# Patient Record
Sex: Male | Born: 1950 | Race: White | Hispanic: No | Marital: Married | Smoking: Never smoker
Health system: Southern US, Community
[De-identification: ages and names within clinical notes are randomized; demographics above are authoritative.]

## PROBLEM LIST (undated history)

## (undated) DIAGNOSIS — M549 Dorsalgia, unspecified: Secondary | ICD-10-CM

## (undated) DIAGNOSIS — E119 Type 2 diabetes mellitus without complications: Secondary | ICD-10-CM

## (undated) DIAGNOSIS — N289 Disorder of kidney and ureter, unspecified: Secondary | ICD-10-CM

## (undated) DIAGNOSIS — C801 Malignant (primary) neoplasm, unspecified: Secondary | ICD-10-CM

## (undated) DIAGNOSIS — T751XXA Unspecified effects of drowning and nonfatal submersion, initial encounter: Secondary | ICD-10-CM

## (undated) DIAGNOSIS — Z9889 Other specified postprocedural states: Secondary | ICD-10-CM

## (undated) DIAGNOSIS — R112 Nausea with vomiting, unspecified: Secondary | ICD-10-CM

## (undated) DIAGNOSIS — N179 Acute kidney failure, unspecified: Secondary | ICD-10-CM

## (undated) DIAGNOSIS — E785 Hyperlipidemia, unspecified: Secondary | ICD-10-CM

## (undated) DIAGNOSIS — I10 Essential (primary) hypertension: Secondary | ICD-10-CM

## (undated) DIAGNOSIS — R351 Nocturia: Secondary | ICD-10-CM

## (undated) DIAGNOSIS — M2550 Pain in unspecified joint: Secondary | ICD-10-CM

## (undated) DIAGNOSIS — Z77090 Contact with and (suspected) exposure to asbestos: Secondary | ICD-10-CM

## (undated) DIAGNOSIS — I499 Cardiac arrhythmia, unspecified: Secondary | ICD-10-CM

## (undated) DIAGNOSIS — I4891 Unspecified atrial fibrillation: Secondary | ICD-10-CM

## (undated) DIAGNOSIS — M1990 Unspecified osteoarthritis, unspecified site: Secondary | ICD-10-CM

## (undated) HISTORY — PX: SALIVARY GLAND SURGERY: SHX768

## (undated) HISTORY — PX: OTHER SURGICAL HISTORY: SHX169

## (undated) HISTORY — PX: KNEE ARTHROSCOPY: SUR90

## (undated) HISTORY — PX: JOINT REPLACEMENT: SHX530

## (undated) HISTORY — DX: Hyperlipidemia, unspecified: E78.5

## (undated) HISTORY — DX: Disorder of kidney and ureter, unspecified: N28.9

## (undated) HISTORY — DX: Unspecified atrial fibrillation: I48.91

## (undated) HISTORY — DX: Cardiac arrhythmia, unspecified: I49.9

## (undated) HISTORY — PX: ELBOW SURGERY: SHX618

## (undated) HISTORY — DX: Type 2 diabetes mellitus without complications: E11.9

## (undated) HISTORY — PX: KNEE SURGERY: SHX244

## (undated) HISTORY — PX: HERNIA REPAIR: SHX51

## (undated) HISTORY — PX: COLONOSCOPY: SHX174

## (undated) HISTORY — PX: HAND SURGERY: SHX662

## (undated) HISTORY — PX: CARPAL TUNNEL RELEASE: SHX101

## (undated) HISTORY — DX: Essential (primary) hypertension: I10

---
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
## 1973-02-03 DIAGNOSIS — N179 Acute kidney failure, unspecified: Secondary | ICD-10-CM

## 1973-02-03 DIAGNOSIS — T751XXA Unspecified effects of drowning and nonfatal submersion, initial encounter: Secondary | ICD-10-CM

## 1973-02-03 HISTORY — DX: Acute kidney failure, unspecified: N17.9

## 1973-02-03 HISTORY — DX: Unspecified effects of drowning and nonfatal submersion, initial encounter: T75.1XXA

## 1973-02-03 HISTORY — PX: OTHER SURGICAL HISTORY: SHX169

## 1979-02-04 HISTORY — PX: VASECTOMY: SHX75

## 1997-05-16 ENCOUNTER — Ambulatory Visit (HOSPITAL_COMMUNITY): Admission: RE | Admit: 1997-05-16 | Discharge: 1997-05-16 | Payer: Self-pay | Admitting: Orthopaedic Surgery

## 1997-05-24 ENCOUNTER — Inpatient Hospital Stay (HOSPITAL_COMMUNITY): Admission: RE | Admit: 1997-05-24 | Discharge: 1997-05-25 | Payer: Self-pay | Admitting: Orthopaedic Surgery

## 1999-02-28 ENCOUNTER — Inpatient Hospital Stay (HOSPITAL_COMMUNITY): Admission: RE | Admit: 1999-02-28 | Discharge: 1999-03-04 | Payer: Self-pay | Admitting: Orthopaedic Surgery

## 1999-03-20 ENCOUNTER — Encounter: Admission: RE | Admit: 1999-03-20 | Discharge: 1999-04-24 | Payer: Self-pay | Admitting: Orthopaedic Surgery

## 1999-05-08 ENCOUNTER — Encounter: Admission: RE | Admit: 1999-05-08 | Discharge: 1999-08-06 | Payer: Self-pay | Admitting: *Deleted

## 2000-07-31 ENCOUNTER — Encounter: Payer: Self-pay | Admitting: Orthopaedic Surgery

## 2000-07-31 ENCOUNTER — Encounter: Admission: RE | Admit: 2000-07-31 | Discharge: 2000-07-31 | Payer: Self-pay | Admitting: Orthopaedic Surgery

## 2000-09-02 ENCOUNTER — Ambulatory Visit (HOSPITAL_COMMUNITY): Admission: RE | Admit: 2000-09-02 | Discharge: 2000-09-02 | Payer: Self-pay | Admitting: Orthopaedic Surgery

## 2000-09-02 ENCOUNTER — Encounter: Payer: Self-pay | Admitting: Orthopaedic Surgery

## 2000-09-03 ENCOUNTER — Encounter (INDEPENDENT_AMBULATORY_CARE_PROVIDER_SITE_OTHER): Payer: Self-pay | Admitting: *Deleted

## 2000-09-03 ENCOUNTER — Inpatient Hospital Stay (HOSPITAL_COMMUNITY): Admission: RE | Admit: 2000-09-03 | Discharge: 2000-09-06 | Payer: Self-pay | Admitting: Orthopaedic Surgery

## 2000-09-17 ENCOUNTER — Encounter: Admission: RE | Admit: 2000-09-17 | Discharge: 2000-12-16 | Payer: Self-pay | Admitting: Orthopaedic Surgery

## 2002-06-03 ENCOUNTER — Encounter: Admission: RE | Admit: 2002-06-03 | Discharge: 2002-06-03 | Payer: Self-pay | Admitting: Orthopaedic Surgery

## 2004-11-15 ENCOUNTER — Encounter: Admission: RE | Admit: 2004-11-15 | Discharge: 2005-02-13 | Payer: Self-pay | Admitting: Orthopaedic Surgery

## 2006-03-10 ENCOUNTER — Encounter: Payer: Self-pay | Admitting: Pulmonary Disease

## 2006-10-01 ENCOUNTER — Emergency Department (HOSPITAL_COMMUNITY): Admission: EM | Admit: 2006-10-01 | Discharge: 2006-10-01 | Payer: Self-pay | Admitting: Emergency Medicine

## 2006-10-16 ENCOUNTER — Emergency Department (HOSPITAL_COMMUNITY): Admission: EM | Admit: 2006-10-16 | Discharge: 2006-10-16 | Payer: Self-pay | Admitting: Emergency Medicine

## 2006-10-19 ENCOUNTER — Encounter: Admission: RE | Admit: 2006-10-19 | Discharge: 2006-11-02 | Payer: Self-pay | Admitting: Orthopaedic Surgery

## 2007-03-02 HISTORY — PX: COLONOSCOPY: SHX174

## 2008-02-04 DIAGNOSIS — C801 Malignant (primary) neoplasm, unspecified: Secondary | ICD-10-CM

## 2008-02-04 HISTORY — DX: Malignant (primary) neoplasm, unspecified: C80.1

## 2008-06-23 ENCOUNTER — Ambulatory Visit: Payer: Self-pay | Admitting: Pulmonary Disease

## 2008-06-23 DIAGNOSIS — E1169 Type 2 diabetes mellitus with other specified complication: Secondary | ICD-10-CM | POA: Insufficient documentation

## 2008-06-23 DIAGNOSIS — E785 Hyperlipidemia, unspecified: Secondary | ICD-10-CM | POA: Insufficient documentation

## 2008-06-23 DIAGNOSIS — N289 Disorder of kidney and ureter, unspecified: Secondary | ICD-10-CM | POA: Insufficient documentation

## 2008-06-23 DIAGNOSIS — I10 Essential (primary) hypertension: Secondary | ICD-10-CM | POA: Insufficient documentation

## 2008-06-23 DIAGNOSIS — I480 Paroxysmal atrial fibrillation: Secondary | ICD-10-CM | POA: Insufficient documentation

## 2008-06-23 DIAGNOSIS — Z77090 Contact with and (suspected) exposure to asbestos: Secondary | ICD-10-CM | POA: Insufficient documentation

## 2008-10-18 ENCOUNTER — Telehealth: Payer: Self-pay | Admitting: Pulmonary Disease

## 2008-10-30 ENCOUNTER — Ambulatory Visit: Payer: Self-pay | Admitting: Pulmonary Disease

## 2009-01-16 ENCOUNTER — Telehealth (INDEPENDENT_AMBULATORY_CARE_PROVIDER_SITE_OTHER): Payer: Self-pay | Admitting: *Deleted

## 2009-06-22 ENCOUNTER — Ambulatory Visit: Payer: Self-pay | Admitting: Pulmonary Disease

## 2009-06-25 ENCOUNTER — Telehealth: Payer: Self-pay | Admitting: Pulmonary Disease

## 2010-03-05 NOTE — Progress Notes (Signed)
Summary: results  Phone Note Call from Patient Call back at [phone]   Caller: Patient Call For: Lady Wisham Summary of Call: calling for xray results pls call after 9:00 Initial call taken by: Rickard Patience,  Jun 25, 2009 8:10 AM  Follow-up for Phone Call        pt advised. Carron Curie CMA  Jun 25, 2009 9:27 AM

## 2010-03-05 NOTE — Assessment & Plan Note (Signed)
Summary: rov for pulmonary asbestosis   Copy to:  Rudi Heap Primary Provider/Referring Provider:  Robley Fries  CC:  Yearly followup.  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  He is doing well from a breathing standpoint, and denies any change in his exercise tolerance.  He has minimal cough, no mucus, and no congestion.  He is eating well, and has not had any unexplained weight loss.  Current Medications (verified): 1)  Crestor 5 Mg Tabs (Rosuvastatin Calcium) .... Take 1/2 Tab By Mouth Once Daily 2)  Warfarin Sodium 2.5 Mg Tabs (Warfarin Sodium) .... Take 1 Tablet By Mouth Once A Day 3)  Sotalol Hcl 80 Mg Tabs (Sotalol Hcl) .... Take 1/2 Tab By Mouth Two Times A Day 4)  Diltiazem Hcl Cr 180 Mg Xr24h-Cap (Diltiazem Hcl) .[name].. 1 Once Daily 5)  Co Q-10 150 Mg Caps (Coenzyme Q10) .... Take 1 Tablet By Mouth Once A Day 6)  Cinnamon 500 Mg Caps (Cinnamon) .... Take 1 Tablet By Mouth Once A Day 7)  Multivitamins  Tabs (Multiple Vitamin) .... Take 1 Tablet By Mouth Once A Day 8)  Viagra 100 Mg Tabs (Sildenafil Citrate) .... As Needed  Allergies (verified): No Known Drug Allergies  Review of Systems       The patient complains of non-productive cough.  The patient denies shortness of breath with activity, shortness of breath at rest, productive cough, coughing up blood, chest pain, irregular heartbeats, acid heartburn, indigestion, loss of appetite, weight change, abdominal pain, difficulty swallowing, sore throat, tooth/dental problems, headaches, nasal congestion/difficulty breathing through nose, sneezing, itching, ear ache, anxiety, depression, hand/feet swelling, joint stiffness or pain, rash, change in color of mucus, and fever.    Vital Signs:  Patient profile:   60 year old male Weight:      195 pounds BMI:     26.54 O2 Sat:      97 % on Room air Temp:     98.1 degrees F  oral Pulse rate:   68 / minute BP sitting:   112 / 70  (left arm)  Vitals Entered By: Vernie Murders (Jun 22, 2009 3:07 PM)  O2 Flow:  Room air  Physical Exam  General:  wd male in nad Lungs:  totally clear to auscultation Heart:  rrr, no mrg Extremities:  no edema noted, no cyanosis Neurologic:  alert and oriented, moves all 4.   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  Will check cxr today, and let him know the results.  He will f/u with me in one year, or sooner for worsening symptoms.  Medications Added to Medication List This Visit: 1)  Diltiazem Hcl Cr 180 Mg Xr24h-cap (Diltiazem hcl) .[name].. 1 once daily  Other Orders: Est. Patient Level II (91478) T-2 View CXR (71020TC)  Patient Instructions: 1)  will check cxr today and let you know the results 2)  followup with me in one year, but call if something changes.

## 2010-06-21 ENCOUNTER — Ambulatory Visit: Payer: Self-pay | Admitting: Pulmonary Disease

## 2010-06-21 NOTE — Op Note (Signed)
[place]. [facility]  Patient:    Christopher Avery                       MRN: [id number] Proc. Date: 02/28/99 Adm. Date:  [id number] Attending:  Randolm Idol                           Operative Report  PREOPERATIVE DIAGNOSIS:  Osteoarthritis left knee, status post high tibial osteotomy.  POSTOPERATIVE DIAGNOSIS:  Osteoarthritis left knee, status post high tibial osteotomy.  PROCEDURE: 1. Removal of internal fixation plate. 2. Total knee replacement.  SURGEON:  Claude Manges. Cleophas Dunker, M.D.  ASSISTANT:  Arnoldo Morale, P.A.-C.  ANESTHESIA:  General orotracheal.  COMPLICATIONS:  None.  COMPONENTS:  Standard plus [place] femoral component, large rotating tibial platform and 10 mm polyethylene bridging bearing - cemented and cemented rotating Cruciform backed patella.  PROCEDURE:  The patient comfortable on the operating table and under general orotracheal anesthesia, the left lower extremity was placed in a thigh tourniquet. The leg was then prepped with a Duraprep from the thigh tourniquet to the ankle. Sterile draping was performed.  The previous midline longitudinal incision was utilized and extended proximally  over the patella and by sharp dissection carried down to subcutaneous tissue. he first layer of capsule was incised in the midline.  A medial parapatellar incision was then made with the Bovie.   There was probably 10 cc of clear yellow joint effusion.  The patellar tendon was somewhat scarred to the previous osteotomy site and it as minimally elevated so that I could evert the patella 180 degrees.  The knee was  then flexed 90 degrees.  There were large osteophytes off the medial and lateral femoral condyle and to some extent off the patella.  There was almost complete absence of articular cartilage with exposed subchondral bone along the medial compartment.  Both on the tibia, as well as, the femur.  Preoperatively, I had  templated a standard plus or a large femoral component. he standard plus component was confirmed intraoperatively.  The tibia measured either a standard plus or a large and a large was confirmed intraoperatively.  The appropriate jigs were then applied to the tibia to remove probably 4-5 mm of bone with a posterior inclination of about 5 degrees.  I did not encounter the internal fixation plate at that point.  The femoral jigs were then applied using 10 mm flexion/extension gaps, which were symmetrical.  Both ACL and PCL were sacrificed and a 4 degree distal femoral valgus cut was also utilized.  The finishing jig was applied to obtain the appropriate cuts to finish to apply the femoral component.  Retractors then placed behind the tibia.  There were osteophytes along the medial tibial plateau, which were removed with a rongeur and we had templated a large tibial tray.  As we were making the center cut, we encountered the screws from he internal fixation plate status post high tibial osteotomy and accordingly removed the plate and four screws through a lateral fascial incision.  This is performed without difficulty.  Further cuts were then made for the tibia.  The trial components were then inserted, including the large rotating tibial platform with a 10 mm bridging bearing and the standard plus femoral component. We had full extension, slight hyperextension, we could flex probably 125 degrees. The tibial component rotated in the midline without any malrotation.  There was no  opening with a varus or valgus stress.  The patella was then prepared by removing 10 mm of bone, leaving approximately 14 mm of patella thickness.  The cruciate backed jig was applied and the cruciate bone trough made to accept the patella.  We applied the patella, it fit flushly and then it was reduced.  The knee was placed through a full range of motion, there was no lateral subluxation.  All  the trial components were removed, the joint was then copiously irrigated with jet saline lavage and antibiotic solution.  Retractors were then placed about the tibia, the tibia was secured with poly methyl methacrylate, followed by the 10 m bridging bearing.  We press-fit the femur, placed the knee in full extension. Extraneous methacrylate was removed.  The patella was applied with cement and clamped until the cement had hardened.  Extraneous methacrylate was removed about the patella.  The patella clamp was removed after maturation of the methacrylate.  The knee was inspected without evidence of loose material.  It was again lavage with jet saline and antibiotic solution.  The tourniquet was deflated.  Gross bleeders were Bovie coagulated.  I felt we had a nice dry field at the end of the procedure and therefore, a Hemovac was not inserted.  The fasciotomy site laterally was closed with a running 0 Ethibond.   The medial capsule incision was closed with interrupted #1 Ethibond.  The first layer of capsule was closed with a running 0 Vicryl and the subcu closed with a running 2-0 Vicryl.  The skin was closed with skin clips.  Sterile bulky dressing was applied followed by an Ace bandage and  knee immobilizer.  There was excellent capillary refill to the toes.  Dr. Katrinka Blazing was to insert an epidural catheter for postoperative pain control. DD:  02/28/99 TD:  02/28/99 Job: [id number]

## 2010-06-21 NOTE — Op Note (Signed)
[place]. [facility]  Patient:    BERTHEL, BAGNALL                     MRN: [id number] Proc. Date: 09/03/00 Attending:  Claude Manges. Cleophas Dunker, M.D.                           Operative Report  PREOPERATIVE DIAGNOSIS:  Painful left total knee replacement.  POSTOPERATIVE DIAGNOSIS:  Painful left total knee replacement with probable painful femoral component.  OPERATION PERFORMED:  Revision of femoral component of left total knee replacement and synovectomy.  SURGEON:  Claude Manges. Cleophas Dunker, M.D.  ASSISTANT: 1. Georgena Spurling, M.D. 2. Jamelle Rushing, P.A.  ANESTHESIA:  General orotracheal.  COMPLICATIONS:  None.  DESCRIPTION OF PROCEDURE:  With the patient comfortable on the operating table and under general orotracheal anesthesia, the left lower extremity was placed in a thigh tourniquet.  The leg was then prepped with Betadine scrub and then DuraPrep from the tourniquet to the midfoot.  Sterile draping was performed. With the extremity still elevated, it was Esmarch exsanguinated with the proximal tourniquet 350 mmHg.  The previous incision was utilized by sharp dissection and carried down to subcutaneous tissues.  The first layer of capsule was incised in the midline. A medial parapatellar incision was then performed through the old incision and the previously inserted Tycron sutures were removed.  There was probably 15 to 20 cc of clear joint effusion.  There was quite a bit of scar tissue in the superior pouch.  This was released so that I could evert the patella 90 degrees.  The knee was then flexed 90 degrees.  There was really minimal synovitis showing no evidence of any obvious loosening or infection.  Samples of synovium were sent to pathology without evidence of acute inflammation with less than 5 white blood cells per high powered field.  I carefully removed more of the synovial tissue although it was not discolored, wasnt beefy red or gray  and there was just minimal inflammatory tissue.  We carefully probed the tibial component.  It was perfectly intact without evidence of any loosening.  There was no pannus formation or evidence of erosion of the bone.  We could place the [place] beneath the femoral component in several locations. The patient was painful along the medial femoral condyle preoperatively.  We felt that perhaps there was a fair amount of fibrous ingrowth causing the pain, so we elected to remove the femoral component. We undermined all of the edges with a thin osteotome and then removed the component without much difficulty.  There was minimal if any bone loss.  We decided to apply a new standard plus femoral component which is the size of the previously inserted component with methacrylate.  We used the trial and applied it.  There was very minimal toggling.  Using a 10 mm bridging bearing, we had good flexion and extension gaps that were symmetrical.  The joint was then copiously irrigated with jet saline lavage and antibiotic solution.  We then applied the new femoral component with polymethyl methacrylate after switching the polyethylene components using a new component on the tibia.  The extraneous methacrylate was removed with the knee placed in extension.  We did not feel we had lost any varus or valgus.  After complete maturation of methacrylate, a few areas of extraneous methacrylate were removed with an osteotome.  The knee was then placed  through a full range of motion. The patella tracked in the midline.  There was no opening to varus or valgus stress.  The tourniquet was deflated.  Bleeders were Bovie coagulated.  The Hemovac was inserted.  The deep capsule was closed with #1 Tycron. Superficial capsule closed with a running 0 Vicryl, the subcutaneous with 2-0 Vicryl and skin closed with skin clips.  The Hemovac was charged.  A sterile bulky dressing was applied, followed by an Ace bandage and a  knee immobilizer.   The patient was to receive a femoral nerve block postoperatively. DD:  09/03/00 TD:  09/03/00 Job: [id number]

## 2010-06-21 NOTE — Discharge Summary (Signed)
[place]. [facility]  Patient:    Christopher Avery                       MRN: [id number] Adm. Date:  [id number] Disc. Date: [id number] Attending:  Randolm Idol                           Discharge Summary  FINAL DIAGNOSES:  1. Osteoarthritis, left knee.  2. History of osteoarthritis of right knee, status post successful high     tibial osteotomy.  3. Gastroesophageal reflux disease.  4. 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  5. History of renal failure secondary to Streptococcal infection in 1975.  OPERATION/PROCEDURE:  On February 28, 1999 the patient underwent removal of internal fixation plate, left tibia status post high tibial osteotomy and total knee replacement.  COMPLICATIONS:  None.  CONSULTATIONS:  None.  HISTORY OF PRESENT ILLNESS:  This is a 60 year old white male who underwent high tibial osteotomy to his right knee in 1999 for medial compartment arthrosis.  He did well for several months but for the past nine months has had progressive pain to the knee to the point of compromise.  He has had a second opinion confirming the need for total knee replacement and thus is now admitted for that particular procedure.  HOSPITAL COURSE: The day of admission Mr. Luecke was taken to the operating room and underwent successful and uncomplicated left total knee replacement. The plate and screws in the previous osteotomy were removed without difficulty.  Postoperatively Mr. Trimmer did quite well.  An epidural catheter was inserted and he was able to gain considerable motion with the CPM machine such that he was able to flex his knee approximately 70 degrees within two days.  The epidural catheter was removed on postoperative day #2.  He continued to do well and progressed to the point that he was independent with his walker at the time of discharge on March 04, 1999.  He was afebrile throughout his hospital stay and vital  signs remained stable.  He denied shortness of breath, chest pain, or calf pain.  His left knee incision was clean and dry without evidence of infection.  DISCHARGE MEDICATIONS:  1. Percocet for pain.  2. Coumadin 5 mg.  DISPOSITION: The INR was to be determined weekly through the pharmacy department.  He was to receive home health physical therapy with a home health nurse and return to see me in the office in approximately two weeks. Prescriptions were also given for a three-in-one commode seat as well as the home health physical therapy.  He did receive a rehabilitation consultation but he had progressed so quickly that inpatient rehabilitation was not necessary.  LABORATORY DATA:  He had a noted abnormal EKG with left axis deviation, otherwise within normal limits.  Hemoglobin on February 26, 1999 was 14.4 and at the time of discharge was 11.3 with hematocrit of 32.2.  INR was 2.1.  CMET was within normal limits.  Urine was clear. DD:  04/28/99 TD:  04/29/99 Job: [id number]
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
## 2010-06-21 NOTE — Discharge Summary (Signed)
[place]. [facility]  Patient:    FUE, CERVENKA                      MRN: [id number] Adm. Date:  [id number] Disc. Date: [id number] Attending:  Randolm Idol Dictator:   Jamelle Rushing, P.A. CC:         Ernestina Penna, M.D., [place], [place]   Discharge Summary  ADMISSION DIAGNOSES: 1. 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 2. History of acute renal failure.  DISCHARGE DIAGNOSIS:  Revision of left knee femoral component of total knee arthroplasty.  HISTORY OF PRESENT ILLNESS:  The patient is a 60 year old, white male with a history of left total knee arthroplasty in January of 2001.  The patient has been having constant pain and swelling in his knee for about the last year. It has been worse over the last six to eight months.  The pain is located over the medial joint line in the femoral condyle.  It is a sharp shooting pain and radiates up the leg up into the upper body region.  The patient denies any specific injuries to the knee.  ALLERGIES:  No known drug allergies.  MEDICATIONS:  Vicodin p.r.n.  SURGICAL PROCEDURES:  On September 03, 2000, the patient was taken to the OR by Claude Manges. Cleophas Dunker, M.D., assisted by Georgena Spurling, M.D., and Jamelle Rushing, P.A.  Under general anesthesia, the patient underwent a revision of the femoral component of his left total knee arthroplasty and a synovectomy.  The patient tolerated this procedure well.  One Hemovac drain was left in place. The patient did receive a postoperative femoral nerve block for pain management.  CONSULTS:  On September 03, 2000, the following routine consults were requested: Physical therapy, occupational therapy, care management, and pharmacy for routine dosing of Coumadin for DVT prophylaxis.  HOSPITAL COURSE:  On September 03, 2000, the patient was admitted to [facility]. [facility] under the care of Claude Manges. Cleophas Dunker, M.D.  He was taken to the OR where a left  total knee arthroplasty, revision of the femoral component, and synovectomy were performed without any complications.  The patient then incurred a three-day postoperative course in which he progressed very nicely with physical therapy.  His pain was initially controlled very nicely with a PCA and he was transferred over to p.o. medications on postoperative day #2 and this managed his pain very well.  The patient had progressed very nicely with physical therapy and his wound remained benign. His vital signs were stable.  He was discharged on postoperative day #3 in excellent condition.  LABORATORY DATA:  Synovium sent from left knee intraoperatively pathology shows synovium of left knee biopsy reactive changes and less than 5 polymorphonuclear cells per high-power field.  The EKG on admission was normal sinus rhythm with sinus arrhythmia at 68 beats per minute.  The CBC on September 06, 2000, showed WBC 6.5, hemoglobin 12.3, hematocrit 35.4, and platelets 202.  Coagulation studies on September 06, 2000, showed PT 20.4 with a 2.1 INR.  Routine chemistries on September 06, 2000, where all within normal limits.  The urinalysis on admission was normal.  Wound cultures for aerobic and anaerobic showed no growth.  MEDICATIONS ON ORTHOPEDICS FLOOR: 1. Colace 100 mg p.o. b.i.d. 2. Oxycodone 10 mg p.o. q.12h. 3. Laxative or enema of choice p.r.n. 4. Reglan 10 mg p.o. q.8h. p.r.n. 5. Tylox 5 mg one or two tablets every four to  six hours p.r.n.    breakthrough pain. 6. Tylenol 650 mg p.o. q.4h. p.r.n. 7. Robaxin 500 mg p.o. q.6-8h. p.r.n. 8. Restoril 30 mg p.o. q.h.s. p.r.n. 9. Coumadin 2.5 mg p.o. q.d.  DISCHARGE INSTRUCTIONS: 1. Medications:  Percocet one or two tablets every four to six hours for pain    if needed, OxyContin CR 10 mg one tablet every 12 hours, Coumadin 2.5 mg a    day unless told differently by [employer]. 2. Activity:  Partial weightbearing 50% with the use of crutches. 3.  Diet:  No restrictions. 4. Wound care:  Change dressing daily or as needed.  Check for infection. 5. Special instructions:  [facility] for home health pro time and Coumadin    adjustments.  First pro time on Tuesday, September 08, 2000.  Follow up with    Claude Manges. Cleophas Dunker, M.D., in one week from discharge.  CONDITION ON DISCHARGE:  Excellent. DD:  09/13/00 TD:  09/13/00 Job: [id number]

## 2010-06-25 ENCOUNTER — Encounter: Payer: Self-pay | Admitting: Pulmonary Disease

## 2010-07-03 ENCOUNTER — Ambulatory Visit (INDEPENDENT_AMBULATORY_CARE_PROVIDER_SITE_OTHER): Payer: Worker's Compensation | Admitting: Pulmonary Disease

## 2010-07-03 ENCOUNTER — Ambulatory Visit (INDEPENDENT_AMBULATORY_CARE_PROVIDER_SITE_OTHER)
Admission: RE | Admit: 2010-07-03 | Discharge: 2010-07-03 | Disposition: A | Discharge status: Home / Self Care | Payer: Worker's Compensation | Source: Ambulatory Visit | Attending: Pulmonary Disease | Admitting: Pulmonary Disease

## 2010-07-03 ENCOUNTER — Encounter: Payer: Self-pay | Admitting: Pulmonary Disease

## 2010-07-03 VITALS — BP 120/80 | HR 56 | Temp 97.8°F | Ht 72.0 in | Wt 177.2 lb

## 2010-07-03 DIAGNOSIS — J61 Pneumoconiosis due to asbestos and other mineral fibers: Secondary | ICD-10-CM

## 2010-07-03 NOTE — Patient Instructions (Signed)
Will check cxr today, and will call you with results. followup with me in one year.

## 2010-07-03 NOTE — Progress Notes (Signed)
  Subjective:    [name]: Christopher Avery, male    DOB: 1950-07-19, 60 y.o.   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  He has no pulmonary symptoms currently, denying cough/congestion/mucus/dyspnea.  He feels his exertional tolerance is excellent.  He denies pleuritic cp, and has had no weight loss or anorexia.    Review of Systems  Constitutional: Negative for fever and unexpected weight change.  HENT: Negative for ear pain, nosebleeds, congestion, sore throat, rhinorrhea, sneezing, trouble swallowing, dental problem, postnasal drip and sinus pressure.   Eyes: Negative for redness and itching.  Respiratory: Negative for cough, chest tightness, shortness of breath and wheezing.   Cardiovascular: Negative for palpitations and leg swelling.  Gastrointestinal: Negative for nausea and vomiting.  Genitourinary: Negative for dysuria.  Musculoskeletal: Negative for joint swelling.  Skin: Negative for rash.  Neurological: Negative for headaches.  Hematological: Bruises/bleeds easily.  Psychiatric/Behavioral: Negative for dysphoric mood. The patient is not nervous/anxious.        Objective:   Physical Exam Wd male in nad Chest clear to auscultation, no wheezing Cor with rrr LE without edema, no cyanosis  Alert, moves all 4        Assessment & Plan:

## 2010-07-06 NOTE — Assessment & Plan Note (Signed)
The pt remains asymptomatic from a pulmonary standpoint.  He is due for his yearly cxr, and sometime in the future will recheck pfts for completeness.  He is to call if he develops any constitutional symptoms or pleuritic cp.  F/u will be in one year.

## 2011-02-10 DIAGNOSIS — I4891 Unspecified atrial fibrillation: Secondary | ICD-10-CM | POA: Diagnosis not present

## 2011-02-10 DIAGNOSIS — Z7901 Long term (current) use of anticoagulants: Secondary | ICD-10-CM | POA: Diagnosis not present

## 2011-02-11 DIAGNOSIS — R7309 Other abnormal glucose: Secondary | ICD-10-CM | POA: Diagnosis not present

## 2011-03-10 DIAGNOSIS — Z7901 Long term (current) use of anticoagulants: Secondary | ICD-10-CM | POA: Diagnosis not present

## 2011-03-10 DIAGNOSIS — I4891 Unspecified atrial fibrillation: Secondary | ICD-10-CM | POA: Diagnosis not present

## 2011-03-13 DIAGNOSIS — J019 Acute sinusitis, unspecified: Secondary | ICD-10-CM | POA: Diagnosis not present

## 2011-03-19 DIAGNOSIS — Z7901 Long term (current) use of anticoagulants: Secondary | ICD-10-CM | POA: Diagnosis not present

## 2011-03-19 DIAGNOSIS — I4891 Unspecified atrial fibrillation: Secondary | ICD-10-CM | POA: Diagnosis not present

## 2011-04-10 DIAGNOSIS — J019 Acute sinusitis, unspecified: Secondary | ICD-10-CM | POA: Diagnosis not present

## 2011-04-11 DIAGNOSIS — I4891 Unspecified atrial fibrillation: Secondary | ICD-10-CM | POA: Diagnosis not present

## 2011-04-11 DIAGNOSIS — Z7901 Long term (current) use of anticoagulants: Secondary | ICD-10-CM | POA: Diagnosis not present

## 2011-04-14 DIAGNOSIS — D235 Other benign neoplasm of skin of trunk: Secondary | ICD-10-CM | POA: Diagnosis not present

## 2011-04-14 DIAGNOSIS — D485 Neoplasm of uncertain behavior of skin: Secondary | ICD-10-CM | POA: Diagnosis not present

## 2011-04-14 DIAGNOSIS — C4359 Malignant melanoma of other part of trunk: Secondary | ICD-10-CM | POA: Diagnosis not present

## 2011-04-24 DIAGNOSIS — C4359 Malignant melanoma of other part of trunk: Secondary | ICD-10-CM | POA: Diagnosis not present

## 2011-04-24 DIAGNOSIS — D485 Neoplasm of uncertain behavior of skin: Secondary | ICD-10-CM | POA: Diagnosis not present

## 2011-05-16 DIAGNOSIS — Z7901 Long term (current) use of anticoagulants: Secondary | ICD-10-CM | POA: Diagnosis not present

## 2011-05-16 DIAGNOSIS — I4891 Unspecified atrial fibrillation: Secondary | ICD-10-CM | POA: Diagnosis not present

## 2011-05-28 DIAGNOSIS — M1710 Unilateral primary osteoarthritis, unspecified knee: Secondary | ICD-10-CM | POA: Diagnosis not present

## 2011-05-28 DIAGNOSIS — Z79899 Other long term (current) drug therapy: Secondary | ICD-10-CM | POA: Diagnosis not present

## 2011-05-28 DIAGNOSIS — N529 Male erectile dysfunction, unspecified: Secondary | ICD-10-CM | POA: Diagnosis not present

## 2011-05-28 DIAGNOSIS — I10 Essential (primary) hypertension: Secondary | ICD-10-CM | POA: Diagnosis not present

## 2011-05-28 DIAGNOSIS — J309 Allergic rhinitis, unspecified: Secondary | ICD-10-CM | POA: Diagnosis not present

## 2011-05-28 DIAGNOSIS — E7800 Pure hypercholesterolemia, unspecified: Secondary | ICD-10-CM | POA: Diagnosis not present

## 2011-05-28 DIAGNOSIS — R7309 Other abnormal glucose: Secondary | ICD-10-CM | POA: Diagnosis not present

## 2011-05-28 DIAGNOSIS — B009 Herpesviral infection, unspecified: Secondary | ICD-10-CM | POA: Diagnosis not present

## 2011-05-28 DIAGNOSIS — IMO0002 Reserved for concepts with insufficient information to code with codable children: Secondary | ICD-10-CM | POA: Diagnosis not present

## 2011-05-28 DIAGNOSIS — I4891 Unspecified atrial fibrillation: Secondary | ICD-10-CM | POA: Diagnosis not present

## 2011-06-16 DIAGNOSIS — I4891 Unspecified atrial fibrillation: Secondary | ICD-10-CM | POA: Diagnosis not present

## 2011-06-16 DIAGNOSIS — Z7901 Long term (current) use of anticoagulants: Secondary | ICD-10-CM | POA: Diagnosis not present

## 2011-07-03 ENCOUNTER — Ambulatory Visit (INDEPENDENT_AMBULATORY_CARE_PROVIDER_SITE_OTHER)
Admission: RE | Admit: 2011-07-03 | Discharge: 2011-07-03 | Disposition: A | Discharge status: Home / Self Care | Payer: Worker's Compensation | Source: Ambulatory Visit | Attending: Pulmonary Disease | Admitting: Pulmonary Disease

## 2011-07-03 ENCOUNTER — Ambulatory Visit (INDEPENDENT_AMBULATORY_CARE_PROVIDER_SITE_OTHER): Payer: Worker's Compensation | Admitting: Pulmonary Disease

## 2011-07-03 ENCOUNTER — Encounter: Payer: Self-pay | Admitting: Pulmonary Disease

## 2011-07-03 VITALS — BP 114/78 | HR 68 | Temp 97.7°F | Ht 73.0 in | Wt 169.6 lb

## 2011-07-03 DIAGNOSIS — J61 Pneumoconiosis due to asbestos and other mineral fibers: Secondary | ICD-10-CM

## 2011-07-03 NOTE — Progress Notes (Signed)
  Subjective:    [name]: Christopher Avery, male    DOB: 06-01-1950, 61 y.o.   MRN: [id number]  HPI The patient comes in today for followup of his pulmonary asbestosis.  He has been doing well from a pulmonary standpoint, and denies any shortness of breath, cough, or mucus production.  He is staying quite active, and has not had any exertional tolerance issues.  He denies any chest pain, and has been eating well with no unintended weight loss.   Review of Systems  Constitutional: Negative.  Negative for fever and unexpected weight change.  HENT: Positive for congestion, postnasal drip and sinus pressure. Negative for ear pain, nosebleeds, sore throat, rhinorrhea, sneezing, trouble swallowing and dental problem.   Eyes: Negative.  Negative for redness and itching.  Respiratory: Negative.  Negative for cough, chest tightness, shortness of breath and wheezing.   Cardiovascular: Negative.  Negative for palpitations and leg swelling.  Gastrointestinal: Negative.  Negative for nausea and vomiting.  Genitourinary: Negative.  Negative for dysuria.  Musculoskeletal: Negative.  Negative for joint swelling.  Skin: Negative.  Negative for rash.  Neurological: Negative.  Negative for headaches.  Hematological: Negative.  Does not bruise/bleed easily.  Psychiatric/Behavioral: Negative.  Negative for dysphoric mood. The patient is not nervous/anxious.        Objective:   Physical Exam Well-developed male in no acute distress Nose without purulence or discharge noted Chest totally clear to auscultation, no wheezing Cardiac exam with regular rate and rhythm Lower extremities with no edema, no cyanosis Alert and oriented, moves all 4 extremities.       Assessment & Plan:

## 2011-07-03 NOTE — Patient Instructions (Signed)
Will check cxr today, and call you with results. Will schedule for breathing studies at your convenience in the next month or so.  Please let us know what works best for your schedule. If doing well, followup with me in one year.

## 2011-07-03 NOTE — Assessment & Plan Note (Signed)
The patient has been doing well from a pulmonary standpoint, and at least subjectively does not have any pulmonary issues.  He does need to have a followup chest x-ray, and also needs to have pulmonary function studies since it has been about 3 years since this has been looked at.  The patient will followup with me in one year if everything looks good, but will call if he has any new symptoms.

## 2011-07-09 DIAGNOSIS — E119 Type 2 diabetes mellitus without complications: Secondary | ICD-10-CM | POA: Diagnosis not present

## 2011-07-09 DIAGNOSIS — N529 Male erectile dysfunction, unspecified: Secondary | ICD-10-CM | POA: Diagnosis not present

## 2011-07-09 DIAGNOSIS — H04129 Dry eye syndrome of unspecified lacrimal gland: Secondary | ICD-10-CM | POA: Diagnosis not present

## 2011-07-09 DIAGNOSIS — H1045 Other chronic allergic conjunctivitis: Secondary | ICD-10-CM | POA: Diagnosis not present

## 2011-07-21 ENCOUNTER — Ambulatory Visit (INDEPENDENT_AMBULATORY_CARE_PROVIDER_SITE_OTHER): Payer: Worker's Compensation | Admitting: Pulmonary Disease

## 2011-07-21 DIAGNOSIS — J61 Pneumoconiosis due to asbestos and other mineral fibers: Secondary | ICD-10-CM

## 2011-07-21 LAB — PULMONARY FUNCTION TEST

## 2011-07-21 NOTE — Progress Notes (Signed)
PFT done today. 

## 2011-07-22 DIAGNOSIS — I4891 Unspecified atrial fibrillation: Secondary | ICD-10-CM | POA: Diagnosis not present

## 2011-07-22 DIAGNOSIS — Z7901 Long term (current) use of anticoagulants: Secondary | ICD-10-CM | POA: Diagnosis not present

## 2011-07-24 ENCOUNTER — Telehealth: Payer: Self-pay | Admitting: Pulmonary Disease

## 2011-07-24 NOTE — Telephone Encounter (Signed)
Christopher Avery, please let pt know that his pfts are normal, and actually a little better from the last testing.  Thanks.

## 2011-07-25 NOTE — Telephone Encounter (Signed)
  Pt aware PFT is normal per [facility] and verbalized understanding.

## 2011-08-05 ENCOUNTER — Encounter: Payer: Self-pay | Admitting: Pulmonary Disease

## 2011-08-11 DIAGNOSIS — D235 Other benign neoplasm of skin of trunk: Secondary | ICD-10-CM | POA: Diagnosis not present

## 2011-08-11 DIAGNOSIS — Z85820 Personal history of malignant melanoma of skin: Secondary | ICD-10-CM | POA: Diagnosis not present

## 2011-08-19 DIAGNOSIS — Z7901 Long term (current) use of anticoagulants: Secondary | ICD-10-CM | POA: Diagnosis not present

## 2011-08-19 DIAGNOSIS — I4891 Unspecified atrial fibrillation: Secondary | ICD-10-CM | POA: Diagnosis not present

## 2011-09-17 DIAGNOSIS — Z7901 Long term (current) use of anticoagulants: Secondary | ICD-10-CM | POA: Diagnosis not present

## 2011-09-17 DIAGNOSIS — I4891 Unspecified atrial fibrillation: Secondary | ICD-10-CM | POA: Diagnosis not present

## 2011-10-20 DIAGNOSIS — I4891 Unspecified atrial fibrillation: Secondary | ICD-10-CM | POA: Diagnosis not present

## 2011-10-20 DIAGNOSIS — E782 Mixed hyperlipidemia: Secondary | ICD-10-CM | POA: Diagnosis not present

## 2011-10-20 DIAGNOSIS — I10 Essential (primary) hypertension: Secondary | ICD-10-CM | POA: Diagnosis not present

## 2011-10-28 DIAGNOSIS — M1610 Unilateral primary osteoarthritis, unspecified hip: Secondary | ICD-10-CM | POA: Diagnosis not present

## 2011-11-03 DIAGNOSIS — M169 Osteoarthritis of hip, unspecified: Secondary | ICD-10-CM | POA: Diagnosis not present

## 2011-11-03 DIAGNOSIS — M25559 Pain in unspecified hip: Secondary | ICD-10-CM | POA: Diagnosis not present

## 2011-11-07 DIAGNOSIS — E119 Type 2 diabetes mellitus without complications: Secondary | ICD-10-CM | POA: Diagnosis not present

## 2011-11-19 DIAGNOSIS — Z7901 Long term (current) use of anticoagulants: Secondary | ICD-10-CM | POA: Diagnosis not present

## 2011-11-19 DIAGNOSIS — I4891 Unspecified atrial fibrillation: Secondary | ICD-10-CM | POA: Diagnosis not present

## 2011-11-27 DIAGNOSIS — M169 Osteoarthritis of hip, unspecified: Secondary | ICD-10-CM | POA: Diagnosis not present

## 2011-12-15 DIAGNOSIS — Z23 Encounter for immunization: Secondary | ICD-10-CM | POA: Diagnosis not present

## 2011-12-15 DIAGNOSIS — I4891 Unspecified atrial fibrillation: Secondary | ICD-10-CM | POA: Diagnosis not present

## 2011-12-15 DIAGNOSIS — Z125 Encounter for screening for malignant neoplasm of prostate: Secondary | ICD-10-CM | POA: Diagnosis not present

## 2011-12-15 DIAGNOSIS — Z0000 Encounter for general adult medical examination without abnormal findings: Secondary | ICD-10-CM | POA: Diagnosis not present

## 2011-12-15 DIAGNOSIS — Z79899 Other long term (current) drug therapy: Secondary | ICD-10-CM | POA: Diagnosis not present

## 2011-12-15 DIAGNOSIS — E119 Type 2 diabetes mellitus without complications: Secondary | ICD-10-CM | POA: Diagnosis not present

## 2011-12-15 DIAGNOSIS — E7800 Pure hypercholesterolemia, unspecified: Secondary | ICD-10-CM | POA: Diagnosis not present

## 2011-12-15 DIAGNOSIS — I10 Essential (primary) hypertension: Secondary | ICD-10-CM | POA: Diagnosis not present

## 2011-12-15 DIAGNOSIS — Z1331 Encounter for screening for depression: Secondary | ICD-10-CM | POA: Diagnosis not present

## 2011-12-16 DIAGNOSIS — Z7901 Long term (current) use of anticoagulants: Secondary | ICD-10-CM | POA: Diagnosis not present

## 2011-12-16 DIAGNOSIS — I4891 Unspecified atrial fibrillation: Secondary | ICD-10-CM | POA: Diagnosis not present

## 2012-01-22 DIAGNOSIS — I4891 Unspecified atrial fibrillation: Secondary | ICD-10-CM | POA: Diagnosis not present

## 2012-01-22 DIAGNOSIS — Z7901 Long term (current) use of anticoagulants: Secondary | ICD-10-CM | POA: Diagnosis not present

## 2012-03-02 DIAGNOSIS — I4891 Unspecified atrial fibrillation: Secondary | ICD-10-CM | POA: Diagnosis not present

## 2012-03-02 DIAGNOSIS — Z7901 Long term (current) use of anticoagulants: Secondary | ICD-10-CM | POA: Diagnosis not present

## 2012-03-12 DIAGNOSIS — M25539 Pain in unspecified wrist: Secondary | ICD-10-CM | POA: Diagnosis not present

## 2012-03-12 DIAGNOSIS — G5600 Carpal tunnel syndrome, unspecified upper limb: Secondary | ICD-10-CM | POA: Diagnosis not present

## 2012-03-23 DIAGNOSIS — R209 Unspecified disturbances of skin sensation: Secondary | ICD-10-CM | POA: Diagnosis not present

## 2012-03-23 DIAGNOSIS — M79609 Pain in unspecified limb: Secondary | ICD-10-CM | POA: Diagnosis not present

## 2012-03-23 DIAGNOSIS — M25549 Pain in joints of unspecified hand: Secondary | ICD-10-CM | POA: Diagnosis not present

## 2012-03-30 DIAGNOSIS — Z7901 Long term (current) use of anticoagulants: Secondary | ICD-10-CM | POA: Diagnosis not present

## 2012-03-30 DIAGNOSIS — I4891 Unspecified atrial fibrillation: Secondary | ICD-10-CM | POA: Diagnosis not present

## 2012-04-02 DIAGNOSIS — M25549 Pain in joints of unspecified hand: Secondary | ICD-10-CM | POA: Diagnosis not present

## 2012-04-02 DIAGNOSIS — R209 Unspecified disturbances of skin sensation: Secondary | ICD-10-CM | POA: Diagnosis not present

## 2012-04-02 DIAGNOSIS — M79609 Pain in unspecified limb: Secondary | ICD-10-CM | POA: Diagnosis not present

## 2012-04-15 DIAGNOSIS — G5600 Carpal tunnel syndrome, unspecified upper limb: Secondary | ICD-10-CM | POA: Diagnosis not present

## 2012-04-22 ENCOUNTER — Ambulatory Visit: Payer: Self-pay | Admitting: Cardiovascular Disease

## 2012-04-22 DIAGNOSIS — Z7901 Long term (current) use of anticoagulants: Secondary | ICD-10-CM | POA: Insufficient documentation

## 2012-04-22 DIAGNOSIS — I4891 Unspecified atrial fibrillation: Secondary | ICD-10-CM

## 2012-04-30 DIAGNOSIS — I4891 Unspecified atrial fibrillation: Secondary | ICD-10-CM | POA: Diagnosis not present

## 2012-04-30 DIAGNOSIS — Z7901 Long term (current) use of anticoagulants: Secondary | ICD-10-CM | POA: Diagnosis not present

## 2012-05-14 DIAGNOSIS — Z7901 Long term (current) use of anticoagulants: Secondary | ICD-10-CM | POA: Diagnosis not present

## 2012-05-14 DIAGNOSIS — I4891 Unspecified atrial fibrillation: Secondary | ICD-10-CM | POA: Diagnosis not present

## 2012-06-10 DIAGNOSIS — I4891 Unspecified atrial fibrillation: Secondary | ICD-10-CM | POA: Diagnosis not present

## 2012-06-10 DIAGNOSIS — Z7901 Long term (current) use of anticoagulants: Secondary | ICD-10-CM | POA: Diagnosis not present

## 2012-06-14 DIAGNOSIS — E119 Type 2 diabetes mellitus without complications: Secondary | ICD-10-CM | POA: Diagnosis not present

## 2012-06-14 DIAGNOSIS — M1710 Unilateral primary osteoarthritis, unspecified knee: Secondary | ICD-10-CM | POA: Diagnosis not present

## 2012-06-14 DIAGNOSIS — IMO0002 Reserved for concepts with insufficient information to code with codable children: Secondary | ICD-10-CM | POA: Diagnosis not present

## 2012-06-14 DIAGNOSIS — B009 Herpesviral infection, unspecified: Secondary | ICD-10-CM | POA: Diagnosis not present

## 2012-06-14 DIAGNOSIS — J309 Allergic rhinitis, unspecified: Secondary | ICD-10-CM | POA: Diagnosis not present

## 2012-06-14 DIAGNOSIS — E7800 Pure hypercholesterolemia, unspecified: Secondary | ICD-10-CM | POA: Diagnosis not present

## 2012-06-14 DIAGNOSIS — I10 Essential (primary) hypertension: Secondary | ICD-10-CM | POA: Diagnosis not present

## 2012-06-14 DIAGNOSIS — N529 Male erectile dysfunction, unspecified: Secondary | ICD-10-CM | POA: Diagnosis not present

## 2012-06-14 DIAGNOSIS — I4891 Unspecified atrial fibrillation: Secondary | ICD-10-CM | POA: Diagnosis not present

## 2012-07-02 ENCOUNTER — Encounter: Payer: Self-pay | Admitting: Pulmonary Disease

## 2012-07-02 ENCOUNTER — Ambulatory Visit (INDEPENDENT_AMBULATORY_CARE_PROVIDER_SITE_OTHER)
Admission: RE | Admit: 2012-07-02 | Discharge: 2012-07-02 | Disposition: A | Discharge status: Home / Self Care | Payer: Worker's Compensation | Source: Ambulatory Visit | Attending: Pulmonary Disease | Admitting: Pulmonary Disease

## 2012-07-02 ENCOUNTER — Ambulatory Visit (INDEPENDENT_AMBULATORY_CARE_PROVIDER_SITE_OTHER): Payer: Worker's Compensation | Admitting: Pulmonary Disease

## 2012-07-02 VITALS — BP 118/72 | HR 68 | Temp 98.7°F | Ht 72.0 in | Wt 162.2 lb

## 2012-07-02 DIAGNOSIS — J61 Pneumoconiosis due to asbestos and other mineral fibers: Secondary | ICD-10-CM

## 2012-07-02 NOTE — Progress Notes (Signed)
  Subjective:    [name]: Christopher Avery, male    DOB: 1950/05/31, 62 y.o.   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  He has had no breathing issues since last visit, no cough or congestion.  He has had no chest pain, eating well, no weight loss.   He feels well.    Review of Systems  Constitutional: Negative for fever and unexpected weight change.  HENT: Positive for sinus pressure. Negative for ear pain, nosebleeds, congestion, sore throat, rhinorrhea, sneezing, trouble swallowing, dental problem and postnasal drip.   Eyes: Negative for redness and itching.  Respiratory: Negative for cough, chest tightness, shortness of breath and wheezing.   Cardiovascular: Negative for palpitations and leg swelling.  Gastrointestinal: Negative for nausea and vomiting.  Genitourinary: Negative for dysuria.  Musculoskeletal: Negative for joint swelling.  Skin: Negative for rash.  Neurological: Positive for headaches.  Hematological: Bruises/bleeds easily.  Psychiatric/Behavioral: Negative for dysphoric mood. The patient is not nervous/anxious.        Objective:   Physical Exam Thin male in nad Nose without purulence or discharge noted. Neck without LN or tmg Chest totally clear, no crackles or wheezing Cor with rrr LE without edema, no cyanosis Alert and oriented, moves all 4.        Assessment & Plan:

## 2012-07-02 NOTE — Addendum Note (Signed)
Addended by: Ozella Almond R on: 07/02/2012 09:30 AM   Modules accepted: Orders

## 2012-07-02 NOTE — Patient Instructions (Addendum)
Will check cxr today, and call you with results. Will see you back in one year if doing well, but call if having symptoms.

## 2012-07-02 NOTE — Assessment & Plan Note (Signed)
The pt is doing well from a pulmonary standpoint, with no pulmonary issues or constitutional symptoms.  Will check cxr today to f/u, and will need pfts next year or year after.

## 2012-07-08 ENCOUNTER — Ambulatory Visit (INDEPENDENT_AMBULATORY_CARE_PROVIDER_SITE_OTHER): Payer: Medicare Other | Admitting: Pharmacist Clinician (PhC)/ Clinical Pharmacy Specialist

## 2012-07-08 VITALS — BP 118/70 | HR 68

## 2012-07-08 DIAGNOSIS — I4891 Unspecified atrial fibrillation: Secondary | ICD-10-CM | POA: Diagnosis not present

## 2012-07-08 DIAGNOSIS — Z7901 Long term (current) use of anticoagulants: Secondary | ICD-10-CM | POA: Diagnosis not present

## 2012-07-08 LAB — POCT INR: INR: 1.9

## 2012-07-21 DIAGNOSIS — L570 Actinic keratosis: Secondary | ICD-10-CM | POA: Diagnosis not present

## 2012-07-21 DIAGNOSIS — B079 Viral wart, unspecified: Secondary | ICD-10-CM | POA: Diagnosis not present

## 2012-07-21 DIAGNOSIS — D485 Neoplasm of uncertain behavior of skin: Secondary | ICD-10-CM | POA: Diagnosis not present

## 2012-07-21 DIAGNOSIS — Z85820 Personal history of malignant melanoma of skin: Secondary | ICD-10-CM | POA: Diagnosis not present

## 2012-07-29 ENCOUNTER — Ambulatory Visit (INDEPENDENT_AMBULATORY_CARE_PROVIDER_SITE_OTHER): Payer: Medicare Other | Admitting: Pharmacist

## 2012-07-29 DIAGNOSIS — Z85820 Personal history of malignant melanoma of skin: Secondary | ICD-10-CM | POA: Diagnosis not present

## 2012-07-29 DIAGNOSIS — E119 Type 2 diabetes mellitus without complications: Secondary | ICD-10-CM | POA: Insufficient documentation

## 2012-07-29 DIAGNOSIS — D485 Neoplasm of uncertain behavior of skin: Secondary | ICD-10-CM | POA: Diagnosis not present

## 2012-07-29 NOTE — Progress Notes (Signed)
HPI: Patient with type 2 DM that was diagnosed approximately 18 months ago.  His PCP is Dr. Johnella Moloney with [facility] but he has been to our office before and had heard from a friend that we could provide diabetes education.  Current diabetes regimen is tradgenta 5mg  1 tablet daily and metformin 500mg  1 tablet each morning and 2 tablets each evening.   He also takes atovastatin 10mg  1/2 tablet daily, sotalol 80mg  1/2 bid and Coenzyme Q10  100mg  daily, warfarin 2.5mg  as directed, diltiazem 180mg  1 tablet daily.  Patient does have a past history of hospitalization due to acute renal failure and required dialysis.   Per patient his last A1c was 6.9% and Scr has been normal (he thinks 0.7 was last Scr).  Past Medical History  Diagnosis Date  . Unspecified disorder of kidney and ureter   . Other and unspecified hyperlipidemia   . Unspecified essential hypertension   . Atrial fibrillation    Patient brings in HBG reading that range from 95 to 160.  Average HBG is 123.  All HBG readings are fasting in am.  Assessment: Controlled Type 2 diabetes  Plan: 1.  I did educate patient regarding diabetes pathyphysiology and health related goal for diabetic - LDL <100, Tg <150, BP <140/80 and normal microalbumin.   2. We discusses BG goals - fasting 80-130 and post prandial less than 180.  I encouraged Mr. Chisolm to check BG daily but to vary the time of day he checks so we can determine his post prandial and late in day BG control.  He will bring those reading back in 2-3 weeks.   3.  Reviewed patient's diabetes regimen.  I explained how his metformin and Tradgenta work in control his BG and what side effects for monitor for.  Specifically I discusses preventing dehydration and stressed regular checks of Scr with his history of acute renal failure.   4.  Encourage patient to call office anytime he has questions for me.   Time spent with patient = 15 minutes.   Henrene Pastor, PharmD,  CPP

## 2012-08-04 ENCOUNTER — Ambulatory Visit (INDEPENDENT_AMBULATORY_CARE_PROVIDER_SITE_OTHER): Payer: Medicare Other | Admitting: Pharmacist Clinician (PhC)/ Clinical Pharmacy Specialist

## 2012-08-04 VITALS — BP 112/72 | HR 60

## 2012-08-04 DIAGNOSIS — I4891 Unspecified atrial fibrillation: Secondary | ICD-10-CM

## 2012-08-04 DIAGNOSIS — Z7901 Long term (current) use of anticoagulants: Secondary | ICD-10-CM | POA: Diagnosis not present

## 2012-08-04 LAB — POCT INR: INR: 3.3

## 2012-08-25 ENCOUNTER — Ambulatory Visit (INDEPENDENT_AMBULATORY_CARE_PROVIDER_SITE_OTHER): Payer: Medicare Other | Admitting: Pharmacist Clinician (PhC)/ Clinical Pharmacy Specialist

## 2012-08-25 VITALS — BP 112/70 | HR 72

## 2012-08-25 DIAGNOSIS — I4891 Unspecified atrial fibrillation: Secondary | ICD-10-CM | POA: Diagnosis not present

## 2012-08-25 DIAGNOSIS — Z7901 Long term (current) use of anticoagulants: Secondary | ICD-10-CM

## 2012-09-13 ENCOUNTER — Ambulatory Visit (INDEPENDENT_AMBULATORY_CARE_PROVIDER_SITE_OTHER): Payer: Medicare Other | Admitting: Family Medicine

## 2012-09-13 ENCOUNTER — Encounter: Payer: Self-pay | Admitting: Family Medicine

## 2012-09-13 VITALS — BP 115/74 | HR 68 | Temp 97.0°F | Ht 73.0 in | Wt 160.2 lb

## 2012-09-13 DIAGNOSIS — T1590XA Foreign body on external eye, part unspecified, unspecified eye, initial encounter: Secondary | ICD-10-CM

## 2012-09-13 DIAGNOSIS — T1592XA Foreign body on external eye, part unspecified, left eye, initial encounter: Secondary | ICD-10-CM | POA: Insufficient documentation

## 2012-09-13 DIAGNOSIS — S0502XA Injury of conjunctiva and corneal abrasion without foreign body, left eye, initial encounter: Secondary | ICD-10-CM

## 2012-09-13 DIAGNOSIS — S058X9A Other injuries of unspecified eye and orbit, initial encounter: Secondary | ICD-10-CM

## 2012-09-13 DIAGNOSIS — S0500XA Injury of conjunctiva and corneal abrasion without foreign body, unspecified eye, initial encounter: Secondary | ICD-10-CM | POA: Insufficient documentation

## 2012-09-13 MED ORDER — TOBRAMYCIN 0.3 % OP SOLN: 1.0000 [drp] | OPHTHALMIC | Status: DC

## 2012-09-13 MED ORDER — HYDROCODONE-ACETAMINOPHEN 5-300 MG PO TABS: 1.0000 | ORAL_TABLET | Freq: Four times a day (QID) | ORAL | Status: DC | PRN

## 2012-09-13 NOTE — Progress Notes (Signed)
[name]: Christopher Avery, male   DOB: 1950/03/16, 62 y.o.   MRN: [id number] SUBJECTIVE: CC: Chief Complaint  Patient presents with  . Eye Injury    outside on saturday fetl like something n left eye    HPI:  Doing yard work yesterday and dust blew into the left eye. Irritated left eye in the lower outer quadrant. Vision no change.   Past Medical History  Diagnosis Date  . Unspecified disorder of kidney and ureter   . Other and unspecified hyperlipidemia   . Unspecified essential hypertension   . Atrial fibrillation    No past surgical history on file. History   Social History  . Marital Status: Married    Spouse Name: [name]    Number of Children: 2  . Years of Education: [name]   Occupational History  . Retired    Social History Main Topics  . Smoking status: Never Smoker   . Smokeless tobacco: Not on file  . Alcohol Use: Not on file  . Drug Use: Not on file  . Sexually Active: Not on file   Other Topics Concern  . Not on file   Social History Narrative  . No narrative on file   Family History  Problem Relation Age of Onset  . Emphysema Father   . Heart disease Father   . Colon cancer Paternal Grandfather    Current Outpatient Prescriptions on File Prior to Visit  Medication Sig Dispense Refill  . atorvastatin (LIPITOR) 10 MG tablet Take 5 mg by mouth daily.        [name] co-enzyme Q-10 30 MG capsule Take 100 mg by mouth daily.       [name] diltiazem (CARDIZEM CD) 180 MG 24 hr capsule Take 180 mg by mouth daily.        . metFORMIN (GLUCOPHAGE) 500 MG tablet Take 500 mg by mouth. 1 am  2 pm      . Multiple Vitamin (MULTIVITAMIN) capsule Take 1 capsule by mouth daily.        . sildenafil (VIAGRA) 100 MG tablet Take 100 mg by mouth daily as needed.        . warfarin (COUMADIN) 2.5 MG tablet Take 2.5 mg by mouth daily.        [name] linagliptin (TRADJENTA) 5 MG TABS tablet Take 5 mg by mouth daily.       No current facility-administered medications on file prior to visit.    No Known Allergies Immunization History  Administered Date(s) Administered  . Influenza Split 12/05/2010, 12/03/2011   Prior to Admission medications   Medication Sig Start Date End Date Taking? Authorizing Provider  atorvastatin (LIPITOR) 10 MG tablet Take 5 mg by mouth daily.     Yes Historical Provider, MD  co-enzyme Q-10 30 MG capsule Take 100 mg by mouth daily.    Yes Historical Provider, MD  diltiazem (CARDIZEM CD) 180 MG 24 hr capsule Take 180 mg by mouth daily.     Yes Historical Provider, MD  metFORMIN (GLUCOPHAGE) 500 MG tablet Take 500 mg by mouth. 1 am  2 pm 06/15/11  Yes Historical Provider, MD  Multiple Vitamin (MULTIVITAMIN) capsule Take 1 capsule by mouth daily.     Yes Historical Provider, MD  sildenafil (VIAGRA) 100 MG tablet Take 100 mg by mouth daily as needed.     Yes Historical Provider, MD  sotalol (BETAPACE) 80 MG tablet Take 80 mg by mouth daily.  07/06/12  Yes Historical Provider, MD  warfarin (  COUMADIN) 2.5 MG tablet Take 2.5 mg by mouth daily.     Yes Historical Provider, MD  linagliptin (TRADJENTA) 5 MG TABS tablet Take 5 mg by mouth daily.    Historical Provider, MD     ROS: As above in the HPI. All other systems are stable or negative.  OBJECTIVE: APPEARANCE:  Patient in no acute distress.The patient appeared well nourished and normally developed. Acyanotic. Waist: VITAL SIGNS:BP 115/74  Pulse 68  Temp(Src) 97 F (36.1 C) (Oral)  Ht 6\' 1"  (1.854 m)  Wt 160 lb 3.2 oz (72.666 kg)  BMI 21.14 kg/m2   SKIN: warm and  Dry without overt rashes, tattoos and scars  HEAD and Neck: without JVD, Head and scalp: normal Eyes:No scleral icterus. Fundi normal, eye movements normal. Left cornea has a white foreign body at the 7 o'clock position. There was an abrasion at the 4 oclock position of the cornea unto the conjunctiva, seen on fluorescene.  Ears: Auricle normal, canal normal, Tympanic membranes normal, insufflation normal. Nose: normal Throat:  normal Neck & thyroid: normal  CHEST & LUNGS: Chest wall: normal Lungs: Clear  CVS: Reveals the PMI to be normally located. Regular rhythm, First and Second Heart sounds are normal,  absence of murmurs, rubs or gallops. Peripheral vasculature: Radial pulses: normal Dorsal pedis pulses: normal Posterior pulses: normal  ABDOMEN:  Appearance: normal Benign, no organomegaly, no masses, no Abdominal Aortic enlargement. No Guarding , no rebound. No Bruits. Bowel sounds: normal  ASSESSMENT: Corneal abrasion, left, initial encounter - Plan: tobramycin (TOBREX) 0.3 % ophthalmic solution, Hydrocodone-Acetaminophen 5-300 MG TABS  Foreign body of left eye   PLAN: No orders of the defined types were placed in this encounter.    Meds ordered this encounter  Medications  . sotalol (BETAPACE) 80 MG tablet    Sig: Take 80 mg by mouth daily.   [name] tobramycin (TOBREX) 0.3 % ophthalmic solution    Sig: Place 1 drop into the left eye every 4 (four) hours.    Dispense:  5 mL    Refill:  0  . Hydrocodone-Acetaminophen 5-300 MG TABS    Sig: Take 1 tablet by mouth every 6 (six) hours as needed.    Dispense:  6 each    Refill:  0   PROCEDURE: Fluorescene applied and an abrasion seen at the 4 o'clock position of the cornea unto the conjunctiva. Also a tiny white foreign body at the 7 o'clock position of the cornea. Anesthetic eye drops applied then a 30G needle used to remove the FB and a gauze to flip it out. eyepatch applied after tobradex drops applied.Patient tolerated the procedure well.  Return in about 1 day (around 09/14/2012) for recheck left eye.Thelma Barge P. [place] Charon, M.D.

## 2012-09-14 ENCOUNTER — Encounter: Payer: Self-pay | Admitting: Family Medicine

## 2012-09-14 ENCOUNTER — Ambulatory Visit (INDEPENDENT_AMBULATORY_CARE_PROVIDER_SITE_OTHER): Payer: Medicare Other | Admitting: Family Medicine

## 2012-09-14 VITALS — BP 105/71 | HR 76 | Temp 97.5°F | Wt 160.4 lb

## 2012-09-14 DIAGNOSIS — S058X9A Other injuries of unspecified eye and orbit, initial encounter: Secondary | ICD-10-CM

## 2012-09-14 DIAGNOSIS — Z5189 Encounter for other specified aftercare: Secondary | ICD-10-CM | POA: Diagnosis not present

## 2012-09-14 DIAGNOSIS — S0500XA Injury of conjunctiva and corneal abrasion without foreign body, unspecified eye, initial encounter: Secondary | ICD-10-CM | POA: Insufficient documentation

## 2012-09-14 DIAGNOSIS — S0502XD Injury of conjunctiva and corneal abrasion without foreign body, left eye, subsequent encounter: Secondary | ICD-10-CM

## 2012-09-14 NOTE — Progress Notes (Signed)
Subjective:     [name]: Christopher Avery, male   DOB: December 29, 1950, 62 y.o.   MRN: [id number] Chief Complaint  Patient presents with  . Follow-up    reck left eye stataes "better"     HPI Feels fine now using the eye drops. No problems  Past Medical History  Diagnosis Date  . Unspecified disorder of kidney and ureter   . Other and unspecified hyperlipidemia   . Unspecified essential hypertension   . Atrial fibrillation    No past surgical history on file. Current Outpatient Prescriptions on File Prior to Visit  Medication Sig Dispense Refill  . atorvastatin (LIPITOR) 10 MG tablet Take 5 mg by mouth daily.        [name] co-enzyme Q-10 30 MG capsule Take 100 mg by mouth daily.       [name] diltiazem (CARDIZEM CD) 180 MG 24 hr capsule Take 180 mg by mouth daily.        . Hydrocodone-Acetaminophen 5-300 MG TABS Take 1 tablet by mouth every 6 (six) hours as needed.  6 each  0  . linagliptin (TRADJENTA) 5 MG TABS tablet Take 5 mg by mouth daily.      . metFORMIN (GLUCOPHAGE) 500 MG tablet Take 500 mg by mouth. 1 am  2 pm      . Multiple Vitamin (MULTIVITAMIN) capsule Take 1 capsule by mouth daily.        . sildenafil (VIAGRA) 100 MG tablet Take 100 mg by mouth daily as needed.        . sotalol (BETAPACE) 80 MG tablet Take 80 mg by mouth daily.       [name] tobramycin (TOBREX) 0.3 % ophthalmic solution Place 1 drop into the left eye every 4 (four) hours.  5 mL  0  . warfarin (COUMADIN) 2.5 MG tablet Take 2.5 mg by mouth daily.         No current facility-administered medications on file prior to visit.   No Known Allergies Immunization History  Administered Date(s) Administered  . Influenza Split 12/05/2010, 12/03/2011   History   Social History  . Marital Status: Married    Spouse Name: [name]    Number of Children: 2  . Years of Education: [name]   Occupational History  . Retired    Social History Main Topics  . Smoking status: Never Smoker   . Smokeless tobacco: Not on file  . Alcohol  Use: Not on file  . Drug Use: Not on file  . Sexually Active: Not on file   Other Topics Concern  . Not on file   Social History Narrative  . No narrative on file       Review of Systems Nil else    Objective:   Physical Exam  Constitutional: He appears well-developed and well-nourished.  HENT:  Head: Normocephalic.  Eyes: Conjunctivae, EOM and lids are normal. Pupils are equal, round, and reactive to light. No foreign bodies found. Right eye exhibits no chemosis, no discharge, no exudate and no hordeolum. No foreign body present in the right eye. Left eye exhibits no chemosis, no discharge, no exudate and no hordeolum. No foreign body present in the left eye.     The corneal abrasion is smaller and pinhead size.the abrasion on the conjunctiva is 50% smaller.    Assessment:     Corneal abrasion, left, subsequent encounter - resolving  Conjunctival abrasion, left, subsequent encounter - resolving      Plan:  continue antibiotic drops for the  rest of the week.    Return if symptoms worsen or fail to improve.  Milla Wahlberg P. [place] Charon, M.D.

## 2012-09-22 ENCOUNTER — Ambulatory Visit (INDEPENDENT_AMBULATORY_CARE_PROVIDER_SITE_OTHER): Payer: Medicare Other | Admitting: Pharmacist Clinician (PhC)/ Clinical Pharmacy Specialist

## 2012-09-22 VITALS — BP 98/66 | HR 72

## 2012-09-22 DIAGNOSIS — I4891 Unspecified atrial fibrillation: Secondary | ICD-10-CM

## 2012-09-22 DIAGNOSIS — Z7901 Long term (current) use of anticoagulants: Secondary | ICD-10-CM

## 2012-10-05 ENCOUNTER — Other Ambulatory Visit: Payer: Self-pay | Admitting: *Deleted

## 2012-10-05 MED ORDER — SOTALOL HCL 80 MG PO TABS: ORAL_TABLET | ORAL | Status: DC

## 2012-10-05 NOTE — Telephone Encounter (Signed)
Rx was sent to pharmacy electronically. 

## 2012-10-27 ENCOUNTER — Ambulatory Visit: Payer: Self-pay | Admitting: Pharmacist Clinician (PhC)/ Clinical Pharmacy Specialist

## 2012-10-28 ENCOUNTER — Ambulatory Visit (INDEPENDENT_AMBULATORY_CARE_PROVIDER_SITE_OTHER): Payer: Medicare Other | Admitting: Pharmacist Clinician (PhC)/ Clinical Pharmacy Specialist

## 2012-10-28 VITALS — BP 110/62 | HR 64

## 2012-10-28 DIAGNOSIS — Z7901 Long term (current) use of anticoagulants: Secondary | ICD-10-CM | POA: Diagnosis not present

## 2012-10-28 DIAGNOSIS — I4891 Unspecified atrial fibrillation: Secondary | ICD-10-CM

## 2012-11-08 DIAGNOSIS — D485 Neoplasm of uncertain behavior of skin: Secondary | ICD-10-CM | POA: Diagnosis not present

## 2012-11-08 DIAGNOSIS — Z85820 Personal history of malignant melanoma of skin: Secondary | ICD-10-CM | POA: Diagnosis not present

## 2012-11-08 DIAGNOSIS — L570 Actinic keratosis: Secondary | ICD-10-CM | POA: Diagnosis not present

## 2012-11-26 ENCOUNTER — Ambulatory Visit (INDEPENDENT_AMBULATORY_CARE_PROVIDER_SITE_OTHER): Payer: Medicare Other | Admitting: Pharmacist Clinician (PhC)/ Clinical Pharmacy Specialist

## 2012-11-26 VITALS — BP 110/70 | HR 64

## 2012-11-26 DIAGNOSIS — Z7901 Long term (current) use of anticoagulants: Secondary | ICD-10-CM

## 2012-11-26 DIAGNOSIS — I4891 Unspecified atrial fibrillation: Secondary | ICD-10-CM | POA: Diagnosis not present

## 2012-12-21 DIAGNOSIS — B009 Herpesviral infection, unspecified: Secondary | ICD-10-CM | POA: Diagnosis not present

## 2012-12-21 DIAGNOSIS — Z23 Encounter for immunization: Secondary | ICD-10-CM | POA: Diagnosis not present

## 2012-12-21 DIAGNOSIS — Z125 Encounter for screening for malignant neoplasm of prostate: Secondary | ICD-10-CM | POA: Diagnosis not present

## 2012-12-21 DIAGNOSIS — I4891 Unspecified atrial fibrillation: Secondary | ICD-10-CM | POA: Diagnosis not present

## 2012-12-21 DIAGNOSIS — E7800 Pure hypercholesterolemia, unspecified: Secondary | ICD-10-CM | POA: Diagnosis not present

## 2012-12-21 DIAGNOSIS — I10 Essential (primary) hypertension: Secondary | ICD-10-CM | POA: Diagnosis not present

## 2012-12-21 DIAGNOSIS — Z0000 Encounter for general adult medical examination without abnormal findings: Secondary | ICD-10-CM | POA: Diagnosis not present

## 2012-12-21 DIAGNOSIS — Z1331 Encounter for screening for depression: Secondary | ICD-10-CM | POA: Diagnosis not present

## 2012-12-21 DIAGNOSIS — N529 Male erectile dysfunction, unspecified: Secondary | ICD-10-CM | POA: Diagnosis not present

## 2012-12-21 DIAGNOSIS — E119 Type 2 diabetes mellitus without complications: Secondary | ICD-10-CM | POA: Diagnosis not present

## 2012-12-28 ENCOUNTER — Ambulatory Visit: Payer: Self-pay | Admitting: Pharmacist Clinician (PhC)/ Clinical Pharmacy Specialist

## 2012-12-29 ENCOUNTER — Ambulatory Visit (INDEPENDENT_AMBULATORY_CARE_PROVIDER_SITE_OTHER): Payer: Medicare Other | Admitting: Pharmacist Clinician (PhC)/ Clinical Pharmacy Specialist

## 2012-12-29 VITALS — BP 116/80 | HR 60

## 2012-12-29 DIAGNOSIS — I4891 Unspecified atrial fibrillation: Secondary | ICD-10-CM | POA: Diagnosis not present

## 2012-12-29 DIAGNOSIS — Z7901 Long term (current) use of anticoagulants: Secondary | ICD-10-CM

## 2013-01-07 ENCOUNTER — Ambulatory Visit: Payer: Self-pay | Admitting: Cardiovascular Disease

## 2013-01-07 ENCOUNTER — Other Ambulatory Visit: Payer: Self-pay | Admitting: Cardiovascular Disease

## 2013-01-11 NOTE — Telephone Encounter (Signed)
Rx was sent to pharmacy electronically. 

## 2013-01-12 ENCOUNTER — Encounter: Payer: Self-pay | Admitting: Cardiovascular Disease

## 2013-01-12 ENCOUNTER — Ambulatory Visit (INDEPENDENT_AMBULATORY_CARE_PROVIDER_SITE_OTHER): Payer: Medicare Other | Admitting: Cardiovascular Disease

## 2013-01-12 VITALS — BP 110/60 | HR 67 | Ht 72.0 in | Wt 157.0 lb

## 2013-01-12 DIAGNOSIS — I4891 Unspecified atrial fibrillation: Secondary | ICD-10-CM

## 2013-01-12 DIAGNOSIS — E785 Hyperlipidemia, unspecified: Secondary | ICD-10-CM

## 2013-01-12 DIAGNOSIS — I10 Essential (primary) hypertension: Secondary | ICD-10-CM

## 2013-01-12 NOTE — Assessment & Plan Note (Signed)
Well-controlled on current medications 

## 2013-01-12 NOTE — Assessment & Plan Note (Signed)
On statin therapy followed by his PCP 

## 2013-01-12 NOTE — Assessment & Plan Note (Signed)
Maintaining sinus rhythm on sotalol and Coumadin anticoagulation which we follow

## 2013-01-12 NOTE — Patient Instructions (Signed)
Your physician wants you to follow-up in: 1 year with Dr Berry. You will receive a reminder letter in the mail two months in advance. If you don't receive a letter, please call our office to schedule the follow-up appointment.  

## 2013-01-12 NOTE — Progress Notes (Signed)
01/12/2013 [name]   January 16, 1951  [id number]  Primary Physician Rudi Heap, MD Primary Cardiologist: Runell Gess MD Roseanne Reno   HPI:  The patient is a delightful 62 year old thin and fit-appearing married Caucasian male, father of 2, grandfather of 4 grandchildren, who I saw a year ago. He has a history of paroxysmal atrial fibrillation, maintaining sinus rhythm on Coumadin anticoagulation and sotalol. His other problems include hypertension, hyperlipidemia, and family history of heart disease. He is totally asymptomatic. His last Myoview performed in our office, November 03, 2006, was nonischemic. Since I saw him he go he has been asymptomatic. Dr. Kevan Ny calls his lipid profile.     Current Outpatient Prescriptions  Medication Sig Dispense Refill  . atorvastatin (LIPITOR) 10 MG tablet Take 5 mg by mouth daily.        [name] CINNAMON PO Take by mouth at bedtime.      [name] co-enzyme Q-10 30 MG capsule Take 100 mg by mouth daily.       [name] diltiazem (CARDIZEM CD) 180 MG 24 hr capsule Take 180 mg by mouth daily.        . Hydrocodone-Acetaminophen 5-300 MG TABS Take 1 tablet by mouth every 6 (six) hours as needed.  6 each  0  . linagliptin (TRADJENTA) 5 MG TABS tablet Take 5 mg by mouth daily.      . metFORMIN (GLUCOPHAGE) 500 MG tablet Take 500 mg by mouth 2 (two) times daily with a meal.       . sildenafil (VIAGRA) 100 MG tablet Take 100 mg by mouth daily as needed.        . sotalol (BETAPACE) 80 MG tablet TAKE 1/2 TABLET (40 MG) BY MOUTH TWICE DAILY.  30 tablet  0  . warfarin (COUMADIN) 2.5 MG tablet Take 2.5 mg by mouth daily.         No current facility-administered medications for this visit.    No Known Allergies  History   Social History  . Marital Status: Married    Spouse Name: [name]    Number of Children: 2  . Years of Education: [name]   Occupational History  . Retired    Social History Main Topics  . Smoking status: Never Smoker   . Smokeless  tobacco: Not on file  . Alcohol Use: Not on file  . Drug Use: Not on file  . Sexual Activity: Not on file   Other Topics Concern  . Not on file   Social History Narrative  . No narrative on file     Review of Systems: General: negative for chills, fever, night sweats or weight changes.  Cardiovascular: negative for chest pain, dyspnea on exertion, edema, orthopnea, palpitations, paroxysmal nocturnal dyspnea or shortness of breath Dermatological: negative for rash Respiratory: negative for cough or wheezing Urologic: negative for hematuria Abdominal: negative for nausea, vomiting, diarrhea, bright red blood per rectum, melena, or hematemesis Neurologic: negative for visual changes, syncope, or dizziness All other systems reviewed and are otherwise negative except as noted above.    Blood pressure 110/60, pulse 67, height 6' (1.829 m), weight 157 lb (71.215 kg).  General appearance: alert and no distress Neck: no adenopathy, no carotid bruit, no JVD, supple, symmetrical, trachea midline and thyroid not enlarged, symmetric, no tenderness/mass/nodules Lungs: clear to auscultation bilaterally Heart: regular rate and rhythm, S1, S2 normal, no murmur, click, rub or gallop Extremities: extremities normal, atraumatic, no cyanosis or edema  EKG normal sinus rhythm at  67 without ST or T wave changes  ASSESSMENT AND PLAN:   FIBRILLATION, ATRIAL Maintaining sinus rhythm on sotalol and Coumadin anticoagulation which we follow  HYPERLIPIDEMIA On statin therapy followed by his PCP  HYPERTENSION Well-controlled on current medications      Runell Gess MD [facility], [facility] 01/12/2013 10:37 AM

## 2013-01-13 ENCOUNTER — Encounter: Payer: Self-pay | Admitting: Cardiovascular Disease

## 2013-01-25 ENCOUNTER — Other Ambulatory Visit: Payer: Self-pay | Admitting: Cardiovascular Disease

## 2013-01-25 ENCOUNTER — Encounter: Payer: Self-pay | Admitting: Pharmacist Clinician (PhC)/ Clinical Pharmacy Specialist

## 2013-01-25 ENCOUNTER — Ambulatory Visit (INDEPENDENT_AMBULATORY_CARE_PROVIDER_SITE_OTHER): Payer: Medicare Other | Admitting: Pharmacist Clinician (PhC)/ Clinical Pharmacy Specialist

## 2013-01-25 DIAGNOSIS — E119 Type 2 diabetes mellitus without complications: Secondary | ICD-10-CM | POA: Diagnosis not present

## 2013-01-25 MED ORDER — METFORMIN HCL 500 MG PO TABS: 1000.0000 mg | ORAL_TABLET | Freq: Two times a day (BID) | ORAL | Status: DC

## 2013-01-25 NOTE — Progress Notes (Signed)
   Subjective:    [name]: Christopher Avery, male    DOB: 28-Sep-1950, 62 y.o.   MRN: [id number]  HPI:  Type 2 DM    Review of Systems  Constitutional: Negative.   HENT: Negative.   Eyes: Negative.   Respiratory: Negative.   Cardiovascular: Negative.   Endocrine: Negative.   Musculoskeletal: Negative.   Skin: Negative.   Neurological: Negative.   Psychiatric/Behavioral: Negative.        Objective:   Physical Exam  Constitutional: He is oriented to person, place, and time. He appears well-developed and well-nourished.  Neurological: He is alert and oriented to person, place, and time.  Skin: Skin is warm and dry.  Psychiatric: He has a normal mood and affect. His behavior is normal. Judgment and thought content normal.          Assessment & Plan:   Diabetes Follow-Up Visit Chief Complaint:  No chief complaint on file.    Exam Regularity:  RRR Edema:  neg  Polyuria:  neg  Polydipsia:  neg Polyphagia:  neg  BMI:  There is no weight on file to calculate BMI.   Weight changes:  40 lb loss over 2 years General Appearance:  alert, oriented, no acute distress Mood/Affect:  normal  HPI:  Type 2 DM  Low fat/carbohydrate diet?  Yes Nicotine Abuse?  No Medication Compliance?  Yes Exercise?  Yes Alcohol Abuse?  No  Home BG Monitoring:  Checking 2 times a day. Average:  140  High: 178  Low:  92   No results found for this basename: HGBA1C    No results found for this basename: MICROALBUR, MALB24HUR    No results found for this basename: CHOL, HDL, LDLCALC, LDLDIRECT, TRIG, CHOLHDL      Assessment: 1.  Diabetes.  Well controlled Type 2 DM with A1C of 7.1% 2.  Blood Pressure.  At goal, needs low dose ACEI added next visit 3.  Lipids.  Well controlled at goal on lipitor 5mg  4.  Foot Care.  reviewed 5.  Dental Care.  Twice a day 6.  Eye Care/Exam.   annual  Recommendations: 1.  Patient is counseled on appropriate foot care. 2.  BP goal < 130/80. 3.  LDL  goal of < 100, HDL > 40 and TG < 150. 4.  Eye Exam yearly and Dental Exam every 6 months. 5.  Dietary recommendations:  1700 cal ADA diet.  Taught patient CHO meal planning today 6.  Physical Activity recommendations:  30 minutes or more a day 7.  Medication recommendations at this time are as follows:  Increase metformin to 1gm bid.  His cardiololgist wants patient to aim for an A1C of 5.5%, I told patient this may be too tight control. 8.  Return to clinic in 4-6 wks   Time spent counseling patient:  45 min  Physician time spent with patient:    Referring provider:  Modesto Charon   PharmD:  Chari Manning, [facility]

## 2013-01-25 NOTE — Telephone Encounter (Signed)
Rx was sent to pharmacy electronically. 

## 2013-02-02 ENCOUNTER — Telehealth: Payer: Self-pay | Admitting: Pharmacist

## 2013-02-10 ENCOUNTER — Other Ambulatory Visit: Payer: Self-pay | Admitting: Cardiovascular Disease

## 2013-02-10 DIAGNOSIS — Z85820 Personal history of malignant melanoma of skin: Secondary | ICD-10-CM | POA: Diagnosis not present

## 2013-02-10 DIAGNOSIS — L570 Actinic keratosis: Secondary | ICD-10-CM | POA: Diagnosis not present

## 2013-02-10 DIAGNOSIS — D485 Neoplasm of uncertain behavior of skin: Secondary | ICD-10-CM | POA: Diagnosis not present

## 2013-02-10 DIAGNOSIS — D235 Other benign neoplasm of skin of trunk: Secondary | ICD-10-CM | POA: Diagnosis not present

## 2013-02-10 NOTE — Telephone Encounter (Signed)
Rx was sent to pharmacy electronically. 

## 2013-02-11 ENCOUNTER — Ambulatory Visit (INDEPENDENT_AMBULATORY_CARE_PROVIDER_SITE_OTHER): Payer: Medicare Other | Admitting: Family Medicine

## 2013-02-11 ENCOUNTER — Telehealth: Payer: Self-pay | Admitting: Family Medicine

## 2013-02-11 ENCOUNTER — Encounter: Payer: Self-pay | Admitting: Family Medicine

## 2013-02-11 VITALS — BP 114/68 | HR 75 | Temp 98.7°F | Ht 72.0 in | Wt 158.0 lb

## 2013-02-11 DIAGNOSIS — J069 Acute upper respiratory infection, unspecified: Secondary | ICD-10-CM

## 2013-02-11 MED ORDER — OSELTAMIVIR PHOSPHATE 75 MG PO CAPS: 75.0000 mg | ORAL_CAPSULE | Freq: Two times a day (BID) | ORAL | Status: DC

## 2013-02-11 NOTE — Telephone Encounter (Signed)
They want rx for tamiflu but he states he verbalized he has stuffy nose and chest congestion He was advised it would be in best intersest to be checked for the congestion before starting tamiflu appt today with Bill pt verbalizes understanding

## 2013-02-11 NOTE — Patient Instructions (Signed)

## 2013-02-11 NOTE — Progress Notes (Signed)
   Subjective:    [name]: Christopher Avery, male    DOB: [birth year], 64 y.o.   MRN: [id number]  HPI  This 63 y.o. male presents for evaluation of uri sx's for over 2 days.  His grandchild has The flu and he is taking care of his grandchild.   Review of Systems No chest pain, SOB, HA, dizziness, vision change, N/V, diarrhea, constipation, dysuria, urinary urgency or frequency, myalgias, arthralgias or rash.     Objective:   Physical Exam Vital signs noted  Well developed well nourished male.  HEENT - Head atraumatic Normocephalic                Eyes - PERRLA, Conjuctiva - clear Sclera- Clear EOMI                Ears - EAC's Wnl TM's Wnl Gross Hearing WNL                Nose - Nares patent                 Throat - oropharanx wnl Respiratory - Lungs CTA bilateral Cardiac - RRR S1 and S2 without murmur GI - Abdomen soft Nontender and bowel sounds active x 4 Extremities - No edema. Neuro - Grossly intact.        Assessment & Plan:  URI (upper respiratory infection) Push po fluids, rest, tylenol and motrin otc prn as directed for fever, arthralgias, and myalgias.  Follow up prn if sx's continue or persist. Tamiflu 75 po bid x 5 days   Lysbeth Penner FNP

## 2013-02-11 NOTE — Telephone Encounter (Signed)
Spoke with Christopher Avery states his grandson is sick -dont know if flu or not . Child's mother taking him to his doctor today and he will call and let us know if flu but would like to have tamiflu.  Advised pt to find out if grandson has flu and call us back for further recommendations.

## 2013-02-25 ENCOUNTER — Ambulatory Visit: Payer: Self-pay | Admitting: Pharmacist Clinician (PhC)/ Clinical Pharmacy Specialist

## 2013-02-25 ENCOUNTER — Ambulatory Visit: Payer: Medicare Other | Admitting: Pharmacist Clinician (PhC)/ Clinical Pharmacy Specialist

## 2013-02-25 DIAGNOSIS — I4891 Unspecified atrial fibrillation: Secondary | ICD-10-CM

## 2013-02-25 DIAGNOSIS — Z7901 Long term (current) use of anticoagulants: Secondary | ICD-10-CM

## 2013-03-01 ENCOUNTER — Ambulatory Visit (INDEPENDENT_AMBULATORY_CARE_PROVIDER_SITE_OTHER): Payer: Medicare Other | Admitting: Pharmacist Clinician (PhC)/ Clinical Pharmacy Specialist

## 2013-03-01 ENCOUNTER — Encounter (INDEPENDENT_AMBULATORY_CARE_PROVIDER_SITE_OTHER): Payer: Self-pay

## 2013-03-01 DIAGNOSIS — Z7901 Long term (current) use of anticoagulants: Secondary | ICD-10-CM

## 2013-03-01 DIAGNOSIS — I4891 Unspecified atrial fibrillation: Secondary | ICD-10-CM

## 2013-03-01 LAB — POCT INR: INR: 2.5

## 2013-03-04 ENCOUNTER — Other Ambulatory Visit: Payer: Self-pay

## 2013-03-04 MED ORDER — SOTALOL HCL 80 MG PO TABS: 40.0000 mg | ORAL_TABLET | Freq: Every day | ORAL | Status: DC

## 2013-03-04 NOTE — Telephone Encounter (Signed)
Rx was sent to pharmacy electronically. 

## 2013-03-07 ENCOUNTER — Telehealth: Payer: Self-pay | Admitting: Cardiovascular Disease

## 2013-03-07 MED ORDER — SOTALOL HCL 80 MG PO TABS: 40.0000 mg | ORAL_TABLET | Freq: Two times a day (BID) | ORAL | Status: DC

## 2013-03-07 NOTE — Telephone Encounter (Signed)
Received a letter from his insurance said that al his prescriptions will have to #90 from now on.

## 2013-03-07 NOTE — Telephone Encounter (Signed)
Rx was sent to pharmacy electronically. 

## 2013-04-12 ENCOUNTER — Ambulatory Visit (INDEPENDENT_AMBULATORY_CARE_PROVIDER_SITE_OTHER): Payer: Medicare Other | Admitting: Pharmacist Clinician (PhC)/ Clinical Pharmacy Specialist

## 2013-04-12 DIAGNOSIS — E119 Type 2 diabetes mellitus without complications: Secondary | ICD-10-CM | POA: Diagnosis not present

## 2013-04-12 LAB — POCT GLYCOSYLATED HEMOGLOBIN (HGB A1C): HEMOGLOBIN A1C: 7.7

## 2013-04-12 LAB — POCT INR: INR: 1.9

## 2013-04-12 MED ORDER — GLIMEPIRIDE 4 MG PO TABS: 4.0000 mg | ORAL_TABLET | Freq: Every day | ORAL | Status: DC

## 2013-04-12 NOTE — Progress Notes (Signed)
Patient comes in today for anticoagulation and also for me to review his blood glucose log and food diary.  His am BG readings are still running high averaging 160's.  PM readings are around 100.  I am checking his A1C today.  Diet reviewed with patient and identified days he is eating too many CHO.  [place] is expensive for patient and he also wants to gain 10-15 lbs so I am changing his Tradjenta to Glimepiride 4mg  1 qam.  He will continue taking metformin.

## 2013-04-21 ENCOUNTER — Telehealth: Payer: Self-pay | Admitting: *Deleted

## 2013-04-21 MED ORDER — WARFARIN SODIUM 2.5 MG PO TABS: 2.5000 mg | ORAL_TABLET | Freq: Every day | ORAL | Status: DC

## 2013-04-21 NOTE — Telephone Encounter (Signed)
No refill request on file.  Refill(s) sent to pharmacy.  Returned call and pt verified x 2.  Pt informed message received and request was not received, but refill just sent.  Pt verbalized understanding and agreed w/ plan.

## 2013-04-21 NOTE — Telephone Encounter (Signed)
Pt stated that he is out of his Warfarin and he stated that he has called twice and no one has gotten back to the pharmacy about this. [facility] in [place].   JB

## 2013-04-26 ENCOUNTER — Other Ambulatory Visit: Payer: Self-pay | Admitting: Pharmacist Clinician (PhC)/ Clinical Pharmacy Specialist

## 2013-04-26 MED ORDER — WARFARIN SODIUM 2.5 MG PO TABS: ORAL_TABLET | ORAL | Status: DC

## 2013-05-24 ENCOUNTER — Ambulatory Visit (INDEPENDENT_AMBULATORY_CARE_PROVIDER_SITE_OTHER): Payer: Medicare Other | Admitting: Pharmacist Clinician (PhC)/ Clinical Pharmacy Specialist

## 2013-05-24 DIAGNOSIS — I4891 Unspecified atrial fibrillation: Secondary | ICD-10-CM

## 2013-05-24 LAB — POCT INR: INR: 1.7

## 2013-05-26 ENCOUNTER — Encounter: Payer: Self-pay | Admitting: *Deleted

## 2013-05-30 DIAGNOSIS — Z85820 Personal history of malignant melanoma of skin: Secondary | ICD-10-CM | POA: Diagnosis not present

## 2013-05-30 DIAGNOSIS — D235 Other benign neoplasm of skin of trunk: Secondary | ICD-10-CM | POA: Diagnosis not present

## 2013-05-31 ENCOUNTER — Telehealth: Payer: Self-pay | Admitting: Family Medicine

## 2013-06-01 NOTE — Telephone Encounter (Signed)
Called Patient regarding laser oral surgery with Dr. Owens Shark next week.  Dr. Owens Shark said it was not necessary to stop the coumadin for the procedure, so patient will continue taking it.  I instructed him to take 1/2 glimepiride dose at lunch time since he is having the procedure in the morning and won't eat until lunch that day.  He will call me with any further questions.

## 2013-06-09 DIAGNOSIS — K1370 Unspecified lesions of oral mucosa: Secondary | ICD-10-CM | POA: Diagnosis not present

## 2013-06-09 DIAGNOSIS — K1120 Sialoadenitis, unspecified: Secondary | ICD-10-CM | POA: Diagnosis not present

## 2013-06-14 ENCOUNTER — Telehealth: Payer: Self-pay | Admitting: Family Medicine

## 2013-06-15 NOTE — Telephone Encounter (Signed)
abx's and illness will cause elevation in blood sugar and should be transient so if blood sugars do not come down follow up

## 2013-06-15 NOTE — Telephone Encounter (Signed)
Patient aware.

## 2013-06-17 DIAGNOSIS — K1370 Unspecified lesions of oral mucosa: Secondary | ICD-10-CM | POA: Diagnosis not present

## 2013-06-17 DIAGNOSIS — K1120 Sialoadenitis, unspecified: Secondary | ICD-10-CM | POA: Diagnosis not present

## 2013-06-28 ENCOUNTER — Other Ambulatory Visit: Payer: Self-pay

## 2013-06-28 DIAGNOSIS — E119 Type 2 diabetes mellitus without complications: Secondary | ICD-10-CM

## 2013-06-28 MED ORDER — GLIMEPIRIDE 4 MG PO TABS: 4.0000 mg | ORAL_TABLET | Freq: Every day | ORAL | Status: DC

## 2013-06-28 NOTE — Telephone Encounter (Signed)
Last seen 02/11/13 B [place]  Last glucose 04/12/13  Requesting 90 day supply

## 2013-07-05 ENCOUNTER — Ambulatory Visit: Payer: Self-pay

## 2013-07-05 ENCOUNTER — Ambulatory Visit (INDEPENDENT_AMBULATORY_CARE_PROVIDER_SITE_OTHER): Payer: Medicare Other | Admitting: Pharmacist Clinician (PhC)/ Clinical Pharmacy Specialist

## 2013-07-05 DIAGNOSIS — I4891 Unspecified atrial fibrillation: Secondary | ICD-10-CM | POA: Diagnosis not present

## 2013-07-05 LAB — POCT INR: INR: 2

## 2013-07-10 ENCOUNTER — Other Ambulatory Visit: Payer: Self-pay | Admitting: Pharmacist Clinician (PhC)/ Clinical Pharmacy Specialist

## 2013-07-10 MED ORDER — WARFARIN SODIUM 2.5 MG PO TABS: ORAL_TABLET | ORAL | Status: DC

## 2013-07-13 ENCOUNTER — Encounter: Payer: Self-pay | Admitting: Pulmonary Disease

## 2013-07-13 ENCOUNTER — Telehealth: Payer: Self-pay | Admitting: Pulmonary Disease

## 2013-07-13 ENCOUNTER — Ambulatory Visit (INDEPENDENT_AMBULATORY_CARE_PROVIDER_SITE_OTHER)
Admission: RE | Admit: 2013-07-13 | Discharge: 2013-07-13 | Disposition: A | Discharge status: Home / Self Care | Payer: Worker's Compensation | Source: Ambulatory Visit | Attending: Pulmonary Disease | Admitting: Pulmonary Disease

## 2013-07-13 ENCOUNTER — Ambulatory Visit (INDEPENDENT_AMBULATORY_CARE_PROVIDER_SITE_OTHER): Payer: Worker's Compensation | Admitting: Pulmonary Disease

## 2013-07-13 VITALS — BP 100/62 | HR 76 | Temp 98.0°F | Ht 73.0 in | Wt 150.8 lb

## 2013-07-13 DIAGNOSIS — J61 Pneumoconiosis due to asbestos and other mineral fibers: Secondary | ICD-10-CM

## 2013-07-13 NOTE — Assessment & Plan Note (Signed)
The patient appears to be doing well from a pulmonary standpoint, and will check his chest x-ray again today as part of his screening for mesothelioma. He has not had PFTs in a few years, and we'll therefore repeat these at his next visit in one year. In the meantime, he is to call if he has any worsening symptoms.

## 2013-07-13 NOTE — Telephone Encounter (Signed)
    Result Note     Please let pt know that his cxr looks good  ---  I spoke with patient about results and he verbalized understanding and had no questions.

## 2013-07-13 NOTE — Progress Notes (Signed)
   Subjective:    [name]: Christopher Avery, male    DOB: Feb 12, 1950, 63 y.o.   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  He is done extremely well since the last visit from a pulmonary standpoint, with no cough, congestion, or exertional dyspnea. He denies any pleuritic chest pain, and has been maintaining his weight   Review of Systems  Constitutional: Negative for fever and unexpected weight change.  HENT: Negative for congestion, dental problem, ear pain, nosebleeds, postnasal drip, rhinorrhea, sinus pressure, sneezing, sore throat and trouble swallowing.   Eyes: Negative for redness and itching.  Respiratory: Negative for cough, chest tightness, shortness of breath and wheezing.   Cardiovascular: Negative for palpitations and leg swelling.  Gastrointestinal: Negative for nausea and vomiting.  Genitourinary: Negative for dysuria.  Musculoskeletal: Negative for joint swelling.  Skin: Negative for rash.  Neurological: Negative for headaches.  Hematological: Does not bruise/bleed easily.  Psychiatric/Behavioral: Negative for dysphoric mood. The patient is not nervous/anxious.        Objective:   Physical Exam Thin male in no acute distress Nose without purulence or discharge noted Neck without lymphadenopathy or thyromegaly Chest totally clear to auscultation, no wheezing Cardiac exam with regular rate and rhythm Lower extremities without edema, varicosities noted Alert and oriented, moves all 4 extremities.       Assessment & Plan:

## 2013-07-13 NOTE — Patient Instructions (Signed)
Continue to stay active, and call if increased symptoms. Will check cxr this am, and call you with results. Will see you back in one year, and will schedule breathing tests for that visit.

## 2013-07-15 ENCOUNTER — Other Ambulatory Visit (INDEPENDENT_AMBULATORY_CARE_PROVIDER_SITE_OTHER): Payer: Medicare Other

## 2013-07-15 DIAGNOSIS — I10 Essential (primary) hypertension: Secondary | ICD-10-CM | POA: Diagnosis not present

## 2013-07-15 DIAGNOSIS — Z0000 Encounter for general adult medical examination without abnormal findings: Secondary | ICD-10-CM | POA: Diagnosis not present

## 2013-07-15 DIAGNOSIS — E119 Type 2 diabetes mellitus without complications: Secondary | ICD-10-CM

## 2013-07-15 DIAGNOSIS — E785 Hyperlipidemia, unspecified: Secondary | ICD-10-CM | POA: Diagnosis not present

## 2013-07-15 DIAGNOSIS — E559 Vitamin D deficiency, unspecified: Secondary | ICD-10-CM

## 2013-07-15 LAB — POCT CBC
Granulocyte percent: 66.4 %G (ref 37–80)
HEMATOCRIT: 40 % — AB (ref 43.5–53.7)
HEMOGLOBIN: 13.5 g/dL — AB (ref 14.1–18.1)
Lymph, poc: 1.7 (ref 0.6–3.4)
MCH, POC: 30.1 pg (ref 27–31.2)
MCHC: 33.8 g/dL (ref 31.8–35.4)
MCV: 88.9 fL (ref 80–97)
MPV: 8 fL (ref 0–99.8)
POC GRANULOCYTE: 3.7 (ref 2–6.9)
POC LYMPH %: 30.3 % (ref 10–50)
Platelet Count, POC: 198 10*3/uL (ref 142–424)
RBC: 4.5 M/uL — AB (ref 4.69–6.13)
RDW, POC: 13 %
WBC: 5.5 10*3/uL (ref 4.6–10.2)

## 2013-07-15 LAB — POCT GLYCOSYLATED HEMOGLOBIN (HGB A1C): HEMOGLOBIN A1C: 8

## 2013-07-15 NOTE — Progress Notes (Signed)
PAtient came in for labs only 

## 2013-07-16 LAB — NMR, LIPOPROFILE
CHOLESTEROL: 140 mg/dL (ref 100–199)
HDL CHOLESTEROL BY NMR: 45 mg/dL (ref >39)
HDL PARTICLE NUMBER: 29 umol/L — AB (ref >=30.5)
LDL Particle Number: 987 nmol/L (ref <1000)
LDL Size: 20.7 nm (ref >20.5)
LDLC SERPL CALC-MCNC: 84 mg/dL (ref 0–99)
LP-IR Score: 40 (ref <=45)
SMALL LDL PARTICLE NUMBER: 292 nmol/L (ref <=527)
Triglycerides by NMR: 53 mg/dL (ref 0–149)

## 2013-07-16 LAB — BMP8+EGFR
BUN / CREAT RATIO: 25 — AB (ref 10–22)
BUN: 21 mg/dL (ref 8–27)
CHLORIDE: 103 mmol/L (ref 97–108)
CO2: 25 mmol/L (ref 18–29)
CREATININE: 0.84 mg/dL (ref 0.76–1.27)
Calcium: 9.6 mg/dL (ref 8.6–10.2)
GFR calc non Af Amer: 94 mL/min/{1.73_m2} (ref >59)
GFR, EST AFRICAN AMERICAN: 108 mL/min/{1.73_m2} (ref >59)
GLUCOSE: 159 mg/dL — AB (ref 65–99)
Potassium: 4.1 mmol/L (ref 3.5–5.2)
Sodium: 142 mmol/L (ref 134–144)

## 2013-07-16 LAB — HEPATIC FUNCTION PANEL
ALBUMIN: 4.5 g/dL (ref 3.6–4.8)
ALK PHOS: 49 IU/L (ref 39–117)
ALT: 15 IU/L (ref 0–44)
AST: 19 IU/L (ref 0–40)
BILIRUBIN TOTAL: 0.7 mg/dL (ref 0.0–1.2)
Bilirubin, Direct: 0.17 mg/dL (ref 0.00–0.40)
TOTAL PROTEIN: 6.2 g/dL (ref 6.0–8.5)

## 2013-07-16 LAB — VITAMIN D 25 HYDROXY (VIT D DEFICIENCY, FRACTURES): Vit D, 25-Hydroxy: 47.7 ng/mL (ref 30.0–100.0)

## 2013-07-20 ENCOUNTER — Ambulatory Visit: Payer: Medicare Other | Admitting: Family Medicine

## 2013-07-21 ENCOUNTER — Other Ambulatory Visit: Payer: Medicare Other

## 2013-07-21 DIAGNOSIS — Z1212 Encounter for screening for malignant neoplasm of rectum: Secondary | ICD-10-CM | POA: Diagnosis not present

## 2013-07-21 NOTE — Progress Notes (Signed)
Pt came in for albs only

## 2013-07-22 ENCOUNTER — Telehealth: Payer: Self-pay | Admitting: Pharmacist Clinician (PhC)/ Clinical Pharmacy Specialist

## 2013-07-22 ENCOUNTER — Ambulatory Visit (INDEPENDENT_AMBULATORY_CARE_PROVIDER_SITE_OTHER): Payer: Medicare Other | Admitting: Family Medicine

## 2013-07-22 ENCOUNTER — Encounter: Payer: Self-pay | Admitting: Family Medicine

## 2013-07-22 VITALS — BP 103/69 | HR 67 | Temp 98.4°F | Ht 73.0 in | Wt 146.0 lb

## 2013-07-22 DIAGNOSIS — E119 Type 2 diabetes mellitus without complications: Secondary | ICD-10-CM

## 2013-07-22 DIAGNOSIS — I10 Essential (primary) hypertension: Secondary | ICD-10-CM | POA: Diagnosis not present

## 2013-07-22 DIAGNOSIS — E785 Hyperlipidemia, unspecified: Secondary | ICD-10-CM

## 2013-07-22 DIAGNOSIS — N289 Disorder of kidney and ureter, unspecified: Secondary | ICD-10-CM | POA: Diagnosis not present

## 2013-07-22 LAB — POCT UA - MICROALBUMIN: MICROALBUMIN (UR) POC: NEGATIVE mg/L

## 2013-07-22 LAB — FECAL OCCULT BLOOD, IMMUNOCHEMICAL: FECAL OCCULT BLD: NEGATIVE

## 2013-07-22 NOTE — Progress Notes (Signed)
Subjective:    [name]: Christopher Avery, male    DOB: 1950-04-12, 63 y.o.   MRN: [id number]  HPI Pt here for follow up and management of chronic medical problems. The patient has had some recent weight loss. He is already seeing the clinical pharmacist. He has an appointment with her again soon. His recent lab work was reviewed with him today. His hemoglobin A1c indicates poor blood sugar control and this is the most likely reason for his weight loss. His blood sugar was discussed with him he was given sheets for recording his blood sugar readings and his blood pressures and he will bring the sheets back with him to the visit with the clinical pharmacist. The patient denies any other symptoms no chest pain shortness of breath or GI problems . He indicates that he is up-to-date on his colonoscopies.        Patient Active Problem List   Diagnosis Date Noted  . Conjunctival abrasion 09/14/2012  . Foreign body of left eye 09/13/2012  . Corneal abrasion 09/13/2012  . Type 2 diabetes mellitus 07/29/2012  . Long term (current) use of anticoagulants 04/22/2012  . HYPERLIPIDEMIA 06/23/2008  . HYPERTENSION 06/23/2008  . FIBRILLATION, ATRIAL 06/23/2008  . 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 KIDNEY DISEASE 06/23/2008   Outpatient Encounter Prescriptions as of 07/22/2013  Medication Sig  . atorvastatin (LIPITOR) 10 MG tablet Take 5 mg by mouth daily.    [name] CINNAMON PO Take by mouth at bedtime.  [name] co-enzyme Q-10 30 MG capsule Take 100 mg by mouth daily.   [name] diltiazem (CARDIZEM CD) 180 MG 24 hr capsule TAKE 1 CAPSULE BY MOUTH DAILY  . glimepiride (AMARYL) 4 MG tablet Take 1 tablet (4 mg total) by mouth daily before breakfast.  . Hydrocodone-Acetaminophen 5-300 MG TABS Take 1 tablet by mouth every 6 (six) hours as needed.  . metFORMIN (GLUCOPHAGE) 500 MG tablet Take 2 tablets (1,000 mg total) by mouth 2 (two) times daily with a meal.  . sotalol (BETAPACE) 80 MG tablet Take 0.5 tablets (40 mg  total) by mouth 2 (two) times daily.  [name] warfarin (COUMADIN) 2.5 MG tablet Take 1 to 1 & 1/2 tablets by mouth daily as directed.  . sildenafil (VIAGRA) 100 MG tablet Take 100 mg by mouth daily as needed.      Review of Systems  Constitutional: Positive for unexpected weight change (weight loss).  HENT: Negative.   Eyes: Negative.   Respiratory: Negative.   Cardiovascular: Negative.   Gastrointestinal: Negative.   Endocrine: Negative.   Genitourinary: Negative.   Musculoskeletal: Negative.   Skin: Negative.   Allergic/Immunologic: Negative.   Neurological: Negative.   Hematological: Negative.   Psychiatric/Behavioral: Negative.        Objective:   Physical Exam  Nursing note and vitals reviewed. Constitutional: He is oriented to person, place, and time. He appears well-developed. No distress.  Somewhat thin  HENT:  Head: Normocephalic and atraumatic.  Right Ear: External ear normal.  Left Ear: External ear normal.  Nose: Nose normal.  Mouth/Throat: Oropharynx is clear and moist. No oropharyngeal exudate.  Eyes: Conjunctivae and EOM are normal. Pupils are equal, round, and reactive to light. Right eye exhibits no discharge. Left eye exhibits no discharge. No scleral icterus.  Neck: Normal range of motion. Neck supple. No thyromegaly present.  No carotid bruits  Cardiovascular: Normal rate, regular rhythm, normal heart sounds and intact distal pulses.  Exam reveals no gallop and no friction rub.  No murmur heard. At 60 per minute  Pulmonary/Chest: Effort normal and breath sounds normal. No respiratory distress. He has no wheezes. He has no rales. He exhibits no tenderness.  Abdominal: Soft. Bowel sounds are normal. He exhibits no mass. There is no tenderness. There is no rebound and no guarding.  Genitourinary:  The rectal and prostate exams were done in November 2014  Musculoskeletal: Normal range of motion. He exhibits no edema and no tenderness.  The patient has had  multiple knee surgeries on his left knee. His mobility appears good  Lymphadenopathy:    He has no cervical adenopathy.  Neurological: He is alert and oriented to person, place, and time. He has normal reflexes. No cranial nerve deficit.  Skin: Skin is warm and dry. No rash noted. No erythema. No pallor.  Psychiatric: He has a normal mood and affect. His behavior is normal. Judgment and thought content normal.   BP 103/69  Pulse 67  Temp(Src) 98.4 F (36.9 C) (Oral)  Ht 6\' 1"  (1.854 m)  Wt 146 lb (66.225 kg)  BMI 19.27 kg/m2        Assessment & Plan:  1. HYPERLIPIDEMIA  2. HYPERTENSION  3. KIDNEY DISEASE  4. Type 2 diabetes mellitus without complication - POCT UA - Microalbumin -Keep appointment with clinical pharmacist  Patient Instructions  Continue close followup with the clinical pharmacist Monitor your blood sugars regularly and bring these blood sugars in for review with her Always be careful and do not put yourself at risk for falls Drink plenty of water this summer Do not forget to get your eye exam done. Keep followup appointments with pulmonologist and cardiologist   Arrie Senate MD

## 2013-07-22 NOTE — Patient Instructions (Addendum)
Continue close followup with the clinical pharmacist Monitor your blood sugars regularly and bring these blood sugars in for review with her Always be careful and do not put yourself at risk for falls Drink plenty of water this summer Do not forget to get your eye exam done. Keep followup appointments with pulmonologist and cardiologist

## 2013-07-26 NOTE — Telephone Encounter (Signed)
Called patient regarding appointment he is OK with coming on 6/30

## 2013-08-02 ENCOUNTER — Encounter: Payer: Self-pay | Admitting: Pharmacist Clinician (PhC)/ Clinical Pharmacy Specialist

## 2013-08-02 ENCOUNTER — Ambulatory Visit (INDEPENDENT_AMBULATORY_CARE_PROVIDER_SITE_OTHER): Payer: Medicare Other | Admitting: Pharmacist Clinician (PhC)/ Clinical Pharmacy Specialist

## 2013-08-02 DIAGNOSIS — Z7901 Long term (current) use of anticoagulants: Secondary | ICD-10-CM | POA: Diagnosis not present

## 2013-08-02 DIAGNOSIS — E119 Type 2 diabetes mellitus without complications: Secondary | ICD-10-CM

## 2013-08-02 DIAGNOSIS — I4891 Unspecified atrial fibrillation: Secondary | ICD-10-CM | POA: Diagnosis not present

## 2013-08-02 LAB — POCT INR: INR: 2.1

## 2013-08-02 NOTE — Progress Notes (Signed)
Reviewed BG readings on today's visit, they are excellent- averaging 115mg /dL.  He feels great and has no complaints.  Will continue amaryl 4mg  until his next A1C is checked in August.

## 2013-08-09 ENCOUNTER — Other Ambulatory Visit: Payer: Self-pay | Admitting: Pharmacist Clinician (PhC)/ Clinical Pharmacy Specialist

## 2013-08-11 ENCOUNTER — Other Ambulatory Visit: Payer: Self-pay | Admitting: Family Medicine

## 2013-08-11 NOTE — Telephone Encounter (Signed)
Rx refill denied to pharmacy, we no longer monitor this medication

## 2013-08-23 ENCOUNTER — Ambulatory Visit (INDEPENDENT_AMBULATORY_CARE_PROVIDER_SITE_OTHER): Payer: Medicare Other | Admitting: Pharmacist

## 2013-08-23 ENCOUNTER — Encounter: Payer: Self-pay | Admitting: Pharmacist

## 2013-08-23 DIAGNOSIS — I4891 Unspecified atrial fibrillation: Secondary | ICD-10-CM | POA: Diagnosis not present

## 2013-08-23 DIAGNOSIS — E119 Type 2 diabetes mellitus without complications: Secondary | ICD-10-CM

## 2013-08-23 LAB — POCT INR: INR: 2

## 2013-08-23 LAB — POCT GLYCOSYLATED HEMOGLOBIN (HGB A1C): Hemoglobin A1C: 7.6

## 2013-08-23 NOTE — Patient Instructions (Signed)
Anticoagulation Dose Instructions as of 08/23/2013     Dorene Grebe Tue Wed Thu Fri Sat   New Dose 2.5 mg 3.75 mg 2.5 mg 3.75 mg 2.5 mg 3.75 mg 3.75 mg    Description       Continue 1 table ton sundays, tuesdays and thursdays.  Take 1 and 1/2 tablets all other days      INR was 2.0 today

## 2013-08-29 DIAGNOSIS — D235 Other benign neoplasm of skin of trunk: Secondary | ICD-10-CM | POA: Diagnosis not present

## 2013-08-29 DIAGNOSIS — Z85820 Personal history of malignant melanoma of skin: Secondary | ICD-10-CM | POA: Diagnosis not present

## 2013-09-08 DIAGNOSIS — E119 Type 2 diabetes mellitus without complications: Secondary | ICD-10-CM | POA: Diagnosis not present

## 2013-09-08 DIAGNOSIS — H1045 Other chronic allergic conjunctivitis: Secondary | ICD-10-CM | POA: Diagnosis not present

## 2013-09-08 DIAGNOSIS — H35379 Puckering of macula, unspecified eye: Secondary | ICD-10-CM | POA: Diagnosis not present

## 2013-09-08 DIAGNOSIS — H11159 Pinguecula, unspecified eye: Secondary | ICD-10-CM | POA: Diagnosis not present

## 2013-09-20 ENCOUNTER — Telehealth: Payer: Self-pay | Admitting: Pharmacist Clinician (PhC)/ Clinical Pharmacy Specialist

## 2013-09-20 ENCOUNTER — Ambulatory Visit (INDEPENDENT_AMBULATORY_CARE_PROVIDER_SITE_OTHER): Payer: Medicare Other | Admitting: Pharmacist Clinician (PhC)/ Clinical Pharmacy Specialist

## 2013-09-20 DIAGNOSIS — I4891 Unspecified atrial fibrillation: Secondary | ICD-10-CM | POA: Diagnosis not present

## 2013-09-20 LAB — POCT INR: INR: 2.5

## 2013-09-20 MED ORDER — CANAGLIFLOZIN 100 MG PO TABS: 100.0000 mg | ORAL_TABLET | Freq: Every morning | ORAL | Status: DC

## 2013-09-20 NOTE — Telephone Encounter (Signed)
Called patient back about questions regarding INvokana and kidney problems.  Explained that renal function had to be normal to take this medication and risk of UTIs, etc.

## 2013-09-30 ENCOUNTER — Other Ambulatory Visit: Payer: Self-pay | Admitting: Family Medicine

## 2013-10-04 ENCOUNTER — Telehealth: Payer: Self-pay | Admitting: Pharmacist Clinician (PhC)/ Clinical Pharmacy Specialist

## 2013-10-04 NOTE — Telephone Encounter (Signed)
Patient is having a good response to Invokana and would like 4 more weeks of samples.  He has not had any weight loss yet and has been drinking lots of water.  I cautioned him that if he does experience weight loss we can switch to Lantus.

## 2013-10-11 ENCOUNTER — Ambulatory Visit (INDEPENDENT_AMBULATORY_CARE_PROVIDER_SITE_OTHER): Payer: Medicare Other | Admitting: Pharmacist Clinician (PhC)/ Clinical Pharmacy Specialist

## 2013-10-11 DIAGNOSIS — I4891 Unspecified atrial fibrillation: Secondary | ICD-10-CM

## 2013-10-11 DIAGNOSIS — E119 Type 2 diabetes mellitus without complications: Secondary | ICD-10-CM

## 2013-10-11 MED ORDER — INSULIN GLARGINE 100 UNIT/ML SOLOSTAR PEN: 10.0000 [IU] | PEN_INJECTOR | Freq: Every day | SUBCUTANEOUS | Status: DC

## 2013-10-11 NOTE — Progress Notes (Signed)
Patient comes in today to change medications for treatment of his type 2 diabetes.  Patient is underweight and would prefer a medication that is associated with weight gain.  He tried Invokana and go great results with lowering his blood glucose but he did loose 3lbs and had extreme diuresis despite taking it in the morning.  He would go to urinate 6 times a night.  I discussed Lantus with him at our last visit and he would like to start it today.  I taught patient how to use the Lantus Solostar pen and used a demonstrator.  He will start with 5 units at bedtime with the option to increase to 10 units based on BG readings.  Current readings on INvokana are averaging around 110mg /dL.  Invokana was discontinued, last dose was yesterday morning.  Patient will continue to take glimepiride.  Will follow up with him at next protime visit, he is to call sooner with any concerns.  Reviewed signs and symptoms of hypoglycemia and appropriate treatment of it.

## 2013-10-14 ENCOUNTER — Encounter: Payer: Self-pay | Admitting: Family Medicine

## 2013-10-14 ENCOUNTER — Telehealth: Payer: Self-pay | Admitting: Pharmacist

## 2013-10-14 NOTE — Telephone Encounter (Signed)
Received My Chart message that patient does not think that he administered Lantus insulin correctly last night. I tried to call to see if I could talk patient through proper administration - no answer so left message to call office to discuss.

## 2013-10-17 NOTE — Telephone Encounter (Signed)
Called patient to discuss Lantus.  He was unavailable so I spoke with his wife.  She states that patient is not administering Lantus without any problems.  He stated with 5 units daily and has increased to 6 units.  He has had reading in the 140's the last 2 mornings (down from 200's). I instructed wife to continue to check BG qam - if BG is still over 120 and he has not had any episodes of hypoglycemia over the next 2 days he can increase to 7 units daily.

## 2013-11-01 ENCOUNTER — Telehealth: Payer: Self-pay | Admitting: Pharmacist Clinician (PhC)/ Clinical Pharmacy Specialist

## 2013-11-01 NOTE — Telephone Encounter (Signed)
Called patient and home BG readings are staying around 120-140.  He is taking 8 units at bedtime and will increase to 10 units to get optimal control.  I see him next week for a protime and will assess how he is doing at that time.

## 2013-11-06 ENCOUNTER — Other Ambulatory Visit: Payer: Self-pay | Admitting: Pharmacist Clinician (PhC)/ Clinical Pharmacy Specialist

## 2013-11-07 ENCOUNTER — Other Ambulatory Visit: Payer: Self-pay | Admitting: Cardiovascular Disease

## 2013-11-08 ENCOUNTER — Ambulatory Visit (INDEPENDENT_AMBULATORY_CARE_PROVIDER_SITE_OTHER): Payer: Medicare Other | Admitting: Pharmacist Clinician (PhC)/ Clinical Pharmacy Specialist

## 2013-11-08 DIAGNOSIS — I4891 Unspecified atrial fibrillation: Secondary | ICD-10-CM

## 2013-11-08 LAB — POCT INR: INR: 1.4

## 2013-11-09 ENCOUNTER — Other Ambulatory Visit: Payer: Self-pay | Admitting: Family Medicine

## 2013-11-22 ENCOUNTER — Telehealth: Payer: Self-pay | Admitting: Pharmacist Clinician (PhC)/ Clinical Pharmacy Specialist

## 2013-11-22 NOTE — Telephone Encounter (Signed)
Patient is on vacation in [place] and forgot to take 10 units of Lantus last night.  Home blood sugars are averaging between 100-130.  I instructed him to take 5 units this morning and the other 5 units this evening and then resume tomorrow night with his regular dose of 10 units qhs.

## 2013-11-29 ENCOUNTER — Ambulatory Visit (INDEPENDENT_AMBULATORY_CARE_PROVIDER_SITE_OTHER): Payer: Medicare Other | Admitting: Family Medicine

## 2013-11-29 ENCOUNTER — Telehealth: Payer: Self-pay | Admitting: Pharmacist Clinician (PhC)/ Clinical Pharmacy Specialist

## 2013-11-29 ENCOUNTER — Encounter: Payer: Self-pay | Admitting: Family Medicine

## 2013-11-29 VITALS — BP 128/78 | HR 63 | Temp 97.2°F | Ht 73.0 in | Wt 146.0 lb

## 2013-11-29 DIAGNOSIS — Z0000 Encounter for general adult medical examination without abnormal findings: Secondary | ICD-10-CM

## 2013-11-29 DIAGNOSIS — I480 Paroxysmal atrial fibrillation: Secondary | ICD-10-CM | POA: Diagnosis not present

## 2013-11-29 DIAGNOSIS — E559 Vitamin D deficiency, unspecified: Secondary | ICD-10-CM

## 2013-11-29 DIAGNOSIS — E785 Hyperlipidemia, unspecified: Secondary | ICD-10-CM

## 2013-11-29 DIAGNOSIS — K432 Incisional hernia without obstruction or gangrene: Secondary | ICD-10-CM

## 2013-11-29 DIAGNOSIS — E118 Type 2 diabetes mellitus with unspecified complications: Secondary | ICD-10-CM | POA: Diagnosis not present

## 2013-11-29 DIAGNOSIS — I10 Essential (primary) hypertension: Secondary | ICD-10-CM

## 2013-11-29 DIAGNOSIS — I4891 Unspecified atrial fibrillation: Secondary | ICD-10-CM

## 2013-11-29 LAB — POCT URINALYSIS DIPSTICK
BILIRUBIN UA: NEGATIVE
KETONES UA: NEGATIVE
Leukocytes, UA: NEGATIVE
NITRITE UA: NEGATIVE
RBC UA: NEGATIVE
Spec Grav, UA: 1.015
Urobilinogen, UA: NEGATIVE
pH, UA: 5

## 2013-11-29 LAB — POCT UA - MICROSCOPIC ONLY
Casts, Ur, LPF, POC: NEGATIVE
Crystals, Ur, HPF, POC: NEGATIVE
MUCUS UA: NEGATIVE
YEAST UA: NEGATIVE

## 2013-11-29 LAB — POCT INR: INR: 1.5

## 2013-11-29 NOTE — Addendum Note (Signed)
Addended by: Zannie Cove on: 11/29/2013 09:58 AM   Modules accepted: Orders

## 2013-11-29 NOTE — Telephone Encounter (Signed)
INR today is 1.5, increased patient to 3.75mg  (1 1/2 tablets) a day.  Re-check in 1 week.  Was on vacation last week and ate differently

## 2013-11-29 NOTE — Progress Notes (Signed)
Subjective:    [name]: Christopher Avery, male    DOB: 07/12/1950, 63 y.o.   MRN: [id number]  HPI Pt here for follow up and management of chronic medical problems. The patient is here for his annual exam. He is still having problems with his blood sugar and that it is running too high. He has no other complaints. He is due for a rectal exam and PSA. He will return to the office for fasting lab work. The Prevnar vaccine is viewed but it is too soon because of a recent flu shot. He will also get his pro time today. The patient indicates that he recently took an extended trip and while on the trip did not watch his diet as closely and that this is the reason his blood sugars got out of control. He has been back home a few days and things are coming under better control.          Patient Active Problem List   Diagnosis Date Noted  . Conjunctival abrasion 09/14/2012  . Foreign body of left eye 09/13/2012  . Corneal abrasion 09/13/2012  . Type 2 diabetes mellitus 07/29/2012  . Long term (current) use of anticoagulants 04/22/2012  . Hyperlipidemia 06/23/2008  . Benign essential HTN 06/23/2008  . FIBRILLATION, ATRIAL 06/23/2008  . 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 KIDNEY DISEASE 06/23/2008   Outpatient Encounter Prescriptions as of 11/29/2013  Medication Sig  . atorvastatin (LIPITOR) 10 MG tablet Take 5 mg by mouth daily.    [name] CINNAMON PO Take by mouth at bedtime.  [name] co-enzyme Q-10 30 MG capsule Take 100 mg by mouth daily.   [name] diltiazem (CARDIZEM CD) 180 MG 24 hr capsule TAKE 1 CAPSULE BY MOUTH DAILY  . glimepiride (AMARYL) 4 MG tablet TAKE 1 TABLET (4 MG TOTAL) BY MOUTH DAILY BEFORE BREAKFAST.  [name] Insulin Glargine (LANTUS SOLOSTAR) 100 UNIT/ML Solostar Pen Inject 10 Units into the skin daily at 10 pm.  . sotalol (BETAPACE) 80 MG tablet Take 0.5 tablets (40 mg total) by mouth 2 (two) times daily.  . valACYclovir (VALTREX) 500 MG tablet   . warfarin (COUMADIN) 2.5 MG tablet TAKE 1 TO  1 & 1/2 TABLETS BY MOUTH DAILY AS DIRECTED.  [name] Hydrocodone-Acetaminophen 5-300 MG TABS Take 1 tablet by mouth every 6 (six) hours as needed.  . sildenafil (VIAGRA) 100 MG tablet Take 100 mg by mouth daily as needed.    . [DISCONTINUED] warfarin (COUMADIN) 2.5 MG tablet TAKE 1 TO 1 & 1/2 TABLETS BY MOUTH DAILY AS DIRECTED.    Review of Systems  Constitutional: Negative.   HENT: Negative.   Eyes: Negative.   Respiratory: Negative.   Cardiovascular: Negative.   Gastrointestinal: Negative.   Endocrine: Negative.        Sugars running higher  Genitourinary: Negative.   Musculoskeletal: Negative.   Skin: Negative.   Allergic/Immunologic: Negative.   Neurological: Negative.   Hematological: Negative.   Psychiatric/Behavioral: Negative.        Objective:   Physical Exam  Nursing note and vitals reviewed. Constitutional: He is oriented to person, place, and time. He appears well-developed and well-nourished. No distress.  Somewhat thin in appearance, but alert and pleasant.  HENT:  Head: Normocephalic and atraumatic.  Right Ear: External ear normal.  Left Ear: External ear normal.  Mouth/Throat: Oropharynx is clear and moist. No oropharyngeal exudate.  Minimal nasal congestion bilaterally  Eyes: Conjunctivae and EOM are normal. Pupils are equal, round, and reactive to light.  Right eye exhibits no discharge. Left eye exhibits no discharge. No scleral icterus.  Neck: Normal range of motion. Neck supple. No thyromegaly present.  No carotid bruits and no cervical adenopathy  Cardiovascular: Normal rate, regular rhythm, normal heart sounds and intact distal pulses.  Exam reveals no gallop and no friction rub.   No murmur heard. At 60/m with a regular rate and rhythm  Pulmonary/Chest: Effort normal and breath sounds normal. No respiratory distress. He has no wheezes. He has no rales. He exhibits no tenderness.  No axillary adenopathy  Abdominal: Soft. Bowel sounds are normal. He exhibits  no mass. There is no tenderness. There is no rebound and no guarding.  There are no abdominal bruits. No inguinal adenopathy. There is an incisional hernia in the area of his previous hernia repair on the left side.  Genitourinary: Rectum normal and penis normal.  The prostate is enlarged and smooth. There were no lumps or masses. There are no rectal masses. There is an incisional hernia as mentioned above. The external genitalia were normal and the testicles were normal bilaterally. He does have external hemorrhoids.  Musculoskeletal: Normal range of motion. He exhibits no edema and no tenderness.  The patient has had previous knee replacement in the left knee.  Lymphadenopathy:    He has no cervical adenopathy.  Neurological: He is alert and oriented to person, place, and time. He has normal reflexes. No cranial nerve deficit.  Skin: Skin is warm and dry. No rash noted. No erythema. No pallor.  Psychiatric: He has a normal mood and affect. His behavior is normal. Judgment and thought content normal.   BP 128/78  Pulse 63  Temp(Src) 97.2 F (36.2 C) (Oral)  Ht _0  (1.854 m)  Wt 146 lb (66.225 kg)  BMI 19.27 kg/m2        Assessment & Plan:  1. Hyperlipidemia - POCT CBC; Future - NMR, lipoprofile; Future  2. Atrial fibrillation, unspecified - POCT CBC; Future  3. Benign essential HTN - POCT CBC; Future - BMP8+EGFR; Future - Hepatic function panel; Future  4. Type 2 diabetes mellitus with complication - POCT CBC; Future - POCT glycosylated hemoglobin (Hb A1C); Future  5. Annual physical exam - POCT CBC; Future - POCT UA - Microscopic Only - POCT urinalysis dipstick - BMP8+EGFR; Future - Hepatic function panel; Future - PSA, total and free; Future - NMR, lipoprofile; Future - Vit D  25 hydroxy (rtn osteoporosis monitoring); Future  6. Vitamin D deficiency - Vit D  25 hydroxy (rtn osteoporosis monitoring); Future  7. Atrial fibrillation - POCT INR  8. Incisional  hernia, without obstruction or gangrene   Patient Instructions  Continue current medications. Continue good therapeutic lifestyle changes which include good diet and exercise. Fall precautions discussed with patient. If an FOBT was given today- please return it to our front desk. If you are over 72 years old - you may need Prevnar 35 or the adult Pneumonia vaccine.  Flu Shots will be available at our office starting mid- September. Please call and schedule a FLU CLINIC APPOINTMENT.   Continue with exercise regimen and good therapeutic lifestyle changes including diet habits. We will arrange for you to have a visit with Dr. Alphonsa Overall to look at your incisional hernia and possibly repair this. If you develop any severe pain in this area you should go to the emergency room immediately. Return to clinic as directed by the nurses for your Prevnar vaccine. Continue follow-up with clinical pharmacist regarding  good diabetes control   Arrie Senate MD

## 2013-11-29 NOTE — Patient Instructions (Addendum)
Continue current medications. Continue good therapeutic lifestyle changes which include good diet and exercise. Fall precautions discussed with patient. If an FOBT was given today- please return it to our front desk. If you are over 62 years old - you may need Prevnar 59 or the adult Pneumonia vaccine.  Flu Shots will be available at our office starting mid- September. Please call and schedule a FLU CLINIC APPOINTMENT.   Continue with exercise regimen and good therapeutic lifestyle changes including diet habits. We will arrange for you to have a visit with Dr. Alphonsa Overall to look at your incisional hernia and possibly repair this. If you develop any severe pain in this area you should go to the emergency room immediately. Return to clinic as directed by the nurses for your Prevnar vaccine. Continue follow-up with clinical pharmacist regarding good diabetes control

## 2013-12-05 ENCOUNTER — Other Ambulatory Visit (INDEPENDENT_AMBULATORY_CARE_PROVIDER_SITE_OTHER): Payer: Medicare Other

## 2013-12-05 DIAGNOSIS — E559 Vitamin D deficiency, unspecified: Secondary | ICD-10-CM

## 2013-12-05 DIAGNOSIS — I4891 Unspecified atrial fibrillation: Secondary | ICD-10-CM

## 2013-12-05 DIAGNOSIS — E785 Hyperlipidemia, unspecified: Secondary | ICD-10-CM | POA: Diagnosis not present

## 2013-12-05 DIAGNOSIS — E118 Type 2 diabetes mellitus with unspecified complications: Secondary | ICD-10-CM | POA: Diagnosis not present

## 2013-12-05 DIAGNOSIS — Z0000 Encounter for general adult medical examination without abnormal findings: Secondary | ICD-10-CM | POA: Diagnosis not present

## 2013-12-05 DIAGNOSIS — I10 Essential (primary) hypertension: Secondary | ICD-10-CM

## 2013-12-05 DIAGNOSIS — Z125 Encounter for screening for malignant neoplasm of prostate: Secondary | ICD-10-CM | POA: Diagnosis not present

## 2013-12-05 LAB — POCT CBC
GRANULOCYTE PERCENT: 59.9 % (ref 37–80)
HEMATOCRIT: 43.4 % — AB (ref 43.5–53.7)
Hemoglobin: 14.3 g/dL (ref 14.1–18.1)
LYMPH, POC: 1.8 (ref 0.6–3.4)
MCH: 29.4 pg (ref 27–31.2)
MCHC: 33 g/dL (ref 31.8–35.4)
MCV: 88.9 fL (ref 80–97)
MPV: 7.4 fL (ref 0–99.8)
PLATELET COUNT, POC: 187 10*3/uL (ref 142–424)
POC Granulocyte: 2.7 (ref 2–6.9)
POC LYMPH PERCENT: 38.9 %L (ref 10–50)
RBC: 4.9 M/uL (ref 4.69–6.13)
RDW, POC: 11.9 %
WBC: 4.5 10*3/uL — AB (ref 4.6–10.2)

## 2013-12-05 LAB — POCT GLYCOSYLATED HEMOGLOBIN (HGB A1C): Hemoglobin A1C: 9.1

## 2013-12-05 NOTE — Progress Notes (Signed)
Lab only 

## 2013-12-06 ENCOUNTER — Ambulatory Visit (INDEPENDENT_AMBULATORY_CARE_PROVIDER_SITE_OTHER): Payer: Medicare Other | Admitting: Pharmacist Clinician (PhC)/ Clinical Pharmacy Specialist

## 2013-12-06 DIAGNOSIS — I4891 Unspecified atrial fibrillation: Secondary | ICD-10-CM

## 2013-12-06 LAB — HEPATIC FUNCTION PANEL
ALBUMIN: 4.2 g/dL (ref 3.6–4.8)
ALK PHOS: 51 IU/L (ref 39–117)
ALT: 30 IU/L (ref 0–44)
AST: 25 IU/L (ref 0–40)
Bilirubin, Direct: 0.13 mg/dL (ref 0.00–0.40)
Total Bilirubin: 0.4 mg/dL (ref 0.0–1.2)
Total Protein: 6.2 g/dL (ref 6.0–8.5)

## 2013-12-06 LAB — NMR, LIPOPROFILE
CHOLESTEROL: 114 mg/dL (ref 100–199)
HDL Cholesterol by NMR: 37 mg/dL — ABNORMAL LOW (ref >39)
HDL Particle Number: 29.9 umol/L — ABNORMAL LOW (ref >=30.5)
LDL Particle Number: 692 nmol/L (ref <1000)
LDL SIZE: 20.4 nm (ref >20.5)
LDL-C: 68 mg/dL (ref 0–99)
LP-IR Score: 42 (ref <=45)
SMALL LDL PARTICLE NUMBER: 318 nmol/L (ref <=527)
TRIGLYCERIDES BY NMR: 47 mg/dL (ref 0–149)

## 2013-12-06 LAB — PSA, TOTAL AND FREE
PSA FREE PCT: 22 %
PSA FREE: 0.11 ng/mL
PSA: 0.5 ng/mL (ref 0.0–4.0)

## 2013-12-06 LAB — BMP8+EGFR
BUN / CREAT RATIO: 12 (ref 10–22)
BUN: 11 mg/dL (ref 8–27)
CO2: 25 mmol/L (ref 18–29)
Calcium: 9.4 mg/dL (ref 8.6–10.2)
Chloride: 101 mmol/L (ref 97–108)
Creatinine, Ser: 0.91 mg/dL (ref 0.76–1.27)
GFR calc non Af Amer: 90 mL/min/{1.73_m2} (ref >59)
GFR, EST AFRICAN AMERICAN: 104 mL/min/{1.73_m2} (ref >59)
Glucose: 144 mg/dL — ABNORMAL HIGH (ref 65–99)
POTASSIUM: 4.6 mmol/L (ref 3.5–5.2)
Sodium: 140 mmol/L (ref 134–144)

## 2013-12-06 LAB — VITAMIN D 25 HYDROXY (VIT D DEFICIENCY, FRACTURES): Vit D, 25-Hydroxy: 50.9 ng/mL (ref 30.0–100.0)

## 2013-12-06 LAB — POCT INR: INR: 2.9

## 2013-12-09 ENCOUNTER — Other Ambulatory Visit: Payer: Self-pay | Admitting: *Deleted

## 2013-12-09 DIAGNOSIS — R7309 Other abnormal glucose: Secondary | ICD-10-CM

## 2013-12-09 DIAGNOSIS — R634 Abnormal weight loss: Secondary | ICD-10-CM

## 2013-12-09 NOTE — Progress Notes (Signed)
Due to weight loss and blood sugars - abnormal -  DWM and TBE - requested ct - abd to evaluate

## 2013-12-11 ENCOUNTER — Other Ambulatory Visit: Payer: Self-pay | Admitting: Cardiovascular Disease

## 2013-12-14 ENCOUNTER — Encounter (INDEPENDENT_AMBULATORY_CARE_PROVIDER_SITE_OTHER): Payer: Self-pay | Admitting: Surgery

## 2013-12-14 ENCOUNTER — Other Ambulatory Visit (INDEPENDENT_AMBULATORY_CARE_PROVIDER_SITE_OTHER): Payer: Self-pay | Admitting: Surgery

## 2013-12-14 DIAGNOSIS — K4091 Unilateral inguinal hernia, without obstruction or gangrene, recurrent: Secondary | ICD-10-CM | POA: Diagnosis not present

## 2013-12-14 NOTE — H&P (Signed)
Christopher Avery. [name] 12/14/2013 10:34 AM Location: [facility] Patient #: [id number] DOB: August 25, 1950 Married / Language: English / Race: White Male  History of Present Illness Christopher Hector MD; 12/14/2013 1:54 PM) The patient is a 63 year old male who presents with an inguinal hernia. Pleasant active male with chronic cardiac, pulmonary, and endocrine disease. Recalls getting inguinal hernia repair as a teenager back in the 1960s. He's noticed a lump in his left groin for the past few months. Not particularly painful. Occasionally sensitive but not severe. He can walk 2 miles without difficulty. He no longer smokes. He does have chronic obstructive pulmonary disease followed by Dr. Gwenette Avery. That is been stable. History of atrial fibrillation recently diagnosed. follwed by [name]. On chronic warfarin anticoagulation. No history of stroke nor heart attack. History of diabetes. Switched to Lantus with improvement in his hemoglobin A1c to around 7 now. No history of urinary tract infections or UTIs. Because of the bulging, he saw his primary care physician. Dr. Laurance Avery requested surgical evaluation. Patient urinates once or twice a night for the past several decades. No history of urinary tract infections. He's had a lot of surgeries on his left knee in town & at [place]. Has pretty good walking activity now.   Other Problems Christopher Mage Spillers, MA; 12/14/2013 10:50 AM) Back Pain Chronic Renal Failure Syndrome Diabetes Mellitus Heart murmur High blood pressure Melanoma Ventral Hernia Repair  Past Surgical History Christopher Mage Spillers, MA; 12/14/2013 10:50 AM) Dialysis Shunt / Fistula Knee Surgery Left. Open Inguinal Hernia Surgery Bilateral. Oral Surgery Resection of Stomach Vasectomy  Allergies Christopher Mage Spillers, MA; 12/14/2013 10:36 AM) No Known Drug Allergies11/12/2013  Medication History (Christopher Spillers, MA; 12/14/2013 10:40 AM) Cinnamon (500MG   Tablet, Oral) Active. Co-Enzyme Q-10 (30MG  Capsule, Oral) Active. Diltiazem CD (180MG  Capsule ER 24HR, Oral) Active. Glimepiride (4MG  Tablet, Oral) Active. Hydrocodone-Acetaminophen (5-300MG  Tablet, Oral) Active. Insulin Glargine (100UNIT/ML Soln Pen-inj, Subcutaneous) Active. Lipitor (10MG  Tablet, Oral) Active. Sildenafil Citrate (100MG  Tablet, Oral) Active. Sotalol HCl (AF) (80MG  Tablet, Oral) Active. ValACYclovir HCl (500MG  Tablet, Oral) Active. Coumadin (2.5MG  Tablet, Oral) Active. Medications Reconciled  Social History Christopher Avery, [place]; 12/14/2013 10:50 AM) Caffeine use Coffee, Tea. No alcohol use No drug use Tobacco use Never smoker.  Family History Christopher Avery, [place]; 12/14/2013 10:50 AM) Heart Disease Father. Kidney Disease Father.  Review of Systems (Christopher Spillers MA; 12/14/2013 10:51 AM) General Not Present- Appetite Loss, Chills, Fatigue, Fever, Night Sweats, Weight Gain and Weight Loss. Skin Not Present- Change in Wart/Mole, Dryness, Hives, Jaundice, New Lesions, Non-Healing Wounds, Rash and Ulcer. HEENT Present- Wears glasses/contact lenses. Not Present- Earache, Hearing Loss, Hoarseness, Nose Bleed, Oral Ulcers, Ringing in the Ears, Seasonal Allergies, Sinus Pain, Sore Throat, Visual Disturbances and Yellow Eyes. Respiratory Not Present- Bloody sputum, Chronic Cough, Difficulty Breathing, Snoring and Wheezing. Breast Not Present- Breast Mass, Breast Pain, Nipple Discharge and Skin Changes. Cardiovascular Not Present- Chest Pain, Difficulty Breathing Lying Down, Leg Cramps, Palpitations, Rapid Heart Rate, Shortness of Breath and Swelling of Extremities. Gastrointestinal Present- Hemorrhoids. Not Present- Abdominal Pain, Bloating, Bloody Stool, Change in Bowel Habits, Chronic diarrhea, Constipation, Difficulty Swallowing, Excessive gas, Gets full quickly at meals, Indigestion, Nausea, Rectal Pain and Vomiting. Male Genitourinary Not Present- Blood  in Urine, Change in Urinary Stream, Frequency, Impotence, Nocturia, Painful Urination, Urgency and Urine Leakage.   Vitals (Christopher Spillers MA; 12/14/2013 10:36 AM) 12/14/2013 10:35 AM Weight: 147 lb Height: 73in Body Surface Area: 1.85 m Body Mass Index: 19.39 kg/m BP: 104/70 (Sitting,  Left Arm, Standard)    Physical Exam Christopher Hector MD; 12/14/2013 1:50 PM) General Mental Status-Alert. General Appearance-Not in acute distress, Not Sickly. Orientation-Oriented X3. Hydration-Well hydrated. Voice-Normal.  Integumentary Global Assessment Upon inspection and palpation of skin surfaces of the - Axillae: non-tender, no inflammation or ulceration, no drainage. and Distribution of scalp and body hair is normal. General Characteristics Temperature - normal warmth is noted.  Head and Neck Head-normocephalic, atraumatic with no lesions or palpable masses. Face Global Assessment - atraumatic, no absence of expression. Neck Global Assessment - no abnormal movements, no bruit auscultated on the right, no bruit auscultated on the left, no decreased range of motion, non-tender. Trachea-midline. Thyroid Gland Characteristics - non-tender.  Eye Eyeball - Left-Extraocular movements intact, No Nystagmus. Eyeball - Right-Extraocular movements intact, No Nystagmus. Cornea - Left-No Hazy. Cornea - Right-No Hazy. Sclera/Conjunctiva - Left-No scleral icterus, No Discharge. Sclera/Conjunctiva - Right-No scleral icterus, No Discharge. Pupil - Left-Direct reaction to light normal. Pupil - Right-Direct reaction to light normal.  ENMT Ears Pinna - Left - no drainage observed, no generalized tenderness observed. Right - no drainage observed, no generalized tenderness observed. Nose and Sinuses External Inspection of the Nose - no destructive lesion observed. Inspection of the nares - Left - quiet respiration. Right - quiet respiration. Mouth and  Throat Lips - Upper Lip - no fissures observed, no pallor noted. Lower Lip - no fissures observed, no pallor noted. Nasopharynx - no discharge present. Oral Cavity/Oropharynx - Tongue - no dryness observed. Oral Mucosa - no cyanosis observed. Hypopharynx - no evidence of airway distress observed.  Chest and Lung Exam Inspection Movements - Normal and Symmetrical. Accessory muscles - No use of accessory muscles in breathing. Palpation Palpation of the chest reveals - Non-tender. Auscultation Breath sounds - Normal and Clear.  Cardiovascular Auscultation Rhythm - Regular. Murmurs & Other Heart Sounds - Auscultation of the heart reveals - No Murmurs and No Systolic Clicks.  Abdomen Inspection Inspection of the abdomen reveals - No Visible peristalsis and No Abnormal pulsations. Umbilicus - No Bleeding, No Urine drainage. Palpation/Percussion Palpation and Percussion of the abdomen reveal - Soft, Non Tender, No Rebound tenderness, No Rigidity (guarding) and No Cutaneous hyperesthesia.  Male Genitourinary Sexual Maturity Tanner 5 - Adult hair pattern and Adult penile size and shape. Note: Normal external male genitalia. Circumcised. Old scar in left groin. Lateral reducible mass consistent with an indirect inguinal hernia. Mild sensitivity on the right pelvic floor but no definite hernia on right inguinal region.   Peripheral Vascular Upper Extremity Inspection - Left - No Cyanotic nailbeds, Not Ischemic. Right - No Cyanotic nailbeds, Not Ischemic.  Neurologic Neurologic evaluation reveals -normal attention span and ability to concentrate, able to name objects and repeat phrases. Appropriate fund of knowledge , normal sensation and normal coordination. Mental Status Affect - not angry, not paranoid. Cranial Nerves-Normal Bilaterally. Gait-Normal.  Neuropsychiatric Mental status exam performed with findings of-able to articulate well with normal speech/language, rate, volume  and coherence, thought content normal with ability to perform basic computations and apply abstract reasoning and no evidence of hallucinations, delusions, obsessions or homicidal/suicidal ideation.  Musculoskeletal Global Assessment Spine, Ribs and Pelvis - no instability, subluxation or laxity. Right Upper Extremity - no instability, subluxation or laxity.  Lymphatic Head & Neck  General Head & Neck Lymphatics: Bilateral - Description - No Localized lymphadenopathy. Axillary  General Axillary Region: Bilateral - Description - No Localized lymphadenopathy. Femoral & Inguinal  Generalized Femoral & Inguinal Lymphatics: Left -  Description - No Localized lymphadenopathy. Right - Description - No Localized lymphadenopathy.    Assessment & Plan Christopher Hector MD; 12/14/2013 1:49 PM) UNILATERAL RECURRENT INGUINAL HERNIA WITHOUT OBSTRUCTION OR GANGRENE (550.91  K40.91) Impression: Recurrent left inguinal hernia. Not particularly larger symptomatic yet. Mild bulging on right side but no definite hernia there.  I think he would benefit from hernia repair given his excellent exercise tolerance. He does have numerous medical issues but they seem pretty well controlled. 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  Standard of care is to do a laparoscopic approach for recurrent hernia in my mind. There is increased risk of bruising and bleeding with this given warfarin. However I think it would provide a better repair. Also check right side to make sure no hernia there. He agrees.  Would like to get cardiac clearance from Dr Gwenlyn Found before and make sure we have a safe plan to transition off warfarin perioperatively. Minimaize stroke/cardiac/bleeding risks. Hopefully can just come off and restart the following day. If Lovenox bridge needed, that will need to be addressed as well. Good exercise tolerance. Don't feel strongly needs pulmonary clearance at this time. He is very active and is not on  oxygen. Current Plans  Schedule for Surgery  Will need cardiac clearance as well Pt Education - [facility] Pain Control (Ajay Strubel) Pt Education - [facility] Good Bowel Health (Sury Wentworth) Pt Education - [facility] Hernia Post-Op HCI (Winfrey Chillemi): discussed with patient and provided information. [facility] Consent - Hernia Repair Inguinal/Pelvic (Fran Mcree): discussed with patient and provided information.   Signed by Christopher Hector, MD (12/14/2013 1:56 PM)

## 2013-12-15 ENCOUNTER — Telehealth: Payer: Self-pay

## 2013-12-15 ENCOUNTER — Ambulatory Visit (HOSPITAL_COMMUNITY)
Admission: RE | Admit: 2013-12-15 | Discharge: 2013-12-15 | Disposition: A | Discharge status: Home / Self Care | Payer: Medicare Other | Source: Ambulatory Visit | Attending: Family Medicine | Admitting: Family Medicine

## 2013-12-15 DIAGNOSIS — R7309 Other abnormal glucose: Secondary | ICD-10-CM | POA: Diagnosis not present

## 2013-12-15 DIAGNOSIS — K7689 Other specified diseases of liver: Secondary | ICD-10-CM | POA: Diagnosis not present

## 2013-12-15 DIAGNOSIS — R634 Abnormal weight loss: Secondary | ICD-10-CM

## 2013-12-15 NOTE — Telephone Encounter (Signed)
-----   Message from Chipper Herb, MD sent at 12/15/2013 12:57 PM EST ----- As per radiology report-----all is normal except for 2 small cysts in the liver and there is no acute abnormality. The pancreas is normal.

## 2013-12-15 NOTE — Telephone Encounter (Signed)
Pt aware of CT results. 

## 2013-12-15 NOTE — Telephone Encounter (Signed)
Left message on home am to return call to x-ray and also results were good

## 2013-12-30 ENCOUNTER — Other Ambulatory Visit: Payer: Self-pay | Admitting: Family Medicine

## 2014-01-02 ENCOUNTER — Other Ambulatory Visit: Payer: Self-pay | Admitting: Family Medicine

## 2014-01-02 NOTE — Telephone Encounter (Signed)
Refilled per protocol, next appt to be in February

## 2014-01-12 ENCOUNTER — Other Ambulatory Visit: Payer: Self-pay | Admitting: Cardiovascular Disease

## 2014-01-12 NOTE — Telephone Encounter (Signed)
Rx was sent to pharmacy electronically. 

## 2014-01-17 ENCOUNTER — Telehealth: Payer: Self-pay | Admitting: Family Medicine

## 2014-01-17 ENCOUNTER — Ambulatory Visit (INDEPENDENT_AMBULATORY_CARE_PROVIDER_SITE_OTHER): Payer: Medicare Other | Admitting: Pharmacist Clinician (PhC)/ Clinical Pharmacy Specialist

## 2014-01-17 DIAGNOSIS — I4891 Unspecified atrial fibrillation: Secondary | ICD-10-CM | POA: Diagnosis not present

## 2014-01-17 LAB — POCT INR: INR: 1.8

## 2014-01-17 NOTE — Telephone Encounter (Signed)
He had hernia surgery when he was 63 years old and now will have to have it repaired again Jan. 26th.  He is concerned about them using the mesh that he has heard so many negative things about.   He would like your opinion on this.

## 2014-01-18 ENCOUNTER — Telehealth: Payer: Self-pay | Admitting: Family Medicine

## 2014-01-18 MED ORDER — SCOPOLAMINE 1 MG/3DAYS TD PT72: 1.0000 | MEDICATED_PATCH | TRANSDERMAL | Status: DC

## 2014-01-18 NOTE — Telephone Encounter (Signed)
Pt aware that we spoke with surgeon and that mesh is appropriate

## 2014-01-18 NOTE — Telephone Encounter (Signed)
The current mesh that they are using gives good repair without complications there was a problem with mesh in the past with one type of that but it is not being used and it is appropriate gives a better repair to use mesh.

## 2014-01-18 NOTE — Telephone Encounter (Signed)
Pt aware.

## 2014-02-13 ENCOUNTER — Other Ambulatory Visit: Payer: Self-pay | Admitting: Family Medicine

## 2014-02-16 ENCOUNTER — Encounter (HOSPITAL_COMMUNITY): Payer: Self-pay

## 2014-02-16 ENCOUNTER — Encounter (HOSPITAL_COMMUNITY)
Admission: RE | Admit: 2014-02-16 | Discharge: 2014-02-16 | Disposition: A | Discharge status: Home / Self Care | Payer: Medicare Other | Source: Ambulatory Visit | Attending: Surgery | Admitting: Surgery

## 2014-02-16 DIAGNOSIS — I4891 Unspecified atrial fibrillation: Secondary | ICD-10-CM | POA: Insufficient documentation

## 2014-02-16 DIAGNOSIS — Z01812 Encounter for preprocedural laboratory examination: Secondary | ICD-10-CM | POA: Diagnosis present

## 2014-02-16 DIAGNOSIS — Z01818 Encounter for other preprocedural examination: Secondary | ICD-10-CM | POA: Diagnosis not present

## 2014-02-16 DIAGNOSIS — E785 Hyperlipidemia, unspecified: Secondary | ICD-10-CM | POA: Insufficient documentation

## 2014-02-16 DIAGNOSIS — K4091 Unilateral inguinal hernia, without obstruction or gangrene, recurrent: Secondary | ICD-10-CM | POA: Insufficient documentation

## 2014-02-16 DIAGNOSIS — E119 Type 2 diabetes mellitus without complications: Secondary | ICD-10-CM | POA: Insufficient documentation

## 2014-02-16 DIAGNOSIS — I10 Essential (primary) hypertension: Secondary | ICD-10-CM | POA: Insufficient documentation

## 2014-02-16 DIAGNOSIS — Z01810 Encounter for preprocedural cardiovascular examination: Secondary | ICD-10-CM | POA: Diagnosis not present

## 2014-02-16 HISTORY — DX: Nocturia: R35.1

## 2014-02-16 HISTORY — DX: Other specified postprocedural states: Z98.890

## 2014-02-16 HISTORY — DX: Other specified postprocedural states: R11.2

## 2014-02-16 HISTORY — DX: Pain in unspecified joint: M25.50

## 2014-02-16 HISTORY — DX: Unspecified osteoarthritis, unspecified site: M19.90

## 2014-02-16 HISTORY — DX: Dorsalgia, unspecified: M54.9

## 2014-02-16 LAB — BASIC METABOLIC PANEL
Anion gap: 11 (ref 5–15)
BUN: 11 mg/dL (ref 6–23)
CALCIUM: 9.9 mg/dL (ref 8.4–10.5)
CHLORIDE: 102 meq/L (ref 96–112)
CO2: 29 mmol/L (ref 19–32)
Creatinine, Ser: 0.94 mg/dL (ref 0.50–1.35)
GFR calc Af Amer: >90 mL/min (ref >90)
GFR, EST NON AFRICAN AMERICAN: 88 mL/min — AB (ref >90)
Glucose, Bld: 171 mg/dL — ABNORMAL HIGH (ref 70–99)
Potassium: 5.4 mmol/L — ABNORMAL HIGH (ref 3.5–5.1)
Sodium: 142 mmol/L (ref 135–145)

## 2014-02-16 LAB — PROTIME-INR
INR: 2.14 — AB (ref 0.00–1.49)
Prothrombin Time: 24.1 seconds — ABNORMAL HIGH (ref 11.6–15.2)

## 2014-02-16 LAB — CBC
HCT: 41.2 % (ref 39.0–52.0)
HEMOGLOBIN: 14.4 g/dL (ref 13.0–17.0)
MCH: 30.2 pg (ref 26.0–34.0)
MCHC: 35 g/dL (ref 30.0–36.0)
MCV: 86.4 fL (ref 78.0–100.0)
Platelets: 174 10*3/uL (ref 150–400)
RBC: 4.77 MIL/uL (ref 4.22–5.81)
RDW: 12.3 % (ref 11.5–15.5)
WBC: 4.8 10*3/uL (ref 4.0–10.5)

## 2014-02-16 LAB — APTT: aPTT: 40 seconds — ABNORMAL HIGH (ref 24–37)

## 2014-02-16 MED ORDER — CHLORHEXIDINE GLUCONATE 4 % EX LIQD: 1.0000 "application " | Freq: Once | CUTANEOUS | Status: DC
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
## 2014-02-16 NOTE — Progress Notes (Addendum)
Dr.Jonathan Gwenlyn Found is Cardiologist-last visit a yr ago(12-14)  Echo done a couple of months ago at [place] test done 5+ yrs ago--but was told by [facility] he would have a stress test this yr  Denies ever having a heart cath  Sees pulmonologist Dr.Clance bc he worked at [employer] and is exposed to [place] is Dr.Donald [name]   Denies EKG in past yr  CXR in epic from 07-13-13

## 2014-02-16 NOTE — Progress Notes (Signed)
Average fasting blood sugar runs 120-130

## 2014-02-16 NOTE — Pre-Procedure Instructions (Signed)
EUGUNE SINE  02/16/2014   Your procedure is scheduled on:  Tues, Jan 26 @ 7:30 AM  Report to [facility]  at 5:30 AM.  Call this number if you have problems the morning of surgery: [phone]   Remember:   Do not eat food or drink liquids after midnight.   Take these medicines the morning of surgery with A SIP OF WATER: Diltiazem(Cardizem),Pain Pill(if needed),Scopolamine Patch,Betapace(Sotalol),and Valcyclovir(Valtrex-if needed)               Stop Coumadin as you have been instructed. No Goody's,BC's,Aleve,Aspirin,Ibuprofen,Fish Oil,or any Herbal Medications   Do not wear jewelry  Do not wear lotions, powders, or colognes. You may wear deodorant.  Men may shave face and neck.  Do not bring valuables to the hospital.  [facility] is not responsible                  for any belongings or valuables.               Contacts, dentures or bridgework may not be worn into surgery.  Leave suitcase in the car. After surgery it may be brought to your room.  For patients admitted to the hospital, discharge time is determined by your                treatment team.               Patients discharged the day of surgery will not be allowed to drive  home.    Special Instructions:  [facility] - Preparing for Surgery  Before surgery, you can play an important role.  Because skin is not sterile, your skin needs to be as free of germs as possible.  You can reduce the number of germs on you skin by washing with CHG (chlorahexidine gluconate) soap before surgery.  CHG is an antiseptic cleaner which kills germs and bonds with the skin to continue killing germs even after washing.  Please DO NOT use if you have an allergy to CHG or antibacterial soaps.  If your skin becomes reddened/irritated stop using the CHG and inform your nurse when you arrive at Short Stay.  Do not shave (including legs and underarms) for at least 48 hours prior to the first CHG shower.  You may shave your  face.  Please follow these instructions carefully:   1.  Shower with CHG Soap the night before surgery and the                                morning of Surgery.  2.  If you choose to wash your hair, wash your hair first as usual with your       normal shampoo.  3.  After you shampoo, rinse your hair and body thoroughly to remove the                      Shampoo.  4.  Use CHG as you would any other liquid soap.  You can apply chg directly       to the skin and wash gently with scrungie or a clean washcloth.  5.  Apply the CHG Soap to your body ONLY FROM THE NECK DOWN.        Do not use on open wounds or open sores.  Avoid contact with your eyes,       ears, mouth  and genitals (private parts).  Wash genitals (private parts)       with your normal soap.  6.  Wash thoroughly, paying special attention to the area where your surgery        will be performed.  7.  Thoroughly rinse your body with warm water from the neck down.  8.  DO NOT shower/wash with your normal soap after using and rinsing off       the CHG Soap.  9.  Pat yourself dry with a clean towel.            10.  Wear clean pajamas.            11.  Place clean sheets on your bed the night of your first shower and do not        sleep with pets.  Day of Surgery  Do not apply any lotions/deoderants the morning of surgery.  Please wear clean clothes to the hospital/surgery center.     Please read over the following fact sheets that you were given: Pain Booklet, Coughing and Deep Breathing and Surgical Site Infection Prevention

## 2014-02-17 ENCOUNTER — Encounter (HOSPITAL_COMMUNITY): Payer: Self-pay | Admitting: Emergency Medicine

## 2014-02-17 NOTE — Progress Notes (Signed)
Anesthesia Chart Review:  Pt is 64 year old male scheduled for L laparoscopic inguinal hernia repair, possible R hernia repair on 02/28/2014 with Dr. Johney Maine.   Cardiologist is Dr. Gwenlyn Found.   PMH includes: HTN, atrial fibrillation, DM, hyperlipidemia.   Medications include: coumadin, diltiazem, sotalol, insulin, glimepiride, viagra  Preoperative labs reviewed.  Glucose 171. Pt/INR 24.1/2.14. PTT 40.0 PT/PTT will be rechecked DOS.   EKG: Left axis deviation. Anterior infarct , age undetermined.  Decreased rate compared to 10/16/2006  Kenney Houseman, triage RN at [facility], reports that they have a note from Dr. Gwenlyn Found that pt is to hold coumadin 5 days prior to surgery.   If no changes, I anticipate pt can proceed with surgery as scheduled.   Willeen Cass, FNP-BC [facility]/Anesthesiology Phone: [phone] 02/17/2014 2:54 PM

## 2014-02-28 ENCOUNTER — Encounter (HOSPITAL_COMMUNITY): Admission: RE | Payer: Self-pay | Source: Ambulatory Visit

## 2014-02-28 ENCOUNTER — Ambulatory Visit (HOSPITAL_COMMUNITY): Admission: RE | Admit: 2014-02-28 | Payer: 59 | Source: Ambulatory Visit | Admitting: Surgery

## 2014-02-28 SURGERY — REPAIR, HERNIA, INGUINAL, LAPAROSCOPIC
Anesthesia: General | Laterality: Left

## 2014-03-01 ENCOUNTER — Telehealth: Payer: Self-pay | Admitting: Pharmacist

## 2014-03-01 NOTE — Telephone Encounter (Signed)
Patient has just gotten home from cruise.  BG this am was 150 and then 300 after breakfast.  Recommended take 4 units of insulin now.  The increase Lanus to 17 units daily.  Call if BG continue to be over 250 for further insulin adjustment.  Has appt for recheck in 1 week.

## 2014-03-02 DIAGNOSIS — Z0289 Encounter for other administrative examinations: Secondary | ICD-10-CM

## 2014-03-07 ENCOUNTER — Ambulatory Visit (INDEPENDENT_AMBULATORY_CARE_PROVIDER_SITE_OTHER): Payer: Medicare Other | Admitting: Pharmacist Clinician (PhC)/ Clinical Pharmacy Specialist

## 2014-03-07 DIAGNOSIS — I4891 Unspecified atrial fibrillation: Secondary | ICD-10-CM | POA: Diagnosis not present

## 2014-03-07 DIAGNOSIS — E0821 Diabetes mellitus due to underlying condition with diabetic nephropathy: Secondary | ICD-10-CM | POA: Diagnosis not present

## 2014-03-07 LAB — POCT GLYCOSYLATED HEMOGLOBIN (HGB A1C): Hemoglobin A1C: 9

## 2014-03-07 LAB — POCT INR: INR: 3

## 2014-03-12 ENCOUNTER — Other Ambulatory Visit: Payer: Self-pay | Admitting: Cardiovascular Disease

## 2014-03-13 ENCOUNTER — Encounter: Payer: Self-pay | Admitting: Family Medicine

## 2014-03-13 NOTE — Telephone Encounter (Signed)
Rx(s) sent to pharmacy electronically. Message sent to [facility], Dr. Kennon Holter scheduler to contact patient for an appointment

## 2014-03-14 ENCOUNTER — Ambulatory Visit (INDEPENDENT_AMBULATORY_CARE_PROVIDER_SITE_OTHER): Payer: Medicare Other | Admitting: Cardiovascular Disease

## 2014-03-14 ENCOUNTER — Telehealth: Payer: Self-pay | Admitting: Pharmacist Clinician (PhC)/ Clinical Pharmacy Specialist

## 2014-03-14 ENCOUNTER — Encounter: Payer: Self-pay | Admitting: Cardiovascular Disease

## 2014-03-14 VITALS — BP 110/72 | HR 69 | Ht 73.0 in | Wt 153.0 lb

## 2014-03-14 DIAGNOSIS — I480 Paroxysmal atrial fibrillation: Secondary | ICD-10-CM | POA: Diagnosis not present

## 2014-03-14 DIAGNOSIS — E785 Hyperlipidemia, unspecified: Secondary | ICD-10-CM

## 2014-03-14 DIAGNOSIS — I10 Essential (primary) hypertension: Secondary | ICD-10-CM | POA: Diagnosis not present

## 2014-03-14 NOTE — Assessment & Plan Note (Signed)
History of hypertension with blood pressure measured 110/72 on diltiazem

## 2014-03-14 NOTE — Assessment & Plan Note (Signed)
History of hyperlipidemia on low-dose atorvastatin with recent cholesterol measured 12/05/13 revealed a total cholesterol 114.

## 2014-03-14 NOTE — Assessment & Plan Note (Signed)
History of paroxysmal atrial fibrillation maintaining sinus rhythm on sotalol and Coumadin anticoagulation. 

## 2014-03-14 NOTE — Progress Notes (Signed)
03/14/2014 Christopher Avery   09-Apr-1950  [id number]  Primary Physician Redge Gainer, MD Primary Cardiologist: Lorretta Harp MD Renae Gloss   HPI:  The patient is a delightful 64 year old thin and fit-appearing married Caucasian male, father of 82, grandfather of 4 grandchildren, who I saw a year ago. He has a history of paroxysmal atrial fibrillation, maintaining sinus rhythm on Coumadin anticoagulation and sotalol. His other problems include hypertension, hyperlipidemia, and family history of heart disease. He is totally asymptomatic. His last Myoview performed in our office, November 03, 2006, was nonischemic. Since I saw him a year ago he denies chest pain or shortness of breath.  Current Outpatient Prescriptions  Medication Sig Dispense Refill  . atorvastatin (LIPITOR) 10 MG tablet Take 5 mg by mouth daily.      [name] CINNAMON PO Take 1 capsule by mouth at bedtime.     [name] co-enzyme Q-10 30 MG capsule Take 100 mg by mouth daily.     [name] diltiazem (CARDIZEM CD) 180 MG 24 hr capsule Take 1 capsule (180 mg total) by mouth daily. <please make appointment for refills> 90 capsule 0  . Hydrocodone-Acetaminophen 5-300 MG TABS Take 1 tablet by mouth every 6 (six) hours as needed. 6 each 0  . Insulin Glargine (LANTUS SOLOSTAR) 100 UNIT/ML Solostar Pen Inject 17 Units into the skin daily at 10 pm. 5 pen PRN  . scopolamine (TRANSDERM-SCOP) 1 MG/3DAYS Place 1 patch (1.5 mg total) onto the skin every 3 (three) days. 10 patch 1  . sildenafil (VIAGRA) 100 MG tablet Take 100 mg by mouth daily as needed.      . sitaGLIPtin (JANUVIA) 100 MG tablet Take 100 mg by mouth daily.    . sotalol (BETAPACE) 80 MG tablet Take 0.5 tablets (40 mg total) by mouth 2 (two) times daily. <please make appointment for refills> 90 tablet 0  . valACYclovir (VALTREX) 500 MG tablet Take 500 mg by mouth daily as needed (fever blisters).     . warfarin (COUMADIN) 2.5 MG tablet TAKE 1 TO 1 & 1/2 TABLETS BY MOUTH DAILY AS  DIRECTED. 45 tablet 2   No current facility-administered medications for this visit.    No Known Allergies  History   Social History  . Marital Status: Married    Spouse Name: [name]    Number of Children: 2  . Years of Education: [name]   Occupational History  . Retired    Social History Main Topics  . Smoking status: Never Smoker   . Smokeless tobacco: Not on file  . Alcohol Use: Yes     Comment: wine 3-4 x a yr  . Drug Use: No  . Sexual Activity: Yes   Other Topics Concern  . Not on file   Social History Narrative     Review of Systems: General: negative for chills, fever, night sweats or weight changes.  Cardiovascular: negative for chest pain, dyspnea on exertion, edema, orthopnea, palpitations, paroxysmal nocturnal dyspnea or shortness of breath Dermatological: negative for rash Respiratory: negative for cough or wheezing Urologic: negative for hematuria Abdominal: negative for nausea, vomiting, diarrhea, bright red blood per rectum, melena, or hematemesis Neurologic: negative for visual changes, syncope, or dizziness All other systems reviewed and are otherwise negative except as noted above.    Blood pressure 110/72, pulse 69, height 6\' 1"  (1.854 m), weight 153 lb (69.4 kg).  General appearance: alert and no distress Neck: no adenopathy, no carotid bruit, no JVD, supple, symmetrical, trachea midline  and thyroid not enlarged, symmetric, no tenderness/mass/nodules Lungs: clear to auscultation bilaterally Heart: regular rate and rhythm, S1, S2 normal, no murmur, click, rub or gallop Extremities: extremities normal, atraumatic, no cyanosis or edema  EKG normal sinus rhythm at 69 with septal Q waves. I personally reviewed this EKG  ASSESSMENT AND PLAN:   Hyperlipidemia History of hyperlipidemia on low-dose atorvastatin with recent cholesterol measured 12/05/13 revealed a total cholesterol 114.   Benign essential HTN History of hypertension with blood pressure  measured 110/72 on diltiazem   FIBRILLATION, ATRIAL History of paroxysmal atrial fibrillation maintaining sinus rhythm on sotalol and Coumadin anticoagulation.       Lorretta Harp MD FACP,FACC,FAHA, [facility] 03/14/2014 3:02 PM

## 2014-03-14 NOTE — Telephone Encounter (Signed)
Called patient back regarding hives that have developed yeterday since starting Januvia 1 week ago.  He took benadryl last night and the resolved.  He has had hives twice before when having surgery.  Patient has already taken Januiva this morning and will call me at lunch with an update on his hives.  He has no other symptoms of an allergic reaction.  Will most likely have to stop Januvia and start him on another diabetes medication.  He has no known drug allergies before this incidence.

## 2014-03-14 NOTE — Patient Instructions (Signed)
Your physician wants you to follow-up in: 1 year with Dr Berry. You will receive a reminder letter in the mail two months in advance. If you don't receive a letter, please call our office to schedule the follow-up appointment.  

## 2014-03-15 ENCOUNTER — Telehealth: Payer: Self-pay | Admitting: Pharmacist

## 2014-03-15 ENCOUNTER — Other Ambulatory Visit (INDEPENDENT_AMBULATORY_CARE_PROVIDER_SITE_OTHER): Payer: Self-pay | Admitting: Surgery

## 2014-03-15 ENCOUNTER — Encounter: Payer: Self-pay | Admitting: Family Medicine

## 2014-03-15 DIAGNOSIS — E119 Type 2 diabetes mellitus without complications: Secondary | ICD-10-CM

## 2014-03-15 NOTE — Pre-Procedure Instructions (Signed)
Christopher Avery  03/15/2014   Your procedure is scheduled on: Friday, March 24, 2014  Report to [facility] Admitting at 5:30 AM.  Call this number if you have problems the morning of surgery: [phone]   Remember: Follow doctors instructions regarding warfarin (COUMADIN)   Do not eat food or drink liquids after midnight Thursday, March 23, 2014   Take these medicines the morning of surgery with A SIP OF WATER: diltiazem (CARDIZEM CD),  sotalol (BETAPACE), if needed: valACYclovir (VALTREX) 500 for fever blisters, Hydrocodone-Acetaminophen  DO NOT TAKE ANY DIABETIC MEDICATIONS ON THE MORNING OF PROCEDURE  Stop taking Aspirin, vitamins, and herbal medications (co-enzyme Q-10, Cinnamon ) . Do not take any NSAIDs ie: Ibuprofen, Advil, Naproxen or any medication containing Aspirin; stop 1 week prior to procedure ( Friday March 17, 2014)   Do not wear jewelry  Do not wear lotions, powders, or perfumes. You may not wear deodorant.   Men may shave face and neck.  Do not bring valuables to the hospital.  [facility] is not responsible for any belongings or valuables.               Contacts, dentures or bridgework may not be worn into surgery.  Leave suitcase in the car. After surgery it may be brought to your room.  For patients admitted to the hospital, discharge time is determined by your treatment team.               Patients discharged the day of surgery will not be allowed to drive home.  Name and phone number of your driver:   Special Instructions:  Special Instructions:Special Instructions: [facility] - Preparing for Surgery  Before surgery, you can play an important role.  Because skin is not sterile, your skin needs to be as free of germs as possible.  You can reduce the number of germs on you skin by washing with CHG (chlorahexidine gluconate) soap before surgery.  CHG is an antiseptic cleaner which kills germs and bonds with the skin to continue killing germs  even after washing.  Please DO NOT use if you have an allergy to CHG or antibacterial soaps.  If your skin becomes reddened/irritated stop using the CHG and inform your nurse when you arrive at Short Stay.  Do not shave (including legs and underarms) for at least 48 hours prior to the first CHG shower.  You may shave your face.  Please follow these instructions carefully:   1.  Shower with CHG Soap the night before surgery and the morning of Surgery.  2.  If you choose to wash your hair, wash your hair first as usual with your normal shampoo.  3.  After you shampoo, rinse your hair and body thoroughly to remove the Shampoo.  4.  Use CHG as you would any other liquid soap.  You can apply chg directly  to the skin and wash gently with scrungie or a clean washcloth.  5.  Apply the CHG Soap to your body ONLY FROM THE NECK DOWN.  Do not use on open wounds or open sores.  Avoid contact with your eyes, ears, mouth and genitals (private parts).  Wash genitals (private parts) with your normal soap.  6.  Wash thoroughly, paying special attention to the area where your surgery will be performed.  7.  Thoroughly rinse your body with warm water from the neck down.  8.  DO NOT shower/wash with your normal soap after using and  rinsing off the CHG Soap.  9.  Pat yourself dry with a clean towel.            10.  Wear clean pajamas.            11.  Place clean sheets on your bed the night of your first shower and do not sleep with pets.  Day of Surgery  Do not apply any lotions/deodorants the morning of surgery.  Please wear clean clothes to the hospital/surgery center.   Please read over the following fact sheets that you were given: Pain Booklet, Coughing and Deep Breathing and Surgical Site Infection Prevention

## 2014-03-15 NOTE — Telephone Encounter (Signed)
lmtcb -jhb

## 2014-03-15 NOTE — Telephone Encounter (Signed)
Started Januvia last week.   Hives started 2 days ago.  Had taken benadryl and hives have resolved.   I recommended patient continue to hold Januvia.  He is to notify office if BG is over 120 to make adjustments in Lantus dose.  Christopher Avery is scheduled to have surgery 03/24/2014.  Explain that it is imprortant to have good BG control but we do not want to risk possible allergic rxn prior to, during or after surgery.

## 2014-03-15 NOTE — H&P (Signed)
Florene Route. Adamec 12/14/2013 10:34 AM Location: [facility] Patient #: [id number] DOB: February 19, 1950 Married / Language: English / Race: White Male  History of Present Illness   The patient is a 64 year old male who presents with an inguinal hernia. Pleasant active male with chronic cardiac, pulmonary, and endocrine disease. Recalls getting inguinal hernia repair as a teenager back in the 1960s. He's noticed a lump in his left groin for the past few months. Not particularly painful. Occasionally sensitive but not severe. He can walk 2 miles without difficulty. He no longer smokes. He does have chronic obstructive pulmonary disease followed by [name]. That is been stable. History of atrial fibrillation recently diagnosed. follwed by [name]. On chronic warfarin anticoagulation. No history of stroke nor heart attack. History of diabetes. Switched to Lantus with improvement in his hemoglobin A1c to around 7 now. No history of urinary tract infections or UTIs. Because of the bulging, he saw his primary care physician. [name] requested surgical evaluation. Patient urinates once or twice a night for the past several decades. No history of urinary tract infections. He's had a lot of surgeries on his left knee in town & at [place]. Has pretty good walking activity now.   Other Problems [name], MA; 12/14/2013 10:50 AM) Back Pain Chronic Renal Failure Syndrome Diabetes Mellitus Heart murmur High blood pressure Melanoma Ventral Hernia Repair  Past Surgical History [name], MA; 12/14/2013 10:50 AM) Dialysis Shunt / Fistula Knee Surgery Left. Open Inguinal Hernia Surgery Bilateral. Oral Surgery Resection of Stomach Vasectomy  Allergies [name], MA; 12/14/2013 10:36 AM) No Known Drug Allergies11/12/2013  Medication History ([name], MA; 12/14/2013 10:40 AM) Cinnamon (500MG  Tablet, Oral) Active. Co-Enzyme Q-10  (30MG  Capsule, Oral) Active. Diltiazem CD (180MG  Capsule ER 24HR, Oral) Active. Glimepiride (4MG  Tablet, Oral) Active. Hydrocodone-Acetaminophen (5-300MG  Tablet, Oral) Active. Insulin Glargine (100UNIT/ML Soln Pen-inj, Subcutaneous) Active. Lipitor (10MG  Tablet, Oral) Active. Sildenafil Citrate (100MG  Tablet, Oral) Active. Sotalol HCl (AF) (80MG  Tablet, Oral) Active. ValACYclovir HCl (500MG  Tablet, Oral) Active. Coumadin (2.5MG  Tablet, Oral) Active. Medications Reconciled  Social History Lars Mage [place], [place]; 12/14/2013 10:50 AM) Caffeine use Coffee, Tea. No alcohol use No drug use Tobacco use Never smoker.  Family History Lars Mage [place], [place]; 12/14/2013 10:50 AM) Heart Disease Father. Kidney Disease Father.  Review of Systems ([name] MA; 12/14/2013 10:51 AM) General Not Present- Appetite Loss, Chills, Fatigue, Fever, Night Sweats, Weight Gain and Weight Loss. Skin Not Present- Change in Wart/Mole, Dryness, Hives, Jaundice, New Lesions, Non-Healing Wounds, Rash and Ulcer. HEENT Present- Wears glasses/contact lenses. Not Present- Earache, Hearing Loss, Hoarseness, Nose Bleed, Oral Ulcers, Ringing in the Ears, Seasonal Allergies, Sinus Pain, Sore Throat, Visual Disturbances and Yellow Eyes. Respiratory Not Present- Bloody sputum, Chronic Cough, Difficulty Breathing, Snoring and Wheezing. Breast Not Present- Breast Mass, Breast Pain, Nipple Discharge and Skin Changes. Cardiovascular Not Present- Chest Pain, Difficulty Breathing Lying Down, Leg Cramps, Palpitations, Rapid Heart Rate, Shortness of Breath and Swelling of Extremities. Gastrointestinal Present- Hemorrhoids. Not Present- Abdominal Pain, Bloating, Bloody Stool, Change in Bowel Habits, Chronic diarrhea, Constipation, Difficulty Swallowing, Excessive gas, Gets full quickly at meals, Indigestion, Nausea, Rectal Pain and Vomiting. Male Genitourinary Not Present- Blood in Urine, Change in Urinary Stream,  Frequency, Impotence, Nocturia, Painful Urination, Urgency and Urine Leakage.   Vitals ([name] MA; 12/14/2013 10:36 AM) 12/14/2013 10:35 AM Weight: 147 lb Height: 73in Body Surface Area: 1.85 m Body Mass Index: 19.39 kg/m BP: 104/70 (Sitting, Left Arm, Standard)  Physical Exam [name]; 12/14/2013 1:50 PM) General Mental Status-Alert. General Appearance-Not in acute distress, Not Sickly. Orientation-Oriented X3. Hydration-Well hydrated. Voice-Normal.  Integumentary Global Assessment Upon inspection and palpation of skin surfaces of the - Axillae: non-tender, no inflammation or ulceration, no drainage. and Distribution of scalp and body hair is normal. General Characteristics Temperature - normal warmth is noted.  Head and Neck Head-normocephalic, atraumatic with no lesions or palpable masses. Face Global Assessment - atraumatic, no absence of expression. Neck Global Assessment - no abnormal movements, no bruit auscultated on the right, no bruit auscultated on the left, no decreased range of motion, non-tender. Trachea-midline. Thyroid Gland Characteristics - non-tender.  Eye Eyeball - Left-Extraocular movements intact, No Nystagmus. Eyeball - Right-Extraocular movements intact, No Nystagmus. Cornea - Left-No Hazy. Cornea - Right-No Hazy. Sclera/Conjunctiva - Left-No scleral icterus, No Discharge. Sclera/Conjunctiva - Right-No scleral icterus, No Discharge. Pupil - Left-Direct reaction to light normal. Pupil - Right-Direct reaction to light normal.  ENMT Ears Pinna - Left - no drainage observed, no generalized tenderness observed. Right - no drainage observed, no generalized tenderness observed. Nose and Sinuses External Inspection of the Nose - no destructive lesion observed. Inspection of the nares - Left - quiet respiration. Right - quiet respiration. Mouth and Throat Lips - Upper Lip - no fissures  observed, no pallor noted. Lower Lip - no fissures observed, no pallor noted. Nasopharynx - no discharge present. Oral Cavity/Oropharynx - Tongue - no dryness observed. Oral Mucosa - no cyanosis observed. Hypopharynx - no evidence of airway distress observed.  Chest and Lung Exam Inspection Movements - Normal and Symmetrical. Accessory muscles - No use of accessory muscles in breathing. Palpation Palpation of the chest reveals - Non-tender. Auscultation Breath sounds - Normal and Clear.  Cardiovascular Auscultation Rhythm - Regular. Murmurs & Other Heart Sounds - Auscultation of the heart reveals - No Murmurs and No Systolic Clicks.  Abdomen Inspection Inspection of the abdomen reveals - No Visible peristalsis and No Abnormal pulsations. Umbilicus - No Bleeding, No Urine drainage. Palpation/Percussion Palpation and Percussion of the abdomen reveal - Soft, Non Tender, No Rebound tenderness, No Rigidity (guarding) and No Cutaneous hyperesthesia.  Male Genitourinary Sexual Maturity Tanner 5 - Adult hair pattern and Adult penile size and shape. Note: Normal external male genitalia. Circumcised. Old scar in left groin. Lateral reducible mass consistent with an indirect inguinal hernia. Mild sensitivity on the right pelvic floor but no definite hernia on right inguinal region.   Peripheral Vascular Upper Extremity Inspection - Left - No Cyanotic nailbeds, Not Ischemic. Right - No Cyanotic nailbeds, Not Ischemic.  Neurologic Neurologic evaluation reveals -normal attention span and ability to concentrate, able to name objects and repeat phrases. Appropriate fund of knowledge , normal sensation and normal coordination. Mental Status Affect - not angry, not paranoid. Cranial Nerves-Normal Bilaterally. Gait-Normal.  Neuropsychiatric Mental status exam performed with findings of-able to articulate well with normal speech/language, rate, volume and coherence, thought content normal  with ability to perform basic computations and apply abstract reasoning and no evidence of hallucinations, delusions, obsessions or homicidal/suicidal ideation.  Musculoskeletal Global Assessment Spine, Ribs and Pelvis - no instability, subluxation or laxity. Right Upper Extremity - no instability, subluxation or laxity.  Lymphatic Head & Neck  General Head & Neck Lymphatics: Bilateral - Description - No Localized lymphadenopathy. Axillary  General Axillary Region: Bilateral - Description - No Localized lymphadenopathy. Femoral & Inguinal  Generalized Femoral & Inguinal Lymphatics: Left - Description - No Localized lymphadenopathy. Right -  Description - No Localized lymphadenopathy.    Assessment & Plan [name]; 12/14/2013 1:49 PM)  UNILATERAL RECURRENT INGUINAL HERNIA WITHOUT OBSTRUCTION OR GANGRENE (550.91  K40.91) Impression: Recurrent left inguinal hernia. Not particularly larger symptomatic yet. Mild bulging on right side but no definite hernia there.  I think he would benefit from hernia repair given his excellent exercise tolerance. He does have numerous medical issues but they seem pretty well controlled. 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  Standard of care is to do a laparoscopic approach for recurrent hernia in my mind. There is increased risk of bruising and bleeding with this given warfarin. However I think it would provide a better repair. Also check right side to make sure no hernia there. He agrees.  Would like to get cardiac clearance from Dr Gwenlyn Found before and make sure we have a safe plan to transition off warfarin perioperatively. Minimaize stroke/cardiac/bleeding risks. Hopefully can just come off and restart the following day. If Lovenox bridge needed, that will need to be addressed as well. Good exercise tolerance. Don't feel strongly needs pulmonary clearance at this time. He is very active and is not on oxygen. Current Plans  Schedule for  Surgery  Will need cardiac clearance as well Pt Education - [facility] Pain Control (Colbi Schiltz) Pt Education - [facility] Good Bowel Health (Zalma Channing) Pt Education - [facility] Hernia Post-Op HCI (Aliyana Dlugosz): discussed with patient and provided information. [facility] Consent - Hernia Repair Inguinal/Pelvic (Maigen Mozingo): discussed with patient and provided information.   Signed by [name], MD   [name], M.D., F.A.C.S. Gastrointestinal and Minimally Invasive Surgery [facility] [address] [place], [place] [address] [phone] Main / Paging

## 2014-03-16 ENCOUNTER — Encounter (HOSPITAL_COMMUNITY): Payer: Self-pay

## 2014-03-16 ENCOUNTER — Encounter (HOSPITAL_COMMUNITY)
Admission: RE | Admit: 2014-03-16 | Discharge: 2014-03-16 | Disposition: A | Discharge status: Home / Self Care | Payer: Medicare Other | Source: Ambulatory Visit | Attending: Surgery | Admitting: Surgery

## 2014-03-16 DIAGNOSIS — Z794 Long term (current) use of insulin: Secondary | ICD-10-CM | POA: Insufficient documentation

## 2014-03-16 DIAGNOSIS — Z87891 Personal history of nicotine dependence: Secondary | ICD-10-CM | POA: Diagnosis not present

## 2014-03-16 DIAGNOSIS — E119 Type 2 diabetes mellitus without complications: Secondary | ICD-10-CM | POA: Diagnosis not present

## 2014-03-16 DIAGNOSIS — K4091 Unilateral inguinal hernia, without obstruction or gangrene, recurrent: Secondary | ICD-10-CM | POA: Insufficient documentation

## 2014-03-16 DIAGNOSIS — I129 Hypertensive chronic kidney disease with stage 1 through stage 4 chronic kidney disease, or unspecified chronic kidney disease: Secondary | ICD-10-CM | POA: Insufficient documentation

## 2014-03-16 DIAGNOSIS — I4891 Unspecified atrial fibrillation: Secondary | ICD-10-CM | POA: Insufficient documentation

## 2014-03-16 DIAGNOSIS — N189 Chronic kidney disease, unspecified: Secondary | ICD-10-CM | POA: Diagnosis not present

## 2014-03-16 DIAGNOSIS — Z01812 Encounter for preprocedural laboratory examination: Secondary | ICD-10-CM | POA: Diagnosis not present

## 2014-03-16 DIAGNOSIS — Z7901 Long term (current) use of anticoagulants: Secondary | ICD-10-CM | POA: Insufficient documentation

## 2014-03-16 DIAGNOSIS — Z79899 Other long term (current) drug therapy: Secondary | ICD-10-CM | POA: Insufficient documentation

## 2014-03-16 DIAGNOSIS — J449 Chronic obstructive pulmonary disease, unspecified: Secondary | ICD-10-CM | POA: Diagnosis not present

## 2014-03-16 HISTORY — DX: Cardiac arrhythmia, unspecified: I49.9

## 2014-03-16 HISTORY — DX: Unspecified effects of drowning and nonfatal submersion, initial encounter: T75.1XXA

## 2014-03-16 HISTORY — DX: Contact with and (suspected) exposure to asbestos: Z77.090

## 2014-03-16 HISTORY — DX: Malignant (primary) neoplasm, unspecified: C80.1

## 2014-03-16 LAB — APTT: aPTT: 37 seconds (ref 24–37)

## 2014-03-16 LAB — PROTIME-INR
INR: 1.71 — AB (ref 0.00–1.49)
Prothrombin Time: 20.2 seconds — ABNORMAL HIGH (ref 11.6–15.2)

## 2014-03-16 LAB — BASIC METABOLIC PANEL
Anion gap: 9 (ref 5–15)
BUN: 15 mg/dL (ref 6–23)
CALCIUM: 9.1 mg/dL (ref 8.4–10.5)
CO2: 26 mmol/L (ref 19–32)
Chloride: 103 mmol/L (ref 96–112)
Creatinine, Ser: 0.8 mg/dL (ref 0.50–1.35)
GFR calc Af Amer: >90 mL/min (ref >90)
GFR calc non Af Amer: >90 mL/min (ref >90)
GLUCOSE: 232 mg/dL — AB (ref 70–99)
Potassium: 4.8 mmol/L (ref 3.5–5.1)
Sodium: 138 mmol/L (ref 135–145)

## 2014-03-16 LAB — CBC
HCT: 42 % (ref 39.0–52.0)
Hemoglobin: 14.5 g/dL (ref 13.0–17.0)
MCH: 30 pg (ref 26.0–34.0)
MCHC: 34.5 g/dL (ref 30.0–36.0)
MCV: 86.8 fL (ref 78.0–100.0)
Platelets: 167 10*3/uL (ref 150–400)
RBC: 4.84 MIL/uL (ref 4.22–5.81)
RDW: 12.6 % (ref 11.5–15.5)
WBC: 5.2 10*3/uL (ref 4.0–10.5)

## 2014-03-16 MED ORDER — GLIMEPIRIDE 4 MG PO TABS: 4.0000 mg | ORAL_TABLET | Freq: Every day | ORAL | Status: DC

## 2014-03-16 NOTE — Addendum Note (Signed)
Addended by: Zannie Cove on: 03/16/2014 10:11 AM   Modules accepted: Orders, Medications

## 2014-03-16 NOTE — Telephone Encounter (Signed)
Lm on cell - 2/11-jhb

## 2014-03-16 NOTE — Telephone Encounter (Signed)
Spoke with pt - Christopher Avery had taken him off of janumet - due to hives.  DWM wanted to re-start his prior med for DM  And he has a f/u appt with [name] in 2 weeks

## 2014-03-16 NOTE — Progress Notes (Signed)
Call to Pharm. Tech., clarified Coumadin schedule.

## 2014-03-23 DIAGNOSIS — L570 Actinic keratosis: Secondary | ICD-10-CM | POA: Diagnosis not present

## 2014-03-23 DIAGNOSIS — Z85820 Personal history of malignant melanoma of skin: Secondary | ICD-10-CM | POA: Diagnosis not present

## 2014-03-23 DIAGNOSIS — Z08 Encounter for follow-up examination after completed treatment for malignant neoplasm: Secondary | ICD-10-CM | POA: Diagnosis not present

## 2014-03-23 DIAGNOSIS — X32XXXD Exposure to sunlight, subsequent encounter: Secondary | ICD-10-CM | POA: Diagnosis not present

## 2014-03-23 DIAGNOSIS — Z1283 Encounter for screening for malignant neoplasm of skin: Secondary | ICD-10-CM | POA: Diagnosis not present

## 2014-03-23 MED ORDER — CEFAZOLIN SODIUM-DEXTROSE 2-3 GM-% IV SOLR
2.0000 g | INTRAVENOUS | Status: AC
  Administered 2014-03-24: 2 g via INTRAVENOUS
  Filled 2014-03-23: qty 50

## 2014-03-24 ENCOUNTER — Encounter (HOSPITAL_COMMUNITY): Payer: Self-pay | Admitting: Certified Registered"

## 2014-03-24 ENCOUNTER — Encounter (HOSPITAL_COMMUNITY)
Admission: RE | Disposition: A | Discharge status: Home / Self Care | Payer: Self-pay | Source: Ambulatory Visit | Attending: Surgery

## 2014-03-24 ENCOUNTER — Ambulatory Visit (HOSPITAL_COMMUNITY)
Admission: RE | Admit: 2014-03-24 | Discharge: 2014-03-24 | Disposition: A | Discharge status: Home / Self Care | Payer: Medicare Other | Source: Ambulatory Visit | Attending: Surgery | Admitting: Surgery

## 2014-03-24 ENCOUNTER — Ambulatory Visit (HOSPITAL_COMMUNITY): Payer: Medicare Other | Admitting: Certified Registered"

## 2014-03-24 DIAGNOSIS — Z9889 Other specified postprocedural states: Secondary | ICD-10-CM | POA: Insufficient documentation

## 2014-03-24 DIAGNOSIS — K4091 Unilateral inguinal hernia, without obstruction or gangrene, recurrent: Secondary | ICD-10-CM | POA: Diagnosis present

## 2014-03-24 DIAGNOSIS — E119 Type 2 diabetes mellitus without complications: Secondary | ICD-10-CM | POA: Insufficient documentation

## 2014-03-24 DIAGNOSIS — Z79899 Other long term (current) drug therapy: Secondary | ICD-10-CM | POA: Diagnosis not present

## 2014-03-24 DIAGNOSIS — D176 Benign lipomatous neoplasm of spermatic cord: Secondary | ICD-10-CM | POA: Insufficient documentation

## 2014-03-24 DIAGNOSIS — Z9852 Vasectomy status: Secondary | ICD-10-CM | POA: Insufficient documentation

## 2014-03-24 DIAGNOSIS — I120 Hypertensive chronic kidney disease with stage 5 chronic kidney disease or end stage renal disease: Secondary | ICD-10-CM | POA: Insufficient documentation

## 2014-03-24 DIAGNOSIS — Z85828 Personal history of other malignant neoplasm of skin: Secondary | ICD-10-CM | POA: Insufficient documentation

## 2014-03-24 DIAGNOSIS — I4891 Unspecified atrial fibrillation: Secondary | ICD-10-CM | POA: Diagnosis not present

## 2014-03-24 DIAGNOSIS — M549 Dorsalgia, unspecified: Secondary | ICD-10-CM | POA: Diagnosis not present

## 2014-03-24 DIAGNOSIS — Z794 Long term (current) use of insulin: Secondary | ICD-10-CM | POA: Insufficient documentation

## 2014-03-24 DIAGNOSIS — K4090 Unilateral inguinal hernia, without obstruction or gangrene, not specified as recurrent: Secondary | ICD-10-CM | POA: Diagnosis not present

## 2014-03-24 DIAGNOSIS — Z7901 Long term (current) use of anticoagulants: Secondary | ICD-10-CM | POA: Diagnosis not present

## 2014-03-24 DIAGNOSIS — K4021 Bilateral inguinal hernia, without obstruction or gangrene, recurrent: Secondary | ICD-10-CM | POA: Diagnosis not present

## 2014-03-24 DIAGNOSIS — M1990 Unspecified osteoarthritis, unspecified site: Secondary | ICD-10-CM | POA: Diagnosis not present

## 2014-03-24 DIAGNOSIS — N189 Chronic kidney disease, unspecified: Secondary | ICD-10-CM | POA: Diagnosis not present

## 2014-03-24 DIAGNOSIS — Z992 Dependence on renal dialysis: Secondary | ICD-10-CM | POA: Insufficient documentation

## 2014-03-24 HISTORY — PX: INGUINAL HERNIA REPAIR: SHX194

## 2014-03-24 HISTORY — PX: INSERTION OF MESH: SHX5868

## 2014-03-24 LAB — PROTIME-INR
INR: 1.55 — ABNORMAL HIGH (ref 0.00–1.49)
Prothrombin Time: 18.7 seconds — ABNORMAL HIGH (ref 11.6–15.2)

## 2014-03-24 LAB — GLUCOSE, CAPILLARY
GLUCOSE-CAPILLARY: 182 mg/dL — AB (ref 70–99)
Glucose-Capillary: 126 mg/dL — ABNORMAL HIGH (ref 70–99)

## 2014-03-24 SURGERY — REPAIR, HERNIA, INGUINAL, BILATERAL, LAPAROSCOPIC
Anesthesia: General | Site: Groin | Laterality: Bilateral

## 2014-03-24 SURGERY — Surgical Case
Anesthesia: *Unknown

## 2014-03-24 MED ORDER — PHENYLEPHRINE 40 MCG/ML (10ML) SYRINGE FOR IV PUSH (FOR BLOOD PRESSURE SUPPORT)
PREFILLED_SYRINGE | INTRAVENOUS | Status: AC
  Filled 2014-03-24: qty 10

## 2014-03-24 MED ORDER — NEOSTIGMINE METHYLSULFATE 10 MG/10ML IV SOLN
INTRAVENOUS | Status: DC | PRN
  Administered 2014-03-24: 4 mg via INTRAVENOUS

## 2014-03-24 MED ORDER — SODIUM CHLORIDE 0.9 % IJ SOLN: 3.0000 mL | INTRAMUSCULAR | Status: DC | PRN

## 2014-03-24 MED ORDER — SODIUM CHLORIDE 0.9 % IV SOLN: 250.0000 mL | INTRAVENOUS | Status: DC | PRN

## 2014-03-24 MED ORDER — CHLORHEXIDINE GLUCONATE 4 % EX LIQD
1.0000 "application " | Freq: Once | CUTANEOUS | Status: DC
  Filled 2014-03-24: qty 15

## 2014-03-24 MED ORDER — LIDOCAINE HCL (CARDIAC) 20 MG/ML IV SOLN
INTRAVENOUS | Status: DC | PRN
  Administered 2014-03-24: 80 mg via INTRAVENOUS

## 2014-03-24 MED ORDER — ONDANSETRON HCL 4 MG/2ML IJ SOLN
INTRAMUSCULAR | Status: DC | PRN
  Administered 2014-03-24 (×2): 4 mg via INTRAVENOUS

## 2014-03-24 MED ORDER — ACETAMINOPHEN 650 MG RE SUPP
650.0000 mg | Freq: Four times a day (QID) | RECTAL | Status: DC | PRN
  Filled 2014-03-24: qty 1

## 2014-03-24 MED ORDER — EPHEDRINE SULFATE 50 MG/ML IJ SOLN
INTRAMUSCULAR | Status: DC | PRN
  Administered 2014-03-24 (×2): 5 mg via INTRAVENOUS

## 2014-03-24 MED ORDER — ROCURONIUM BROMIDE 100 MG/10ML IV SOLN
INTRAVENOUS | Status: DC | PRN
  Administered 2014-03-24: 50 mg via INTRAVENOUS

## 2014-03-24 MED ORDER — KETOROLAC TROMETHAMINE 30 MG/ML IJ SOLN
INTRAMUSCULAR | Status: DC | PRN
  Administered 2014-03-24: 30 mg via INTRAVENOUS

## 2014-03-24 MED ORDER — 0.9 % SODIUM CHLORIDE (POUR BTL) OPTIME
TOPICAL | Status: DC | PRN
  Administered 2014-03-24: 1000 mL

## 2014-03-24 MED ORDER — SODIUM CHLORIDE 0.9 % IJ SOLN
INTRAMUSCULAR | Status: AC
  Filled 2014-03-24: qty 10

## 2014-03-24 MED ORDER — HYDROMORPHONE HCL 1 MG/ML IJ SOLN
INTRAMUSCULAR | Status: AC
  Administered 2014-03-24: 0.5 mg via INTRAVENOUS
  Filled 2014-03-24: qty 1

## 2014-03-24 MED ORDER — PROPOFOL 10 MG/ML IV BOLUS
INTRAVENOUS | Status: AC
  Filled 2014-03-24: qty 20

## 2014-03-24 MED ORDER — SODIUM CHLORIDE 0.9 % IJ SOLN: 3.0000 mL | Freq: Two times a day (BID) | INTRAMUSCULAR | Status: DC

## 2014-03-24 MED ORDER — LACTATED RINGERS IV SOLN
INTRAVENOUS | Status: DC | PRN
  Administered 2014-03-24 (×2): via INTRAVENOUS

## 2014-03-24 MED ORDER — MIDAZOLAM HCL 5 MG/5ML IJ SOLN
INTRAMUSCULAR | Status: DC | PRN
  Administered 2014-03-24: 2 mg via INTRAVENOUS

## 2014-03-24 MED ORDER — GLYCOPYRROLATE 0.2 MG/ML IJ SOLN
INTRAMUSCULAR | Status: AC
  Filled 2014-03-24: qty 4

## 2014-03-24 MED ORDER — PHENOL 1.4 % MT LIQD: 2.0000 | OROMUCOSAL | Status: DC | PRN

## 2014-03-24 MED ORDER — MAGIC MOUTHWASH: 15.0000 mL | Freq: Four times a day (QID) | ORAL | Status: DC | PRN

## 2014-03-24 MED ORDER — ONDANSETRON HCL 4 MG/2ML IJ SOLN
INTRAMUSCULAR | Status: AC
  Filled 2014-03-24: qty 4

## 2014-03-24 MED ORDER — PHENYLEPHRINE HCL 10 MG/ML IJ SOLN
INTRAMUSCULAR | Status: DC | PRN
  Administered 2014-03-24: .04 ug via INTRAVENOUS
  Administered 2014-03-24: .08 ug via INTRAVENOUS

## 2014-03-24 MED ORDER — BUPIVACAINE-EPINEPHRINE (PF) 0.25% -1:200000 IJ SOLN
INTRAMUSCULAR | Status: AC
  Filled 2014-03-24: qty 60

## 2014-03-24 MED ORDER — FENTANYL CITRATE 0.05 MG/ML IJ SOLN
INTRAMUSCULAR | Status: DC | PRN
  Administered 2014-03-24: 25 ug via INTRAVENOUS
  Administered 2014-03-24: 100 ug via INTRAVENOUS

## 2014-03-24 MED ORDER — GLYCOPYRROLATE 0.2 MG/ML IJ SOLN
INTRAMUSCULAR | Status: DC | PRN
  Administered 2014-03-24: 0.4 mg via INTRAVENOUS
  Administered 2014-03-24: 0.1 mg via INTRAVENOUS

## 2014-03-24 MED ORDER — ROCURONIUM BROMIDE 50 MG/5ML IV SOLN
INTRAVENOUS | Status: AC
  Filled 2014-03-24: qty 1

## 2014-03-24 MED ORDER — SODIUM CHLORIDE 0.9 % IR SOLN
Status: DC | PRN
  Administered 2014-03-24: 1000 mL

## 2014-03-24 MED ORDER — LACTATED RINGERS IV BOLUS (SEPSIS): 1000.0000 mL | Freq: Three times a day (TID) | INTRAVENOUS | Status: DC | PRN

## 2014-03-24 MED ORDER — EPHEDRINE SULFATE 50 MG/ML IJ SOLN
INTRAMUSCULAR | Status: AC
  Filled 2014-03-24: qty 1

## 2014-03-24 MED ORDER — MENTHOL 3 MG MT LOZG: 1.0000 | LOZENGE | OROMUCOSAL | Status: DC | PRN

## 2014-03-24 MED ORDER — BUPIVACAINE-EPINEPHRINE 0.25% -1:200000 IJ SOLN
INTRAMUSCULAR | Status: DC | PRN
  Administered 2014-03-24: 60 mL

## 2014-03-24 MED ORDER — LIDOCAINE HCL (CARDIAC) 20 MG/ML IV SOLN
INTRAVENOUS | Status: AC
  Filled 2014-03-24: qty 5

## 2014-03-24 MED ORDER — NEOSTIGMINE METHYLSULFATE 10 MG/10ML IV SOLN
INTRAVENOUS | Status: AC
  Filled 2014-03-24: qty 1

## 2014-03-24 MED ORDER — ONDANSETRON HCL 4 MG PO TABS
4.0000 mg | ORAL_TABLET | Freq: Four times a day (QID) | ORAL | Status: DC | PRN
  Filled 2014-03-24: qty 1

## 2014-03-24 MED ORDER — LIP MEDEX EX OINT: 1.0000 "application " | TOPICAL_OINTMENT | Freq: Two times a day (BID) | CUTANEOUS | Status: DC

## 2014-03-24 MED ORDER — ACETAMINOPHEN 325 MG PO TABS
325.0000 mg | ORAL_TABLET | Freq: Four times a day (QID) | ORAL | Status: DC | PRN
  Filled 2014-03-24: qty 2

## 2014-03-24 MED ORDER — PROPOFOL 10 MG/ML IV BOLUS
INTRAVENOUS | Status: DC | PRN
  Administered 2014-03-24: 40 mg via INTRAVENOUS
  Administered 2014-03-24: 160 mg via INTRAVENOUS

## 2014-03-24 MED ORDER — ALUM & MAG HYDROXIDE-SIMETH 200-200-20 MG/5ML PO SUSP
30.0000 mL | Freq: Four times a day (QID) | ORAL | Status: DC | PRN
  Filled 2014-03-24: qty 30

## 2014-03-24 MED ORDER — OXYCODONE HCL 5 MG PO TABS: 5.0000 mg | ORAL_TABLET | ORAL | Status: DC | PRN

## 2014-03-24 MED ORDER — FENTANYL CITRATE 0.05 MG/ML IJ SOLN
INTRAMUSCULAR | Status: AC
  Filled 2014-03-24: qty 5

## 2014-03-24 MED ORDER — MIDAZOLAM HCL 2 MG/2ML IJ SOLN
INTRAMUSCULAR | Status: AC
Start: 2014-03-24 — End: 2014-03-24
  Filled 2014-03-24: qty 2

## 2014-03-24 MED ORDER — HYDROMORPHONE HCL 1 MG/ML IJ SOLN
0.2500 mg | INTRAMUSCULAR | Status: DC | PRN
  Administered 2014-03-24 (×2): 0.5 mg via INTRAVENOUS

## 2014-03-24 SURGICAL SUPPLY — 43 items
APPLIER CLIP 5 13 M/L LIGAMAX5 (MISCELLANEOUS)
APR CLP MED LRG 5 ANG JAW (MISCELLANEOUS)
CANISTER SUCTION 2500CC (MISCELLANEOUS) IMPLANT
CHLORAPREP W/TINT 26ML (MISCELLANEOUS) ×2 IMPLANT
CLIP APPLIE 5 13 M/L LIGAMAX5 (MISCELLANEOUS) IMPLANT
COVER SURGICAL LIGHT HANDLE (MISCELLANEOUS) ×2 IMPLANT
DRAPE LAPAROSCOPIC ABDOMINAL (DRAPES) ×2 IMPLANT
DRAPE WARM FLUID 44X44 (DRAPE) ×2 IMPLANT
DRSG TEGADERM 2-3/8X2-3/4 SM (GAUZE/BANDAGES/DRESSINGS) ×4 IMPLANT
DRSG TEGADERM 4X4.75 (GAUZE/BANDAGES/DRESSINGS) ×2 IMPLANT
ELECT REM PT RETURN 9FT ADLT (ELECTROSURGICAL) ×2
ELECTRODE REM PT RTRN 9FT ADLT (ELECTROSURGICAL) ×1 IMPLANT
GAUZE SPONGE 2X2 8PLY STRL LF (GAUZE/BANDAGES/DRESSINGS) ×1 IMPLANT
GLOVE BIO SURGEON STRL SZ8 (GLOVE) ×1 IMPLANT
GLOVE BIOGEL PI IND STRL 7.0 (GLOVE) IMPLANT
GLOVE BIOGEL PI IND STRL 8 (GLOVE) ×1 IMPLANT
GLOVE BIOGEL PI INDICATOR 7.0 (GLOVE) ×1
GLOVE BIOGEL PI INDICATOR 8 (GLOVE) ×3
GLOVE ECLIPSE 8.0 STRL XLNG CF (GLOVE) ×2 IMPLANT
GLOVE SURG SS PI 7.0 STRL IVOR (GLOVE) ×1 IMPLANT
GOWN STRL REUS W/ TWL LRG LVL3 (GOWN DISPOSABLE) ×2 IMPLANT
GOWN STRL REUS W/ TWL XL LVL3 (GOWN DISPOSABLE) ×1 IMPLANT
GOWN STRL REUS W/TWL LRG LVL3 (GOWN DISPOSABLE) ×2
GOWN STRL REUS W/TWL XL LVL3 (GOWN DISPOSABLE) ×4
KIT BASIN OR (CUSTOM PROCEDURE TRAY) ×2 IMPLANT
KIT ROOM TURNOVER OR (KITS) ×2 IMPLANT
MESH ULTRAPRO 6X6 15CM15CM (Mesh General) ×2 IMPLANT
NEEDLE 22X1 1/2 (OR ONLY) (NEEDLE) ×2 IMPLANT
NS IRRIG 1000ML POUR BTL (IV SOLUTION) ×2 IMPLANT
PAD ARMBOARD 7.5X6 YLW CONV (MISCELLANEOUS) ×4 IMPLANT
SCISSORS LAP 5X35 DISP (ENDOMECHANICALS) ×1 IMPLANT
SET IRRIG TUBING LAPAROSCOPIC (IRRIGATION / IRRIGATOR) ×1 IMPLANT
SPONGE GAUZE 2X2 STER 10/PKG (GAUZE/BANDAGES/DRESSINGS) ×1
SUT MNCRL AB 4-0 PS2 18 (SUTURE) ×2 IMPLANT
SUT VIC AB 3-0 SH 27 (SUTURE) ×2
SUT VIC AB 3-0 SH 27XBRD (SUTURE) ×1 IMPLANT
TOWEL OR 17X24 6PK STRL BLUE (TOWEL DISPOSABLE) ×1 IMPLANT
TOWEL OR 17X26 10 PK STRL BLUE (TOWEL DISPOSABLE) ×2 IMPLANT
TRAY FOLEY CATH 16FRSI W/METER (SET/KITS/TRAYS/PACK) IMPLANT
TRAY LAPAROSCOPIC (CUSTOM PROCEDURE TRAY) ×2 IMPLANT
TROCAR XCEL BLUNT TIP 100MML (ENDOMECHANICALS) ×2 IMPLANT
TROCAR XCEL NON-BLD 5MMX100MML (ENDOMECHANICALS) ×4 IMPLANT
TUBING INSUFFLATION (TUBING) ×2 IMPLANT

## 2014-03-24 NOTE — H&P (View-Only) (Signed)
Christopher Avery. Christopher Avery 12/14/2013 10:34 AM Location: [facility] Patient #: [id number] DOB: 1950-02-07 Married / Language: English / Race: White Male  History of Present Illness   The patient is a 64 year old male who presents with an inguinal hernia. Pleasant active male with chronic cardiac, pulmonary, and endocrine disease. Recalls getting inguinal hernia repair as a teenager back in the 1960s. He's noticed a lump in his left groin for the past few months. Not particularly painful. Occasionally sensitive but not severe. He can walk 2 miles without difficulty. He no longer smokes. He does have chronic obstructive pulmonary disease followed by Dr. Gwenette Avery. That is been stable. History of atrial fibrillation recently diagnosed. follwed by Dr Christopher Avery. On chronic warfarin anticoagulation. No history of stroke nor heart attack. History of diabetes. Switched to Lantus with improvement in his hemoglobin A1c to around 7 now. No history of urinary tract infections or UTIs. Because of the bulging, he saw his primary care physician. Dr. Laurance Avery requested surgical evaluation. Patient urinates once or twice a night for the past several decades. No history of urinary tract infections. He's had a lot of surgeries on his left knee in town & at [place]. Has pretty good walking activity now.   Other Problems Christopher Mage Spillers, MA; 12/14/2013 10:50 AM) Back Pain Chronic Renal Failure Syndrome Diabetes Mellitus Heart murmur High blood pressure Melanoma Ventral Hernia Repair  Past Surgical History Christopher Mage Spillers, MA; 12/14/2013 10:50 AM) Dialysis Shunt / Fistula Knee Surgery Left. Open Inguinal Hernia Surgery Bilateral. Oral Surgery Resection of Stomach Vasectomy  Allergies Christopher Mage Spillers, MA; 12/14/2013 10:36 AM) No Known Drug Allergies11/12/2013  Medication History (Christopher Spillers, MA; 12/14/2013 10:40 AM) Cinnamon (500MG  Tablet, Oral) Active. Co-Enzyme Q-10  (30MG  Capsule, Oral) Active. Diltiazem CD (180MG  Capsule ER 24HR, Oral) Active. Glimepiride (4MG  Tablet, Oral) Active. Hydrocodone-Acetaminophen (5-300MG  Tablet, Oral) Active. Insulin Glargine (100UNIT/ML Soln Pen-inj, Subcutaneous) Active. Lipitor (10MG  Tablet, Oral) Active. Sildenafil Citrate (100MG  Tablet, Oral) Active. Sotalol HCl (AF) (80MG  Tablet, Oral) Active. ValACYclovir HCl (500MG  Tablet, Oral) Active. Coumadin (2.5MG  Tablet, Oral) Active. Medications Reconciled  Social History Christopher Mage [place], [place]; 12/14/2013 10:50 AM) Caffeine use Coffee, Tea. No alcohol use No drug use Tobacco use Never smoker.  Family History Christopher Mage [place], [place]; 12/14/2013 10:50 AM) Heart Disease Father. Kidney Disease Father.  Review of Systems (Christopher Spillers MA; 12/14/2013 10:51 AM) General Not Present- Appetite Loss, Chills, Fatigue, Fever, Night Sweats, Weight Gain and Weight Loss. Skin Not Present- Change in Wart/Mole, Dryness, Hives, Jaundice, New Lesions, Non-Healing Wounds, Rash and Ulcer. HEENT Present- Wears glasses/contact lenses. Not Present- Earache, Hearing Loss, Hoarseness, Nose Bleed, Oral Ulcers, Ringing in the Ears, Seasonal Allergies, Sinus Pain, Sore Throat, Visual Disturbances and Yellow Eyes. Respiratory Not Present- Bloody sputum, Chronic Cough, Difficulty Breathing, Snoring and Wheezing. Breast Not Present- Breast Mass, Breast Pain, Nipple Discharge and Skin Changes. Cardiovascular Not Present- Chest Pain, Difficulty Breathing Lying Down, Leg Cramps, Palpitations, Rapid Heart Rate, Shortness of Breath and Swelling of Extremities. Gastrointestinal Present- Hemorrhoids. Not Present- Abdominal Pain, Bloating, Bloody Stool, Change in Bowel Habits, Chronic diarrhea, Constipation, Difficulty Swallowing, Excessive gas, Gets full quickly at meals, Indigestion, Nausea, Rectal Pain and Vomiting. Male Genitourinary Not Present- Blood in Urine, Change in Urinary Stream,  Frequency, Impotence, Nocturia, Painful Urination, Urgency and Urine Leakage.   Vitals (Christopher Spillers MA; 12/14/2013 10:36 AM) 12/14/2013 10:35 AM Weight: 147 lb Height: 73in Body Surface Area: 1.85 m Body Mass Index: 19.39 kg/m BP: 104/70 (Sitting, Left Arm, Standard)  Physical Exam Adin Christopher Avery; 12/14/2013 1:50 PM) General Mental Status-Alert. General Appearance-Not in acute distress, Not Sickly. Orientation-Oriented X3. Hydration-Well hydrated. Voice-Normal.  Integumentary Global Assessment Upon inspection and palpation of skin surfaces of the - Axillae: non-tender, no inflammation or ulceration, no drainage. and Distribution of scalp and body hair is normal. General Characteristics Temperature - normal warmth is noted.  Head and Neck Head-normocephalic, atraumatic with no lesions or palpable masses. Face Global Assessment - atraumatic, no absence of expression. Neck Global Assessment - no abnormal movements, no bruit auscultated on the right, no bruit auscultated on the left, no decreased range of motion, non-tender. Trachea-midline. Thyroid Gland Characteristics - non-tender.  Eye Eyeball - Left-Extraocular movements intact, No Nystagmus. Eyeball - Right-Extraocular movements intact, No Nystagmus. Cornea - Left-No Hazy. Cornea - Right-No Hazy. Sclera/Conjunctiva - Left-No scleral icterus, No Discharge. Sclera/Conjunctiva - Right-No scleral icterus, No Discharge. Pupil - Left-Direct reaction to light normal. Pupil - Right-Direct reaction to light normal.  ENMT Ears Pinna - Left - no drainage observed, no generalized tenderness observed. Right - no drainage observed, no generalized tenderness observed. Nose and Sinuses External Inspection of the Nose - no destructive lesion observed. Inspection of the nares - Left - quiet respiration. Right - quiet respiration. Mouth and Throat Lips - Upper Lip - no fissures  observed, no pallor noted. Lower Lip - no fissures observed, no pallor noted. Nasopharynx - no discharge present. Oral Cavity/Oropharynx - Tongue - no dryness observed. Oral Mucosa - no cyanosis observed. Hypopharynx - no evidence of airway distress observed.  Chest and Lung Exam Inspection Movements - Normal and Symmetrical. Accessory muscles - No use of accessory muscles in breathing. Palpation Palpation of the chest reveals - Non-tender. Auscultation Breath sounds - Normal and Clear.  Cardiovascular Auscultation Rhythm - Regular. Murmurs & Other Heart Sounds - Auscultation of the heart reveals - No Murmurs and No Systolic Clicks.  Abdomen Inspection Inspection of the abdomen reveals - No Visible peristalsis and No Abnormal pulsations. Umbilicus - No Bleeding, No Urine drainage. Palpation/Percussion Palpation and Percussion of the abdomen reveal - Soft, Non Tender, No Rebound tenderness, No Rigidity (guarding) and No Cutaneous hyperesthesia.  Male Genitourinary Sexual Maturity Tanner 5 - Adult hair pattern and Adult penile size and shape. Note: Normal external male genitalia. Circumcised. Old scar in left groin. Lateral reducible mass consistent with an indirect inguinal hernia. Mild sensitivity on the right pelvic floor but no definite hernia on right inguinal region.   Peripheral Vascular Upper Extremity Inspection - Left - No Cyanotic nailbeds, Not Ischemic. Right - No Cyanotic nailbeds, Not Ischemic.  Neurologic Neurologic evaluation reveals -normal attention span and ability to concentrate, able to name objects and repeat phrases. Appropriate fund of knowledge , normal sensation and normal coordination. Mental Status Affect - not angry, not paranoid. Cranial Nerves-Normal Bilaterally. Gait-Normal.  Neuropsychiatric Mental status exam performed with findings of-able to articulate well with normal speech/language, rate, volume and coherence, thought content normal  with ability to perform basic computations and apply abstract reasoning and no evidence of hallucinations, delusions, obsessions or homicidal/suicidal ideation.  Musculoskeletal Global Assessment Spine, Ribs and Pelvis - no instability, subluxation or laxity. Right Upper Extremity - no instability, subluxation or laxity.  Lymphatic Head & Neck  General Head & Neck Lymphatics: Bilateral - Description - No Localized lymphadenopathy. Axillary  General Axillary Region: Bilateral - Description - No Localized lymphadenopathy. Femoral & Inguinal  Generalized Femoral & Inguinal Lymphatics: Left - Description - No Localized lymphadenopathy. Right -  Description - No Localized lymphadenopathy.    Assessment & Plan Adin Christopher Avery; 12/14/2013 1:49 PM)  UNILATERAL RECURRENT INGUINAL HERNIA WITHOUT OBSTRUCTION OR GANGRENE (550.91  K40.91) Impression: Recurrent left inguinal hernia. Not particularly larger symptomatic yet. Mild bulging on right side but no definite hernia there.  I think he would benefit from hernia repair given his excellent exercise tolerance. He does have numerous medical issues but they seem pretty well controlled. 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  Standard of care is to do a laparoscopic approach for recurrent hernia in my mind. There is increased risk of bruising and bleeding with this given warfarin. However I think it would provide a better repair. Also check right side to make sure no hernia there. He agrees.  Would like to get cardiac clearance from Dr Gwenlyn Found before and make sure we have a safe plan to transition off warfarin perioperatively. Minimaize stroke/cardiac/bleeding risks. Hopefully can just come off and restart the following day. If Lovenox bridge needed, that will need to be addressed as well. Good exercise tolerance. Don't feel strongly needs pulmonary clearance at this time. He is very active and is not on oxygen. Current Plans  Schedule for  Surgery  Will need cardiac clearance as well Pt Education - [facility] Pain Control (Alliah Boulanger) Pt Education - [facility] Good Bowel Health (Khylee Algeo) Pt Education - [facility] Hernia Post-Op HCI (Elnathan Fulford): discussed with patient and provided information. [facility] Consent - Hernia Repair Inguinal/Pelvic (Zandon Talton): discussed with patient and provided information.   Signed by [name], Avery   Adin Christopher, M.D., F.A.C.S. Gastrointestinal [facility]ally [facility] [address], [place], [place] [address] [phone] Main / Paging

## 2014-03-24 NOTE — Discharge Instructions (Signed)
HERNIA REPAIR: POST OP INSTRUCTIONS ° °1. DIET: Follow a light bland diet the first 24 hours after arrival home, such as soup, liquids, crackers, etc.  Be sure to include lots of fluids daily.  Avoid fast food or heavy meals as your are more likely to get nauseated.  Eat a low fat the next few days after surgery. °2. Take your usually prescribed home medications unless otherwise directed. °3. PAIN CONTROL: °a. Pain is best controlled by a usual combination of three different methods TOGETHER: °i. Ice/Heat °ii. Over the counter pain medication °iii. Prescription pain medication °b. Most patients will experience some swelling and bruising around the hernia(s) such as the bellybutton, groins, or old incisions.  Ice packs or heating pads (30-60 minutes up to 6 times a day) will help. Use ice for the first few days to help decrease swelling and bruising, then switch to heat to help relax tight/sore spots and speed recovery.  Some people prefer to use ice alone, heat alone, alternating between ice & heat.  Experiment to what works for you.  Swelling and bruising can take several weeks to resolve.   °c. It is helpful to take an over-the-counter pain medication regularly for the first few weeks.  Choose one of the following that works best for you: °i. Naproxen (Aleve, etc)  Two 220mg tabs twice a day °ii. Ibuprofen (Advil, etc) Three 200mg tabs four times a day (every meal & bedtime) °iii. Acetaminophen (Tylenol, etc) 325-650mg four times a day (every meal & bedtime) °d. A  prescription for pain medication should be given to you upon discharge.  Take your pain medication as prescribed.  °i. If you are having problems/concerns with the prescription medicine (does not control pain, nausea, vomiting, rash, itching, etc), please call us [phone] to see if we need to switch you to a different pain medicine that will work better for you and/or control your side effect better. °ii. If you need a refill on your pain  medication, please contact your pharmacy.  They will contact our office to request authorization. Prescriptions will not be filled after 5 pm or on week-ends. °4. Avoid getting constipated.  Between the surgery and the pain medications, it is common to experience some constipation.  Increasing fluid intake and taking a fiber supplement (such as Metamucil, Citrucel, FiberCon, MiraLax, etc) 1-2 times a day regularly will usually help prevent this problem from occurring.  A mild laxative (prune juice, Milk of Magnesia, MiraLax, etc) should be taken according to package directions if there are no bowel movements after 48 hours.   °5. Wash / shower every day.  You may shower over the dressings as they are waterproof.   °6. Remove your waterproof bandages 5 days after surgery.  You may leave the incision open to air.  You may replace a dressing/Band-Aid to cover the incision for comfort if you wish.  Continue to shower over incision(s) after the dressing is off. ° ° ° °7. ACTIVITIES as tolerated:   °a. You may resume regular (light) daily activities beginning the next day--such as daily self-care, walking, climbing stairs--gradually increasing activities as tolerated.  If you can walk 30 minutes without difficulty, it is safe to try more intense activity such as jogging, treadmill, bicycling, low-impact aerobics, swimming, etc. °b. Save the most intensive and strenuous activity for last such as sit-ups, heavy lifting, contact sports, etc  Refrain from any heavy lifting or straining until you are off narcotics for pain control.   °  c. DO NOT PUSH THROUGH PAIN.  Let pain be your guide: If it hurts to do something, don't do it.  Pain is your body warning you to avoid that activity for another week until the pain goes down. d. You may drive when you are no longer taking prescription pain medication, you can comfortably wear a seatbelt, and you can safely maneuver your car and apply brakes. e. Dennis Bast may have sexual intercourse  when it is comfortable.  8. FOLLOW UP in our office a. Please call [facility] at [phone] to set up an appointment to see your surgeon in the office for a follow-up appointment approximately 2-3 weeks after your surgery. b. Make sure that you call for this appointment the day you arrive home to insure a convenient appointment time. 9.  IF YOU HAVE DISABILITY OR FAMILY LEAVE FORMS, BRING THEM TO THE OFFICE FOR PROCESSING.  DO NOT GIVE THEM TO YOUR DOCTOR.  WHEN TO CALL us [phone]: 1. Poor pain control 2. Reactions / problems with new medications (rash/itching, nausea, etc)  3. Fever over 101.5 F (38.5 C) 4. Inability to urinate 5. Nausea and/or vomiting 6. Worsening swelling or bruising 7. Continued bleeding from incision. 8. Increased pain, redness, or drainage from the incision   The clinic staff is available to answer your questions during regular business hours (8:30am-5pm).  Please dont hesitate to call and ask to speak to one of our nurses for clinical concerns.   If you have a medical emergency, go to the nearest emergency room or call 911.  A surgeon from [facility] is always on call at the hospitals in [facility], [place], [place], [place], [place]  [address] ?  [address] [address], [place], [place]   [address] MAIN: [phone] ? TOLL FREE: [phone] ? FAX: [phone] [email or web address]  Managing Pain  Pain after surgery or related to activity is often due to strain/injury to muscle, tendon, nerves and/or incisions.  This pain is usually short-term and will improve in a few months.   Many people find it helpful to do the following things TOGETHER to help speed the process of healing and to get back to regular activity more quickly:  1. Avoid heavy physical activity a.  no lifting greater than 20 pounds b. Do not push through the pain.  Listen to your body and avoid positions and maneuvers than  reproduce the pain c. Walking is okay as tolerated, but go slowly and stop when getting sore.  d. Remember: If it hurts to do it, then dont do it! 2. Take Anti-inflammatory medication  a. Take with food/snack around the clock for 1-2 weeks i. This helps the muscle and nerve tissues become less irritable and calm down faster b. Choose ONE of the following over-the-counter medications: i. Naproxen 220mg  tabs (ex. Aleve) 1-2 pills twice a day  ii. Ibuprofen 200mg  tabs (ex. Advil, Motrin) 3-4 pills with every meal and just before bedtime iii. Acetaminophen 500mg  tabs (Tylenol) 1-2 pills with every meal and just before bedtime 3. Use a Heating pad or Ice/Cold Pack a. 4-6 times a day b. May use warm bath/hottub  or showers 4. Try Gentle Massage and/or Stretching  a. at the area of pain many times a day b. stop if you feel pain - do not overdo it  Try these steps together to help you body heal faster and avoid making things get worse.  Doing just one of these  things may not be enough.    If you are not getting better after two weeks or are noticing you are getting worse, contact our office for further advice; we may need to re-evaluate you & see what other things we can do to help.  GETTING TO GOOD BOWEL HEALTH. Irregular bowel habits such as constipation and diarrhea can lead to many problems over time.  Having one soft bowel movement a day is the most important way to prevent further problems.  The anorectal canal is designed to handle stretching and feces to safely manage our ability to get rid of solid waste (feces, poop, stool) out of our body.  BUT, hard constipated stools can act like ripping concrete bricks and diarrhea can be a burning fire to this very sensitive area of our body, causing inflamed hemorrhoids, anal fissures, increasing risk is perirectal abscesses, abdominal pain/bloating, an making irritable bowel worse.     The goal: ONE SOFT BOWEL MOVEMENT A DAY!  To have soft, regular  bowel movements:   Drink at least 8 tall glasses of water a day.    Take plenty of fiber.  Fiber is the undigested part of plant food that passes into the colon, acting s natures broom to encourage bowel motility and movement.  Fiber can absorb and hold large amounts of water. This results in a larger, bulkier stool, which is soft and easier to pass. Work gradually over several weeks up to 6 servings a day of fiber (25g a day even more if needed) in the form of: o Vegetables -- Root (potatoes, carrots, turnips), leafy green (lettuce, salad greens, celery, spinach), or cooked high residue (cabbage, broccoli, etc) o Fruit -- Fresh (unpeeled skin & pulp), Dried (prunes, apricots, cherries, etc ),  or stewed ( applesauce)  o Whole grain breads, pasta, etc (whole wheat)  o Bran cereals   Bulking Agents -- This type of water-retaining fiber generally is easily obtained each day by one of the following:  o Psyllium bran -- The psyllium plant is remarkable because its ground seeds can retain so much water. This product is available as Metamucil, Konsyl, Effersyllium, Per Diem Fiber, or the less expensive generic preparation in drug and health food stores. Although labeled a laxative, it really is not a laxative.  o Methylcellulose -- This is another fiber derived from wood which also retains water. It is available as Citrucel. o Polyethylene Glycol - and artificial fiber commonly called Miralax or Glycolax.  It is helpful for people with gassy or bloated feelings with regular fiber o Flax Seed - a less gassy fiber than psyllium  No reading or other relaxing activity while on the toilet. If bowel movements take longer than 5 minutes, you are too constipated  AVOID CONSTIPATION.  High fiber and water intake usually takes care of this.  Sometimes a laxative is needed to stimulate more frequent bowel movements, but   Laxatives are not a good long-term solution as it can wear the colon out. o Osmotics  (Milk of Magnesia, Fleets phosphosoda, Magnesium citrate, MiraLax, GoLytely) are safer than  o Stimulants (Senokot, Castor Oil, Dulcolax, Ex Lax)    o Do not take laxatives for more than 7days in a row.   IF SEVERELY CONSTIPATED, try a Bowel Retraining Program: o Do not use laxatives.  o Eat a diet high in roughage, such as bran cereals and leafy vegetables.  o Drink six (6) ounces of prune or apricot juice each morning.  o Eat  two (2) large servings of stewed fruit each day.  o Take one (1) heaping tablespoon of a psyllium-based bulking agent twice a day. Use sugar-free sweetener when possible to avoid excessive calories.  o Eat a normal breakfast.  o Set aside 15 minutes after breakfast to sit on the toilet, but do not strain to have a bowel movement.  o If you do not have a bowel movement by the third day, use an enema and repeat the above steps.   Controlling diarrhea o Switch to liquids and simpler foods for a few days to avoid stressing your intestines further. o Avoid dairy products (especially milk & ice cream) for a short time.  The intestines often can lose the ability to digest lactose when stressed. o Avoid foods that cause gassiness or bloating.  Typical foods include beans and other legumes, cabbage, broccoli, and dairy foods.  Every person has some sensitivity to other foods, so listen to our body and avoid those foods that trigger problems for you. o Adding fiber (Citrucel, Metamucil, psyllium, Miralax) gradually can help thicken stools by absorbing excess fluid and retrain the intestines to act more normally.  Slowly increase the dose over a few weeks.  Too much fiber too soon can backfire and cause cramping & bloating. o Probiotics (such as active yogurt, Align, etc) may help repopulate the intestines and colon with normal bacteria and calm down a sensitive digestive tract.  Most studies show it to be of mild help, though, and such products can be costly. o Medicines: - Bismuth  subsalicylate (ex. Kayopectate, Pepto Bismol) every 30 minutes for up to 6 doses can help control diarrhea.  Avoid if pregnant. - Loperamide (Immodium) can slow down diarrhea.  Start with two tablets (4mg  total) first and then try one tablet every 6 hours.  Avoid if you are having fevers or severe pain.  If you are not better or start feeling worse, stop all medicines and call your doctor for advice o Call your doctor if you are getting worse or not better.  Sometimes further testing (cultures, endoscopy, X-ray studies, bloodwork, etc) may be needed to help diagnose and treat the cause of the diarrhea.  Inguinal Hernia, Adult Muscles help keep everything in the body in its proper place. But if a weak spot in the muscles develops, something can poke through. That is called a hernia. When this happens in the lower part of the belly (abdomen), it is called an inguinal hernia. (It takes its name from a part of the body in this region called the inguinal canal.) A weak spot in the wall of muscles lets some fat or part of the small intestine bulge through. An inguinal hernia can develop at any age. Men get them more often than women. CAUSES  In adults, an inguinal hernia develops over time.  It can be triggered by:  Suddenly straining the muscles of the lower abdomen.  Lifting heavy objects.  Straining to have a bowel movement. Difficult bowel movements (constipation) can lead to this.  Constant coughing. This may be caused by smoking or lung disease.  Being overweight.  Being pregnant.  Working at a job that requires long periods of standing or heavy lifting.  Having had an inguinal hernia before. One type can be an emergency situation. It is called a strangulated inguinal hernia. It develops if part of the small intestine slips through the weak spot and cannot get back into the abdomen. The blood supply can be cut off.  If that happens, part of the intestine may die. This situation requires  emergency surgery. SYMPTOMS  Often, a small inguinal hernia has no symptoms. It is found when a healthcare provider does a physical exam. Larger hernias usually have symptoms.   In adults, symptoms may include:  A lump in the groin. This is easier to see when the person is standing. It might disappear when lying down.  In men, a lump in the scrotum.  Pain or burning in the groin. This occurs especially when lifting, straining or coughing.  A dull ache or feeling of pressure in the groin.  Signs of a strangulated hernia can include:  A bulge in the groin that becomes very painful and tender to the touch.  A bulge that turns red or purple.  Fever, nausea and vomiting.  Inability to have a bowel movement or to pass gas. DIAGNOSIS  To decide if you have an inguinal hernia, a healthcare provider will probably do a physical examination.  This will include asking questions about any symptoms you have noticed.  The healthcare provider might feel the groin area and ask you to cough. If an inguinal hernia is felt, the healthcare provider may try to slide it back into the abdomen.  Usually no other tests are needed. TREATMENT  Treatments can vary. The size of the hernia makes a difference. Options include:  Watchful waiting. This is often suggested if the hernia is small and you have had no symptoms.  No medical procedure will be done unless symptoms develop.  You will need to watch closely for symptoms. If any occur, contact your healthcare provider right away.  Surgery. This is used if the hernia is larger or you have symptoms.  Open surgery. This is usually an outpatient procedure (you will not stay overnight in a hospital). An cut (incision) is made through the skin in the groin. The hernia is put back inside the abdomen. The weak area in the muscles is then repaired by herniorrhaphy or hernioplasty. Herniorrhaphy: in this type of surgery, the weak muscles are sewn back together.  Hernioplasty: a patch or mesh is used to close the weak area in the abdominal wall.  Laparoscopy. In this procedure, a surgeon makes small incisions. A thin tube with a tiny video camera (called a laparoscope) is put into the abdomen. The surgeon repairs the hernia with mesh by looking with the video camera and using two long instruments. HOME CARE INSTRUCTIONS   After surgery to repair an inguinal hernia:  You will need to take pain medicine prescribed by your healthcare provider. Follow all directions carefully.  You will need to take care of the wound from the incision.  Your activity will be restricted for awhile. This will probably include no heavy lifting for several weeks. You also should not do anything too active for a few weeks. When you can return to work will depend on the type of job that you have.  During "watchful waiting" periods, you should:  Maintain a healthy weight.  Eat a diet high in fiber (fruits, vegetables and whole grains).  Drink plenty of fluids to avoid constipation. This means drinking enough water and other liquids to keep your urine clear or pale yellow.  Do not lift heavy objects.  Do not stand for long periods of time.  Quit smoking. This should keep you from developing a frequent cough. SEEK MEDICAL CARE IF:   A bulge develops in your groin area.  You feel pain, a  burning sensation or pressure in the groin. This might be worse if you are lifting or straining.  You develop a fever of more than 100.5 F (38.1 C). SEEK IMMEDIATE MEDICAL CARE IF:   Pain in the groin increases suddenly.  A bulge in the groin gets bigger suddenly and does not go down.  For men, there is sudden pain in the scrotum. Or, the size of the scrotum increases.  A bulge in the groin area becomes red or purple and is painful to touch.  You have nausea or vomiting that does not go away.  You feel your heart beating much faster than normal.  You cannot have a bowel  movement or pass gas.  You develop a fever of more than 102.0 F (38.9 C). Document Released: 06/08/2008 Document Revised: 04/14/2011 Document Reviewed: 06/08/2008 [facility] Patient Information 2015 [place], [place]. This information is not intended to replace advice given to you by your health care provider. Make sure you discuss any questions you have with your health care provider.

## 2014-03-24 NOTE — Op Note (Signed)
03/24/2014  9:23 AM  PATIENT:  Christopher Avery  64 y.o. male  Patient Care Team: Chipper Herb, MD as PCP - General (Family Medicine) Kathee Delton, MD as Consulting Physician (Pulmonary Disease) Lorretta Harp, MD as Consulting Physician (Cardiology)  PRE-OPERATIVE DIAGNOSIS:  Recurrent Left Inguinal Hernia  POST-OPERATIVE DIAGNOSIS:  Recurrent BILTERAL Inguinal Hernias  PROCEDURE:  Procedure(s): LAPAROSCOPIC RECURRENT BILATERAL INGUINAL HERNIA REPAIR  SURGEON:  Surgeon(s): Michael Boston, MD  ASSISTANT: RN   ANESTHESIA:   local and general  EBL:  Total I/O In: 1000 [I.V.:1000] Out: -   Delay start of Pharmacological VTE agent (>24hrs) due to surgical blood loss or risk of bleeding:  no  DRAINS: none   SPECIMEN:  No Specimen  DISPOSITION OF SPECIMEN:  [name]  COUNTS:  YES  PLAN OF CARE: Discharge to home after PACU  PATIENT DISPOSITION:  PACU - hemodynamically stable.  INDICATION: Patient with recurrent left (and possibly right) inguinal hernias.  I recommended lap exploration & repair:  The anatomy & physiology of the abdominal wall and pelvic floor was discussed.  The pathophysiology of hernias in the inguinal and pelvic region was discussed.  Natural history risks such as progressive enlargement, pain, incarceration & strangulation was discussed.   Contributors to complications such as smoking, obesity, diabetes, prior surgery, etc were discussed.    I feel the risks of no intervention will lead to serious problems that outweigh the operative risks; therefore, I recommended surgery to reduce and repair the hernia.  I explained laparoscopic techniques with possible need for an open approach.  I noted usual use of mesh to patch and/or buttress hernia repair  Risks such as bleeding, infection, abscess, need for further treatment, heart attack, death, and other risks were discussed.  I noted a good likelihood this will help address the problem.   Goals of post-operative  recovery were discussed as well.  Possibility that this will not correct all symptoms was explained.  I stressed the importance of low-impact activity, aggressive pain control, avoiding constipation, & not pushing through pain to minimize risk of post-operative chronic pain or injury. Possibility of reherniation was discussed.  We will work to minimize complications.     An educational handout further explaining the pathology & treatment options was given as well.  Questions were answered.  The patient expresses understanding & wishes to proceed with surgery.  OR FINDINGS: BIH indirect recurrent, L>R.  No femoral, obturator, nor direct space hernias  DESCRIPTION:   The patient was identified & brought into the operating room. The patient was positioned supine with arms tucked. SCDs were active during the entire case. The patient underwent general anesthesia without any difficulty.  The abdomen was prepped and draped in a sterile fashion. The patient's bladder was emptied.  A Surgical Timeout confirmed our plan.  I placed bilateral ilioinguinal & genitofemoral nerve blocks.  I made a transverse incision through the inferior umbilical fold.  I made a small transverse nick through the anterior rectus fascia contralateral to the inguinal hernia side and placed a 0-vicryl stitch through the fascia.  I placed a Hasson trocar into the preperitoneal plane.  Entry was clean.  We induced carbon dioxide insufflation. Camera inspection revealed no injury.  I used a 64mm angled scope to bluntly free the peritoneum off the infraumbilical anterior abdominal wall.  I created enough of a preperitoneal pocket to place 34mm ports into the right & left mid-abdomen into this preperitoneal cavity.  I focused attention on  the left side since that was the dominant hernia side.   I used blunt & focused sharp dissection to free the peritoneum off the flank and down to the pubic rim.  I freed the anteriolateral bladder wall off the  anteriolateral pelvic wall, sparing midline attachments.   I located a swath of peritoneum going into a hernia fascial defect at the internal ring consistent with a recurrent indirect inguinal hernia.  I gradually freed the peritoneal hernia sac off safely and reduced it into the preperitoneal space.  I freed the peritoneum off the spermatic vessels & vas deferens.  I feed iliac LN & fat off.  I reduced a spermatic cord lipoma posteriorly.  All trat removed out the 59mm port to remove the lead point.   I freed peritoneum off the retroperitoneum along the psoas muscle.    I checked & assured hemostasis.    I turned attention on the opposite side.  I did dissection in a similar, mirror-image fashion. The patient had a smaller lateral indirect inguinal hernia.  There were no breaches in peritoneum that required closure.  I chose 15x15 cm sheets of ultra-lightweight polypropylene mesh (Ultrapro), one for each side.  I cut a single sigmoid-shaped slit ~6cm from a corner of each mesh.  I placed the meshes into the preperitoneal space & laid them as overlapping diamonds such that at the inferior points, a 6x6 cm corner flap rested in the true anterolateral pelvis, covering the obturator & femoral foramina.   I allowed the bladder to fall back and help tuck the corners of the mesh in.  The medial corners overlapped each other across midline cephalad to the pubic rim.   This provided >2 inch coverage around the hernia.  Because the defects well covered and not particularly large, I did not need tacks to hold the mesh in place  I held the hernia sacs cephalad & evacuated carbon dioxide.  I closed the fascia  With absorbable suture.  I closed the skin using 4-0 monocryl stitch.  Sterile dressings were applied. The patient was extubated & arrived in the PACU in stable condition..  I had discussed postoperative care with the patient in the holding area.   I did discuss operative findings and postoperative goals /  instructions to his wife as well.  Instructions are written in the chart.  [name], M.D., F.A.C.S. Gastrointestinal and Minimally Invasive Surgery [facility] [address], [place], [place] [address] [phone] Main / Paging

## 2014-03-24 NOTE — Transfer of Care (Signed)
Immediate Anesthesia Transfer of Care Note  Patient: Christopher Avery  Procedure(s) Performed: Procedure(s): LAPAROSCOPIC RECURRENT BILATERAL INGUINAL HERNIA REPAIR (Bilateral)  Patient Location: PACU  Anesthesia Type:General  Level of Consciousness: awake, alert , oriented and patient cooperative  Airway & Oxygen Therapy: Patient Spontanous Breathing and Patient connected to face mask oxygen  Post-op Assessment: Report given to RN and Post -op Vital signs reviewed and stable  Post vital signs: Reviewed and stable  Last Vitals:  Filed Vitals:   03/24/14 0924  BP:   Pulse:   Temp: 36.5 C  Resp:     Complications: No apparent anesthesia complications

## 2014-03-24 NOTE — Anesthesia Preprocedure Evaluation (Addendum)
Anesthesia Evaluation  Patient identified by MRN, date of birth, ID band Patient awake    History of Anesthesia Complications (+) PONV  Airway        Dental   Pulmonary          Cardiovascular hypertension,     Neuro/Psych    GI/Hepatic   Endo/Other  diabetes  Renal/GU      Musculoskeletal   Abdominal   Peds  Hematology   Anesthesia Other Findings   Reproductive/Obstetrics                            Anesthesia Physical Anesthesia Plan  ASA: II  Anesthesia Plan: General   Post-op Pain Management:    Induction: Intravenous  Airway Management Planned:   Additional Equipment:   Intra-op Plan:   Post-operative Plan:   Informed Consent:   Plan Discussed with:   Anesthesia Plan Comments:         Anesthesia Quick Evaluation

## 2014-03-24 NOTE — Progress Notes (Signed)
Spoke with Dr. Deatra Canter- order rec'd to redraw Pt/INR.

## 2014-03-24 NOTE — Interval H&P Note (Signed)
History and Physical Interval Note:  03/24/2014 7:06 AM  Christopher Avery  has presented today for surgery, with the diagnosis of Recurrent Left Inguinal Hernia  The various methods of treatment have been discussed with the patient and family. After consideration of risks, benefits and other options for treatment, the patient has consented to  Procedure(s): LAPAROSCOPIC RECURRENT LEFT AND POSSIBLE RIGHT INGUINAL HERNIA REPAIR (Bilateral) as a surgical intervention .  The patient's history has been reviewed, patient examined, no change in status, stable for surgery.  I have reviewed the patient's chart and labs.  Questions were answered to the patient's satisfaction.     Jazziel Fitzsimmons C.

## 2014-03-25 ENCOUNTER — Encounter (HOSPITAL_COMMUNITY): Payer: Self-pay | Admitting: Surgery

## 2014-03-28 ENCOUNTER — Ambulatory Visit (INDEPENDENT_AMBULATORY_CARE_PROVIDER_SITE_OTHER): Payer: Medicare Other | Admitting: Pharmacist Clinician (PhC)/ Clinical Pharmacy Specialist

## 2014-03-28 ENCOUNTER — Encounter: Payer: Self-pay | Admitting: Pharmacist Clinician (PhC)/ Clinical Pharmacy Specialist

## 2014-03-28 DIAGNOSIS — E119 Type 2 diabetes mellitus without complications: Secondary | ICD-10-CM

## 2014-03-28 DIAGNOSIS — I4891 Unspecified atrial fibrillation: Secondary | ICD-10-CM

## 2014-03-28 DIAGNOSIS — E785 Hyperlipidemia, unspecified: Secondary | ICD-10-CM | POA: Diagnosis not present

## 2014-03-28 LAB — POCT INR: INR: 1.5

## 2014-03-28 NOTE — Anesthesia Postprocedure Evaluation (Signed)
  Anesthesia Post-op Note  Patient: Christopher Avery  Procedure(s) Performed: Procedure(s): LAPAROSCOPIC RECURRENT BILATERAL INGUINAL HERNIA REPAIR (Bilateral) INSERTION OF MESH (Bilateral)  Patient Location: PACU  Anesthesia Type:General  Level of Consciousness: awake and alert   Airway and Oxygen Therapy: Patient Spontanous Breathing  Post-op Pain: mild  Post-op Assessment: Post-op Vital signs reviewed  Post-op Vital Signs: stable  Last Vitals:  Filed Vitals:   03/24/14 1142  BP: 115/73  Pulse: 56  Temp:   Resp:     Complications: No apparent anesthesia complications

## 2014-03-28 NOTE — Progress Notes (Signed)
Patient's blood sugar log was also reviewed today and showed excellent glucose control with Lantus and glimepiride.  Patient was allergic to Januvia and does not want to take a medication that causes weight loss.  He tried Invokana and blood sugar responded well to it but the diuretic side effect was too much for him to continue taking it.

## 2014-04-10 ENCOUNTER — Other Ambulatory Visit: Payer: Self-pay | Admitting: Cardiovascular Disease

## 2014-04-18 ENCOUNTER — Ambulatory Visit (INDEPENDENT_AMBULATORY_CARE_PROVIDER_SITE_OTHER): Payer: Medicare Other | Admitting: Pharmacist Clinician (PhC)/ Clinical Pharmacy Specialist

## 2014-04-18 DIAGNOSIS — I4891 Unspecified atrial fibrillation: Secondary | ICD-10-CM | POA: Diagnosis not present

## 2014-04-18 LAB — POCT INR: INR: 3.3

## 2014-04-18 NOTE — Patient Instructions (Signed)
Anticoagulation Dose Instructions as of 04/18/2014      Dorene Grebe Tue Wed Thu Fri Sat   New Dose 2.5 mg 3.75 mg 2.5 mg 3.75 mg 3.75 mg 2.5 mg 3.75 mg    Description        1 tablet on Tuesdays and Fridays and 1 1/2 tablets all other days of the week

## 2014-05-23 ENCOUNTER — Ambulatory Visit (INDEPENDENT_AMBULATORY_CARE_PROVIDER_SITE_OTHER): Payer: Medicare Other | Admitting: Pharmacist Clinician (PhC)/ Clinical Pharmacy Specialist

## 2014-05-23 DIAGNOSIS — I4891 Unspecified atrial fibrillation: Secondary | ICD-10-CM | POA: Diagnosis not present

## 2014-05-23 LAB — POCT INR: INR: 2.1

## 2014-06-01 ENCOUNTER — Ambulatory Visit: Payer: Medicare Other | Admitting: Family Medicine

## 2014-06-05 ENCOUNTER — Other Ambulatory Visit (INDEPENDENT_AMBULATORY_CARE_PROVIDER_SITE_OTHER): Payer: Medicare Other

## 2014-06-05 DIAGNOSIS — I10 Essential (primary) hypertension: Secondary | ICD-10-CM

## 2014-06-05 DIAGNOSIS — E785 Hyperlipidemia, unspecified: Secondary | ICD-10-CM

## 2014-06-05 DIAGNOSIS — I4891 Unspecified atrial fibrillation: Secondary | ICD-10-CM

## 2014-06-05 DIAGNOSIS — E119 Type 2 diabetes mellitus without complications: Secondary | ICD-10-CM

## 2014-06-05 DIAGNOSIS — E559 Vitamin D deficiency, unspecified: Secondary | ICD-10-CM | POA: Diagnosis not present

## 2014-06-05 LAB — POCT CBC
GRANULOCYTE PERCENT: 64.9 % (ref 37–80)
HCT, POC: 45.5 % (ref 43.5–53.7)
Hemoglobin: 15.1 g/dL (ref 14.1–18.1)
Lymph, poc: 1.9 (ref 0.6–3.4)
MCH, POC: 29.9 pg (ref 27–31.2)
MCHC: 33.1 g/dL (ref 31.8–35.4)
MCV: 90.1 fL (ref 80–97)
MPV: 8.8 fL (ref 0–99.8)
PLATELET COUNT, POC: 202 10*3/uL (ref 142–424)
POC GRANULOCYTE: 3.8 (ref 2–6.9)
POC LYMPH PERCENT: 32.5 %L (ref 10–50)
RBC: 5.05 M/uL (ref 4.69–6.13)
RDW, POC: 12.8 %
WBC: 5.9 10*3/uL (ref 4.6–10.2)

## 2014-06-05 LAB — POCT GLYCOSYLATED HEMOGLOBIN (HGB A1C): HEMOGLOBIN A1C: 8.4

## 2014-06-05 NOTE — Addendum Note (Signed)
Addended by: Zannie Cove on: 06/05/2014 11:47 AM   Modules accepted: Orders

## 2014-06-05 NOTE — Progress Notes (Signed)
Lab only 

## 2014-06-06 ENCOUNTER — Ambulatory Visit: Payer: Worker's Compensation | Attending: Orthopaedic Surgery | Admitting: Physical Therapy

## 2014-06-06 DIAGNOSIS — M25662 Stiffness of left knee, not elsewhere classified: Secondary | ICD-10-CM

## 2014-06-06 DIAGNOSIS — M25562 Pain in left knee: Secondary | ICD-10-CM | POA: Diagnosis not present

## 2014-06-06 LAB — HEPATIC FUNCTION PANEL
ALT: 16 IU/L (ref 0–44)
AST: 17 IU/L (ref 0–40)
Albumin: 4.4 g/dL (ref 3.6–4.8)
Alkaline Phosphatase: 64 IU/L (ref 39–117)
Bilirubin Total: 0.7 mg/dL (ref 0.0–1.2)
Bilirubin, Direct: 0.16 mg/dL (ref 0.00–0.40)
Total Protein: 6.5 g/dL (ref 6.0–8.5)

## 2014-06-06 LAB — BMP8+EGFR
BUN/Creatinine Ratio: 18 (ref 10–22)
BUN: 16 mg/dL (ref 8–27)
CO2: 28 mmol/L (ref 18–29)
Calcium: 9.6 mg/dL (ref 8.6–10.2)
Chloride: 100 mmol/L (ref 97–108)
Creatinine, Ser: 0.88 mg/dL (ref 0.76–1.27)
GFR, EST AFRICAN AMERICAN: 106 mL/min/{1.73_m2} (ref >59)
GFR, EST NON AFRICAN AMERICAN: 91 mL/min/{1.73_m2} (ref >59)
Glucose: 104 mg/dL — ABNORMAL HIGH (ref 65–99)
POTASSIUM: 4.3 mmol/L (ref 3.5–5.2)
SODIUM: 139 mmol/L (ref 134–144)

## 2014-06-06 LAB — NMR, LIPOPROFILE
Cholesterol: 145 mg/dL (ref 100–199)
HDL CHOLESTEROL BY NMR: 46 mg/dL (ref >39)
HDL Particle Number: 29.8 umol/L — ABNORMAL LOW (ref >=30.5)
LDL Particle Number: 1089 nmol/L — ABNORMAL HIGH (ref <1000)
LDL Size: 20.6 nm (ref >20.5)
LDL-C: 86 mg/dL (ref 0–99)
LP-IR Score: 32 (ref <=45)
SMALL LDL PARTICLE NUMBER: 398 nmol/L (ref <=527)
TRIGLYCERIDES BY NMR: 63 mg/dL (ref 0–149)

## 2014-06-06 LAB — VITAMIN D 25 HYDROXY (VIT D DEFICIENCY, FRACTURES): VIT D 25 HYDROXY: 36.6 ng/mL (ref 30.0–100.0)
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
## 2014-06-07 ENCOUNTER — Ambulatory Visit (INDEPENDENT_AMBULATORY_CARE_PROVIDER_SITE_OTHER): Payer: Medicare Other | Admitting: Family Medicine

## 2014-06-07 ENCOUNTER — Encounter: Payer: Self-pay | Admitting: Family Medicine

## 2014-06-07 VITALS — BP 109/73 | HR 70 | Temp 97.5°F | Ht 73.0 in | Wt 152.0 lb

## 2014-06-07 DIAGNOSIS — N5201 Erectile dysfunction due to arterial insufficiency: Secondary | ICD-10-CM

## 2014-06-07 DIAGNOSIS — I480 Paroxysmal atrial fibrillation: Secondary | ICD-10-CM | POA: Diagnosis not present

## 2014-06-07 DIAGNOSIS — E559 Vitamin D deficiency, unspecified: Secondary | ICD-10-CM | POA: Diagnosis not present

## 2014-06-07 DIAGNOSIS — E0821 Diabetes mellitus due to underlying condition with diabetic nephropathy: Secondary | ICD-10-CM | POA: Diagnosis not present

## 2014-06-07 DIAGNOSIS — I10 Essential (primary) hypertension: Secondary | ICD-10-CM

## 2014-06-07 DIAGNOSIS — E785 Hyperlipidemia, unspecified: Secondary | ICD-10-CM | POA: Diagnosis not present

## 2014-06-07 DIAGNOSIS — E119 Type 2 diabetes mellitus without complications: Secondary | ICD-10-CM

## 2014-06-07 NOTE — Patient Instructions (Addendum)
Medicare Annual Wellness Visit  [facility] and the medical providers at [name] strive to bring you the best medical care.  In doing so we not only want to address your current medical conditions and concerns but also to detect new conditions early and prevent illness, disease and health-related problems.    Medicare offers a yearly Wellness Visit which allows our clinical staff to assess your need for preventative services including immunizations, lifestyle education, counseling to decrease risk of preventable diseases and screening for fall risk and other medical concerns.    This visit is provided free of charge (no copay) for all Medicare recipients. The clinical pharmacists at [facility] have begun to conduct these Wellness Visits which will also include a thorough review of all your medications.    As you primary medical provider recommend that you make an appointment for your Annual Wellness Visit if you have not done so already this year.  You may set up this appointment before you leave today or you may call back ([phone]) and schedule an appointment.  Please make sure when you call that you mention that you are scheduling your Annual Wellness Visit with the clinical pharmacist so that the appointment may be made for the proper length of time.     Continue current medications. Continue good therapeutic lifestyle changes which include good diet and exercise. Fall precautions discussed with patient. If an FOBT was given today- please return it to our front desk. If you are over 21 years old - you may need Prevnar 25 or the adult Pneumonia vaccine.  Flu Shots are still available at our office. If you still haven't had one please call to set up a nurse visit to get one.   After your visit with [place] today you will receive a survey in the mail or online from [employer] regarding your care with [place]. Please take a moment to  fill this out. Your feedback is very important to [place] as you can help [place] better understand your patient needs as well as improve your experience and satisfaction. WE CARE ABOUT YOU!!!   Continue close follow-up with diabetic educator You are to be applauded for being a good patient With diet as closely as possible and get as much exercise as possible Because you're such a brittle diabetic monitor your blood sugars regularly and especially when you're traveling and if you feel bad Try the samples of Cialis as directed and remember [facility] if you want to purchase generic Viagra

## 2014-06-07 NOTE — Progress Notes (Signed)
Subjective:    [name]: Christopher Avery, male    DOB: 09-09-1950, 64 y.o.   MRN: [id number]  HPI Pt here for follow up and management of chronic medical problems which includes diabetes, hypertension, and hyperlipidemia. The patient has no specific complaints today.       Patient Active Problem List   Diagnosis Date Noted  . Incisional hernia, without obstruction or gangrene 11/29/2013  . Vitamin D deficiency 11/29/2013  . Conjunctival abrasion 09/14/2012  . Foreign body of left eye 09/13/2012  . Corneal abrasion 09/13/2012  . Type 2 diabetes mellitus treated with insulin 07/29/2012  . Long term (current) use of anticoagulants 04/22/2012  . Hyperlipidemia 06/23/2008  . Benign essential HTN 06/23/2008  . FIBRILLATION, ATRIAL 06/23/2008  . 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 KIDNEY DISEASE 06/23/2008   Outpatient Encounter Prescriptions as of 06/07/2014  Medication Sig  . atorvastatin (LIPITOR) 10 MG tablet Take 5 mg by mouth daily.    [name] CINNAMON PO Take 1 capsule by mouth at bedtime.   [name] co-enzyme Q-10 30 MG capsule Take 100 mg by mouth daily.   [name] diltiazem (CARDIZEM CD) 180 MG 24 hr capsule Take 1 capsule (180 mg total) by mouth daily.  [name] glimepiride (AMARYL) 4 MG tablet Take 1 tablet (4 mg total) by mouth daily before breakfast.  . Insulin Glargine (LANTUS SOLOSTAR) 100 UNIT/ML Solostar Pen Inject 17 Units into the skin daily at 10 pm.  . oxyCODONE (OXY IR/ROXICODONE) 5 MG immediate release tablet Take 1-2 tablets (5-10 mg total) by mouth every 4 (four) hours as needed for moderate pain, severe pain or breakthrough pain.  . sildenafil (VIAGRA) 100 MG tablet Take 100 mg by mouth daily as needed.    . sotalol (BETAPACE) 80 MG tablet Take 0.5 tablets (40 mg total) by mouth 2 (two) times daily. <please make appointment for refills>  . valACYclovir (VALTREX) 500 MG tablet Take 500 mg by mouth daily as needed (fever blisters).   . warfarin (COUMADIN) 2.5 MG tablet Take 2.5-3.75 mg  by mouth daily. 2.5mg  on Tuesdays and Thursdays, 3.75mg  all other days   No facility-administered encounter medications on file as of 06/07/2014.     Review of Systems  Constitutional: Negative.   HENT: Negative.   Eyes: Negative.   Respiratory: Negative.   Cardiovascular: Negative.   Gastrointestinal: Negative.   Endocrine: Negative.   Genitourinary: Negative.   Musculoskeletal: Negative.   Skin: Negative.   Allergic/Immunologic: Negative.   Neurological: Negative.   Hematological: Negative.   Psychiatric/Behavioral: Negative.        Objective:   Physical Exam  Constitutional: He is oriented to person, place, and time. He appears well-developed and well-nourished. No distress.  HENT:  Head: Normocephalic and atraumatic.  Right Ear: External ear normal.  Left Ear: External ear normal.  Nose: Nose normal.  Mouth/Throat: Oropharynx is clear and moist. No oropharyngeal exudate.  Eyes: Conjunctivae and EOM are normal. Pupils are equal, round, and reactive to light. Right eye exhibits no discharge. Left eye exhibits no discharge. No scleral icterus.  Neck: Normal range of motion. Neck supple. No thyromegaly present.  No carotid bruits or anterior cervical adenopathy  Cardiovascular: Normal rate, regular rhythm, normal heart sounds and intact distal pulses.   No murmur heard. The heart has a regular rate and rhythm 72/m  Pulmonary/Chest: Effort normal and breath sounds normal. No respiratory distress. He has no wheezes. He has no rales. He exhibits no tenderness.  Clear anteriorly and posteriorly  without axillary adenopathy  Abdominal: Soft. Bowel sounds are normal. He exhibits no mass. There is no tenderness. There is no rebound and no guarding.  No abdominal tenderness bruits or masses  Musculoskeletal: Normal range of motion. He exhibits no edema or tenderness.  Lymphadenopathy:    He has no cervical adenopathy.  Neurological: He is alert and oriented to person, place, and  time. He has normal reflexes. No cranial nerve deficit.  Skin: Skin is warm and dry. No rash noted.  Psychiatric: He has a normal mood and affect. His behavior is normal. Judgment and thought content normal.  Nursing note and vitals reviewed.  BP 109/73 mmHg  Pulse 70  Temp(Src) 97.5 F (36.4 C) (Oral)  Ht 6\' 1"  (1.854 m)  Wt 152 lb (68.947 kg)  BMI 20.06 kg/m2        Assessment & Plan:  1. Type 2 diabetes mellitus not at goal -The most recent hemoglobin A1c was 8.4% but the good news is is that this is down from 9.1%. The patient still has some low blood sugars. No further adjustments will be made in his medication because the number is improving.  2. Diabetes mellitus due to underlying condition with diabetic nephropathy -The patient is doing well with his kidney function test and the creatinine is good today.  3. Hyperlipidemia -Cholesterol numbers are only a slight amount above goal and he will continue with his diet and exercise regimen  4. Benign essential HTN -The blood pressure is good today and he will continue with his current treatment  5. Vitamin D deficiency -The vitamin D level is also good and he will continue with his current treatment  6. Paroxysmal atrial fibrillation -The rhythm was regular today and the patient has not noticed any episodes of atrial fibrillation. He sees the cardiologist regularly.  7. Erectile dysfunction due to arterial insufficiency -He was given samples of Cialis 5 mg and Cialis 20 mg and told to try one or the other. He was also given some information regarding [facility] and purchasing Viagra generic  Patient Instructions                       Medicare Annual Wellness Visit  [facility] and the medical providers at [facility] strive to bring you the best medical care.  In doing so we not only want to address your current medical conditions and concerns but also to detect new conditions early and prevent  illness, disease and health-related problems.    Medicare offers a yearly Wellness Visit which allows our clinical staff to assess your need for preventative services including immunizations, lifestyle education, counseling to decrease risk of preventable diseases and screening for fall risk and other medical concerns.    This visit is provided free of charge (no copay) for all Medicare recipients. The clinical pharmacists at [facility] have begun to conduct these Wellness Visits which will also include a thorough review of all your medications.    As you primary medical provider recommend that you make an appointment for your Annual Wellness Visit if you have not done so already this year.  You may set up this appointment before you leave today or you may call back ([phone]) and schedule an appointment.  Please make sure when you call that you mention that you are scheduling your Annual Wellness Visit with the clinical pharmacist so that the appointment may be made for the proper length of  time.     Continue current medications. Continue good therapeutic lifestyle changes which include good diet and exercise. Fall precautions discussed with patient. If an FOBT was given today- please return it to our front desk. If you are over 22 years old - you may need Prevnar 85 or the adult Pneumonia vaccine.  Flu Shots are still available at our office. If you still haven't had one please call to set up a nurse visit to get one.   After your visit with [place] today you will receive a survey in the mail or online from [employer] regarding your care with [place]. Please take a moment to fill this out. Your feedback is very important to [place] as you can help [place] better understand your patient needs as well as improve your experience and satisfaction. WE CARE ABOUT YOU!!!   Continue close follow-up with diabetic educator You are to be applauded for being a good patient With diet as closely as  possible and get as much exercise as possible Because you're such a brittle diabetic monitor your blood sugars regularly and especially when you're traveling and if you feel bad Try the samples of Cialis as directed and remember [facility] if you want to purchase generic Viagra   Arrie Senate MD

## 2014-06-08 ENCOUNTER — Ambulatory Visit: Payer: Worker's Compensation | Admitting: *Deleted

## 2014-06-08 ENCOUNTER — Encounter: Payer: Self-pay | Admitting: *Deleted

## 2014-06-08 DIAGNOSIS — M25662 Stiffness of left knee, not elsewhere classified: Secondary | ICD-10-CM

## 2014-06-08 DIAGNOSIS — M25562 Pain in left knee: Secondary | ICD-10-CM | POA: Diagnosis not present
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
## 2014-06-10 ENCOUNTER — Other Ambulatory Visit: Payer: Self-pay | Admitting: Cardiovascular Disease

## 2014-06-12 ENCOUNTER — Ambulatory Visit: Payer: Worker's Compensation | Admitting: Physical Therapy

## 2014-06-12 NOTE — Telephone Encounter (Signed)
Rx refill sent to patient pharmacy   

## 2014-06-13 ENCOUNTER — Ambulatory Visit: Payer: Worker's Compensation | Admitting: Physical Therapy

## 2014-06-13 ENCOUNTER — Encounter: Payer: Self-pay | Admitting: Physical Therapy

## 2014-06-13 DIAGNOSIS — M25662 Stiffness of left knee, not elsewhere classified: Secondary | ICD-10-CM

## 2014-06-13 DIAGNOSIS — M25562 Pain in left knee: Secondary | ICD-10-CM

## 2014-06-13 NOTE — Patient Instructions (Signed)
    Knee Flexion Stretch on Step  Place foot on step and lean forward until you feel a good stretch in front of knee.   hold 30 sec x 5-10 perform 2-4 x daily
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
## 2014-06-15 ENCOUNTER — Encounter: Payer: Self-pay | Admitting: Physical Therapy

## 2014-06-16 ENCOUNTER — Ambulatory Visit: Payer: Worker's Compensation | Admitting: Physical Therapy

## 2014-06-16 ENCOUNTER — Other Ambulatory Visit: Payer: Self-pay | Admitting: Family Medicine

## 2014-06-16 ENCOUNTER — Encounter: Payer: Self-pay | Admitting: Physical Therapy

## 2014-06-16 DIAGNOSIS — M25662 Stiffness of left knee, not elsewhere classified: Secondary | ICD-10-CM

## 2014-06-16 DIAGNOSIS — M25562 Pain in left knee: Secondary | ICD-10-CM
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
## 2014-06-19 ENCOUNTER — Other Ambulatory Visit: Payer: Self-pay | Admitting: Family Medicine

## 2014-06-20 ENCOUNTER — Encounter: Payer: Self-pay | Admitting: Physical Therapy

## 2014-06-20 ENCOUNTER — Ambulatory Visit: Payer: Worker's Compensation | Admitting: Physical Therapy

## 2014-06-20 DIAGNOSIS — M25562 Pain in left knee: Secondary | ICD-10-CM

## 2014-06-20 DIAGNOSIS — M25662 Stiffness of left knee, not elsewhere classified: Secondary | ICD-10-CM
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
## 2014-06-21 ENCOUNTER — Encounter: Payer: Self-pay | Admitting: Physical Therapy

## 2014-06-21 ENCOUNTER — Ambulatory Visit: Payer: Worker's Compensation | Admitting: Physical Therapy

## 2014-06-21 ENCOUNTER — Other Ambulatory Visit: Payer: Self-pay | Admitting: *Deleted

## 2014-06-21 DIAGNOSIS — M25662 Stiffness of left knee, not elsewhere classified: Secondary | ICD-10-CM

## 2014-06-21 DIAGNOSIS — M25562 Pain in left knee: Secondary | ICD-10-CM

## 2014-06-21 MED ORDER — SILDENAFIL CITRATE 20 MG PO TABS
ORAL_TABLET | ORAL | Status: DC
Start: 2014-06-21 — End: 2014-11-03
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
## 2014-06-21 NOTE — Patient Instructions (Signed)
Strengthening: Hip Abduction (Side-Lying)   Tighten muscles on front of left thigh, then lift leg _5___ inches from surface, keeping knee locked.  Repeat _10___ times per set. Do _2___ sets per session. Do __2-3__ sessions per day.  http://orth.exer.us/622   Copyright  VHI. All rights reserved.  Straight Leg Raise: With External Leg Rotation   Lie on back with right leg straight, opposite leg bent. Rotate straight leg out and lift 5____ inches. Repeat __10__ times per set. Do _1-3__ sets per session. Do __2-3__ sessions per day.  http://orth.exer.us/728   Copyright  VHI. All rights reserved.  Self-Mobilization: Downward Kneecap Push   With thumbs on upper border of left kneecap, gently push kneecap toward foot. Hold __10__ seconds. Repeat __10-20__ times per set. Do ___2-4_ sets per session. Do ___2-4_ sessions per day.  http://orth.exer.us/582   Copyright  VHI. All rights reserved.

## 2014-06-26 ENCOUNTER — Encounter: Payer: Self-pay | Admitting: Physical Therapy

## 2014-06-26 ENCOUNTER — Ambulatory Visit: Payer: Worker's Compensation | Admitting: Physical Therapy

## 2014-06-26 DIAGNOSIS — M25662 Stiffness of left knee, not elsewhere classified: Secondary | ICD-10-CM

## 2014-06-26 DIAGNOSIS — M25562 Pain in left knee: Secondary | ICD-10-CM | POA: Diagnosis not present
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
## 2014-06-29 ENCOUNTER — Encounter: Payer: Self-pay | Admitting: Physical Therapy

## 2014-06-29 ENCOUNTER — Ambulatory Visit: Payer: Worker's Compensation | Admitting: Physical Therapy

## 2014-06-29 DIAGNOSIS — M25562 Pain in left knee: Secondary | ICD-10-CM

## 2014-06-29 DIAGNOSIS — M25662 Stiffness of left knee, not elsewhere classified: Secondary | ICD-10-CM

## 2014-06-29 NOTE — Patient Instructions (Signed)
1-2 x dailyHip Hike   Stand on step, right leg off step, knee straight. Raise unsupported hip, keeping knee straight. Repeat _5-10___ times per set. Do __1-2__ sets per session. Do __1-2__ sessions   Terminal Knee Extension (Standing)   Facing anchor with right knee slightly bent and tubing just above knee, gently pull knee back straight. Do not overextend knee. Repeat _40___ times per set. Do _1___ sets per session. Do __1-2__ sessions per day.  http://orth.exer.us/666   Copyright  VHI. All rights reserved.
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
## 2014-07-04 ENCOUNTER — Ambulatory Visit: Payer: Worker's Compensation | Admitting: Physical Therapy

## 2014-07-04 ENCOUNTER — Encounter: Payer: Self-pay | Admitting: Physical Therapy

## 2014-07-04 DIAGNOSIS — M25562 Pain in left knee: Secondary | ICD-10-CM

## 2014-07-04 DIAGNOSIS — M25662 Stiffness of left knee, not elsewhere classified: Secondary | ICD-10-CM
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
## 2014-07-06 ENCOUNTER — Encounter: Payer: Self-pay | Admitting: *Deleted

## 2014-07-06 ENCOUNTER — Ambulatory Visit: Payer: Worker's Compensation | Attending: Orthopaedic Surgery | Admitting: *Deleted

## 2014-07-06 DIAGNOSIS — M25662 Stiffness of left knee, not elsewhere classified: Secondary | ICD-10-CM | POA: Insufficient documentation

## 2014-07-06 DIAGNOSIS — M25562 Pain in left knee: Secondary | ICD-10-CM | POA: Diagnosis not present
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
## 2014-07-12 ENCOUNTER — Emergency Department (HOSPITAL_COMMUNITY): Payer: Medicare Other

## 2014-07-12 ENCOUNTER — Emergency Department (HOSPITAL_COMMUNITY)
Admission: EM | Admit: 2014-07-12 | Discharge: 2014-07-12 | Disposition: A | Discharge status: Home / Self Care | Payer: Medicare Other | Attending: Emergency Medicine | Admitting: Emergency Medicine

## 2014-07-12 ENCOUNTER — Encounter (HOSPITAL_COMMUNITY): Payer: Self-pay | Admitting: *Deleted

## 2014-07-12 ENCOUNTER — Ambulatory Visit: Payer: Self-pay | Admitting: Pulmonary Disease

## 2014-07-12 DIAGNOSIS — S61210A Laceration without foreign body of right index finger without damage to nail, initial encounter: Secondary | ICD-10-CM | POA: Diagnosis not present

## 2014-07-12 DIAGNOSIS — Z85828 Personal history of other malignant neoplasm of skin: Secondary | ICD-10-CM | POA: Diagnosis not present

## 2014-07-12 DIAGNOSIS — Y288XXA Contact with other sharp object, undetermined intent, initial encounter: Secondary | ICD-10-CM | POA: Insufficient documentation

## 2014-07-12 DIAGNOSIS — M79646 Pain in unspecified finger(s): Secondary | ICD-10-CM | POA: Diagnosis not present

## 2014-07-12 DIAGNOSIS — M1990 Unspecified osteoarthritis, unspecified site: Secondary | ICD-10-CM | POA: Diagnosis not present

## 2014-07-12 DIAGNOSIS — S66520A Laceration of intrinsic muscle, fascia and tendon of right index finger at wrist and hand level, initial encounter: Secondary | ICD-10-CM | POA: Diagnosis not present

## 2014-07-12 DIAGNOSIS — E119 Type 2 diabetes mellitus without complications: Secondary | ICD-10-CM | POA: Insufficient documentation

## 2014-07-12 DIAGNOSIS — Z794 Long term (current) use of insulin: Secondary | ICD-10-CM | POA: Diagnosis not present

## 2014-07-12 DIAGNOSIS — E785 Hyperlipidemia, unspecified: Secondary | ICD-10-CM | POA: Diagnosis not present

## 2014-07-12 DIAGNOSIS — S61219A Laceration without foreign body of unspecified finger without damage to nail, initial encounter: Secondary | ICD-10-CM

## 2014-07-12 DIAGNOSIS — Y998 Other external cause status: Secondary | ICD-10-CM | POA: Diagnosis not present

## 2014-07-12 DIAGNOSIS — Y9289 Other specified places as the place of occurrence of the external cause: Secondary | ICD-10-CM | POA: Insufficient documentation

## 2014-07-12 DIAGNOSIS — R52 Pain, unspecified: Secondary | ICD-10-CM | POA: Diagnosis not present

## 2014-07-12 DIAGNOSIS — S6991XA Unspecified injury of right wrist, hand and finger(s), initial encounter: Secondary | ICD-10-CM | POA: Diagnosis not present

## 2014-07-12 DIAGNOSIS — I10 Essential (primary) hypertension: Secondary | ICD-10-CM | POA: Diagnosis not present

## 2014-07-12 DIAGNOSIS — Y9389 Activity, other specified: Secondary | ICD-10-CM | POA: Insufficient documentation

## 2014-07-12 DIAGNOSIS — Z79899 Other long term (current) drug therapy: Secondary | ICD-10-CM | POA: Diagnosis not present

## 2014-07-12 DIAGNOSIS — Z23 Encounter for immunization: Secondary | ICD-10-CM | POA: Insufficient documentation

## 2014-07-12 DIAGNOSIS — I4891 Unspecified atrial fibrillation: Secondary | ICD-10-CM | POA: Diagnosis not present

## 2014-07-12 DIAGNOSIS — Z87448 Personal history of other diseases of urinary system: Secondary | ICD-10-CM | POA: Diagnosis not present

## 2014-07-12 DIAGNOSIS — M7989 Other specified soft tissue disorders: Secondary | ICD-10-CM | POA: Diagnosis not present

## 2014-07-12 MED ORDER — LIDOCAINE HCL 2 % IJ SOLN
20.0000 mL | Freq: Once | INTRAMUSCULAR | Status: AC
  Administered 2014-07-12: 400 mg
  Filled 2014-07-12: qty 20

## 2014-07-12 MED ORDER — TETANUS-DIPHTH-ACELL PERTUSSIS 5-2.5-18.5 LF-MCG/0.5 IM SUSP
0.5000 mL | Freq: Once | INTRAMUSCULAR | Status: AC
  Administered 2014-07-12: 0.5 mL via INTRAMUSCULAR
  Filled 2014-07-12: qty 0.5

## 2014-07-12 MED ORDER — OXYCODONE-ACETAMINOPHEN 5-325 MG PO TABS
1.0000 | ORAL_TABLET | Freq: Once | ORAL | Status: AC
  Administered 2014-07-12: 1 via ORAL
  Filled 2014-07-12: qty 1

## 2014-07-12 MED ORDER — CEPHALEXIN 250 MG PO CAPS
500.0000 mg | ORAL_CAPSULE | Freq: Once | ORAL | Status: AC
  Administered 2014-07-12: 500 mg via ORAL
  Filled 2014-07-12: qty 2

## 2014-07-12 MED ORDER — FENTANYL CITRATE (PF) 100 MCG/2ML IJ SOLN
50.0000 ug | Freq: Once | INTRAMUSCULAR | Status: AC
  Administered 2014-07-12: 50 ug via INTRAVENOUS
  Filled 2014-07-12: qty 2

## 2014-07-12 MED ORDER — OXYCODONE-ACETAMINOPHEN 5-325 MG PO TABS: 1.0000 | ORAL_TABLET | Freq: Four times a day (QID) | ORAL | Status: DC | PRN

## 2014-07-12 MED ORDER — CEPHALEXIN 500 MG PO CAPS: 500.0000 mg | ORAL_CAPSULE | Freq: Four times a day (QID) | ORAL | Status: DC

## 2014-07-12 NOTE — ED Notes (Signed)
Pt A&OX4, ambulatory at d/c with steady gait, smiling and thanking staff for his care.

## 2014-07-12 NOTE — ED Notes (Signed)
Xray notified pt ready for transport
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
## 2014-07-12 NOTE — ED Notes (Signed)
Pt sts he was using a table saw and cut his hand.  Sts she cut through the pointer finger of the right hand and bone is visible.  Sts there is also a laceration to the middle finger as well.  Hand is currently wrapped and hemorrhage controlled, will leave bandage in place until PA able to come in.

## 2014-07-12 NOTE — Discharge Instructions (Signed)
°  Please read attached information. Please use antibiotic as directed, Percocet as needed for breakthrough pain. His follow-up with hand surgeon tomorrow morning to schedule a visit for follow-up evaluation and management. Please monitor for new or worsening signs symptoms including signs of infection. Return immediately if any present.

## 2014-07-12 NOTE — ED Notes (Signed)
Per EMS pt from [place] with c/o injury to right index finger with table saw. Finger is degloved, with bone showing. 18G LAC started. No pain medication given. VSS. CBG 134.

## 2014-07-13 ENCOUNTER — Encounter: Payer: Self-pay | Admitting: Physical Therapy

## 2014-07-14 ENCOUNTER — Encounter: Payer: Self-pay | Admitting: *Deleted

## 2014-07-14 ENCOUNTER — Encounter: Payer: Self-pay | Admitting: Physical Therapy

## 2014-07-14 ENCOUNTER — Ambulatory Visit: Payer: Self-pay | Admitting: Pulmonary Disease

## 2014-07-14 DIAGNOSIS — S61210A Laceration without foreign body of right index finger without damage to nail, initial encounter: Secondary | ICD-10-CM | POA: Diagnosis not present

## 2014-07-18 ENCOUNTER — Ambulatory Visit (INDEPENDENT_AMBULATORY_CARE_PROVIDER_SITE_OTHER): Payer: Medicare Other | Admitting: Pharmacist Clinician (PhC)/ Clinical Pharmacy Specialist

## 2014-07-18 DIAGNOSIS — I482 Chronic atrial fibrillation, unspecified: Secondary | ICD-10-CM

## 2014-07-18 LAB — POCT INR: INR: 1.9

## 2014-07-21 DIAGNOSIS — S61210D Laceration without foreign body of right index finger without damage to nail, subsequent encounter: Secondary | ICD-10-CM | POA: Diagnosis not present

## 2014-07-28 ENCOUNTER — Other Ambulatory Visit: Payer: Self-pay | Admitting: Family Medicine

## 2014-08-01 ENCOUNTER — Ambulatory Visit: Payer: Worker's Compensation | Admitting: Physical Therapy

## 2014-08-01 ENCOUNTER — Encounter: Payer: Self-pay | Admitting: Physical Therapy

## 2014-08-01 DIAGNOSIS — M25662 Stiffness of left knee, not elsewhere classified: Secondary | ICD-10-CM

## 2014-08-01 DIAGNOSIS — M25562 Pain in left knee: Secondary | ICD-10-CM | POA: Diagnosis not present
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
## 2014-08-02 ENCOUNTER — Encounter: Payer: Self-pay | Admitting: Physical Therapy

## 2014-08-02 ENCOUNTER — Ambulatory Visit: Payer: Worker's Compensation | Admitting: Physical Therapy

## 2014-08-02 DIAGNOSIS — M25662 Stiffness of left knee, not elsewhere classified: Secondary | ICD-10-CM

## 2014-08-02 DIAGNOSIS — M25562 Pain in left knee: Secondary | ICD-10-CM | POA: Diagnosis not present
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
## 2014-08-16 ENCOUNTER — Encounter: Payer: Self-pay | Admitting: *Deleted

## 2014-08-17 ENCOUNTER — Other Ambulatory Visit: Payer: Self-pay | Admitting: Family Medicine

## 2014-08-17 ENCOUNTER — Encounter: Payer: Self-pay | Admitting: Cardiovascular Disease

## 2014-08-18 DIAGNOSIS — S61210D Laceration without foreign body of right index finger without damage to nail, subsequent encounter: Secondary | ICD-10-CM | POA: Diagnosis not present

## 2014-08-23 ENCOUNTER — Other Ambulatory Visit: Payer: Self-pay | Admitting: Pulmonary Disease

## 2014-08-23 DIAGNOSIS — R0600 Dyspnea, unspecified: Secondary | ICD-10-CM

## 2014-08-24 ENCOUNTER — Ambulatory Visit (INDEPENDENT_AMBULATORY_CARE_PROVIDER_SITE_OTHER)
Admission: RE | Admit: 2014-08-24 | Discharge: 2014-08-24 | Disposition: A | Discharge status: Home / Self Care | Payer: Medicare Other | Source: Ambulatory Visit | Attending: Pulmonary Disease | Admitting: Pulmonary Disease

## 2014-08-24 ENCOUNTER — Encounter: Payer: Self-pay | Admitting: Pulmonary Disease

## 2014-08-24 ENCOUNTER — Ambulatory Visit (INDEPENDENT_AMBULATORY_CARE_PROVIDER_SITE_OTHER): Payer: Worker's Compensation | Admitting: Pulmonary Disease

## 2014-08-24 VITALS — BP 124/72 | HR 72 | Ht 71.0 in | Wt 147.0 lb

## 2014-08-24 DIAGNOSIS — Z77090 Contact with and (suspected) exposure to asbestos: Secondary | ICD-10-CM | POA: Diagnosis not present

## 2014-08-24 DIAGNOSIS — J61 Pneumoconiosis due to asbestos and other mineral fibers: Secondary | ICD-10-CM | POA: Diagnosis not present

## 2014-08-24 DIAGNOSIS — J449 Chronic obstructive pulmonary disease, unspecified: Secondary | ICD-10-CM | POA: Diagnosis not present

## 2014-08-24 DIAGNOSIS — R0600 Dyspnea, unspecified: Secondary | ICD-10-CM | POA: Diagnosis not present

## 2014-08-24 LAB — PULMONARY FUNCTION TEST
DL/VA % pred: 103 %
DL/VA: 4.84 ml/min/mmHg/L
DLCO UNC % PRED: 99 %
DLCO unc: 33.55 ml/min/mmHg
FEF 25-75 PRE: 4.46 L/s
FEF 25-75 Post: 4.93 L/sec
FEF2575-%CHANGE-POST: 10 %
FEF2575-%Pred-Post: 170 %
FEF2575-%Pred-Pre: 154 %
FEV1-%Change-Post: 1 %
FEV1-%PRED-POST: 118 %
FEV1-%Pred-Pre: 117 %
FEV1-POST: 4.3 L
FEV1-Pre: 4.24 L
FEV1FVC-%Change-Post: -2 %
FEV1FVC-%Pred-Pre: 112 %
FEV6-%Change-Post: 3 %
FEV6-%PRED-POST: 113 %
FEV6-%PRED-PRE: 109 %
FEV6-PRE: 5.01 L
FEV6-Post: 5.21 L
FEV6FVC-%Pred-Post: 105 %
FEV6FVC-%Pred-Pre: 105 %
FVC-%Change-Post: 3 %
FVC-%PRED-POST: 107 %
FVC-%Pred-Pre: 103 %
FVC-POST: 5.21 L
FVC-Pre: 5.01 L
POST FEV1/FVC RATIO: 83 %
POST FEV6/FVC RATIO: 100 %
PRE FEV1/FVC RATIO: 85 %
Pre FEV6/FVC Ratio: 100 %

## 2014-08-24 NOTE — Patient Instructions (Signed)
We will schedule a pulmonary function test and chest x-ray 1 year from now and see you after that

## 2014-08-24 NOTE — Assessment & Plan Note (Signed)
His lung function tests today is completely normal. He has no symptoms of asbestosis at this time. We will order a chest x-ray to ensure there is no evidence of lung disease. In then see him back in one year and repeat the studies again.
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
## 2014-08-24 NOTE — Progress Notes (Signed)
PFT done today. 

## 2014-09-05 ENCOUNTER — Ambulatory Visit (INDEPENDENT_AMBULATORY_CARE_PROVIDER_SITE_OTHER): Payer: Medicare Other | Admitting: Pharmacist

## 2014-09-05 DIAGNOSIS — I480 Paroxysmal atrial fibrillation: Secondary | ICD-10-CM

## 2014-09-05 LAB — POCT INR: INR: 1.9

## 2014-09-05 NOTE — Patient Instructions (Signed)
Anticoagulation Dose Instructions as of 09/05/2014      Christopher Avery Tue Wed Thu Fri Sat   New Dose 3.75 mg 2.5 mg 3.75 mg 2.5 mg 3.75 mg 2.5 mg 2.5 mg    Description        Increase current warfarin 2.5mg  dose to 1 and 1/2 tablets on sundays, tuesdays and thursdays.  Take 1 tablet mondays, wednesdays, fridays and saturdays.       INR was 1.9 today

## 2014-09-18 ENCOUNTER — Encounter: Payer: Self-pay | Admitting: *Deleted

## 2014-09-19 DIAGNOSIS — H11153 Pinguecula, bilateral: Secondary | ICD-10-CM | POA: Diagnosis not present

## 2014-09-19 DIAGNOSIS — H35373 Puckering of macula, bilateral: Secondary | ICD-10-CM | POA: Diagnosis not present

## 2014-09-19 DIAGNOSIS — E119 Type 2 diabetes mellitus without complications: Secondary | ICD-10-CM | POA: Diagnosis not present

## 2014-09-19 LAB — HM DIABETES EYE EXAM

## 2014-09-21 DIAGNOSIS — X32XXXD Exposure to sunlight, subsequent encounter: Secondary | ICD-10-CM | POA: Diagnosis not present

## 2014-09-21 DIAGNOSIS — L570 Actinic keratosis: Secondary | ICD-10-CM | POA: Diagnosis not present

## 2014-09-21 DIAGNOSIS — Z1283 Encounter for screening for malignant neoplasm of skin: Secondary | ICD-10-CM | POA: Diagnosis not present

## 2014-09-21 DIAGNOSIS — Z08 Encounter for follow-up examination after completed treatment for malignant neoplasm: Secondary | ICD-10-CM | POA: Diagnosis not present

## 2014-09-21 DIAGNOSIS — Z85820 Personal history of malignant melanoma of skin: Secondary | ICD-10-CM | POA: Diagnosis not present

## 2014-09-27 ENCOUNTER — Encounter: Payer: Self-pay | Admitting: *Deleted

## 2014-10-13 ENCOUNTER — Other Ambulatory Visit: Payer: Self-pay | Admitting: Family Medicine

## 2014-10-17 ENCOUNTER — Ambulatory Visit (INDEPENDENT_AMBULATORY_CARE_PROVIDER_SITE_OTHER): Payer: Medicare Other | Admitting: Pharmacist Clinician (PhC)/ Clinical Pharmacy Specialist

## 2014-10-17 DIAGNOSIS — I482 Chronic atrial fibrillation, unspecified: Secondary | ICD-10-CM

## 2014-10-17 LAB — POCT INR: INR: 1.7

## 2014-10-17 NOTE — Patient Instructions (Signed)
Anticoagulation Dose Instructions as of 10/17/2014      Dorene Grebe Tue Wed Thu Fri Sat   New Dose 3.75 mg 2.5 mg 3.75 mg 2.5 mg 3.75 mg 2.5 mg 3.75 mg    Description        2 tablets today and 1 1/2 tablet tomorrow and then change schedule to above

## 2014-10-24 ENCOUNTER — Ambulatory Visit (INDEPENDENT_AMBULATORY_CARE_PROVIDER_SITE_OTHER): Payer: Medicare Other | Admitting: Pharmacist Clinician (PhC)/ Clinical Pharmacy Specialist

## 2014-10-24 DIAGNOSIS — I482 Chronic atrial fibrillation, unspecified: Secondary | ICD-10-CM

## 2014-10-24 LAB — POCT INR: INR: 2.4

## 2014-11-03 ENCOUNTER — Other Ambulatory Visit: Payer: Self-pay | Admitting: Family Medicine

## 2014-11-07 ENCOUNTER — Telehealth: Payer: Self-pay | Admitting: Pharmacist Clinician (PhC)/ Clinical Pharmacy Specialist

## 2014-11-07 NOTE — Telephone Encounter (Signed)
Morning sugars are around 100 or less but by lunch is up to 200 after eating.  Patient taking between 15-19 units of lantus at bedtime.  Change glimepiride dose to before lunch  And lantus 18 units at bedtime .  Call me next Tuesday with results.

## 2014-11-09 ENCOUNTER — Other Ambulatory Visit: Payer: Self-pay | Admitting: Family Medicine

## 2014-11-14 ENCOUNTER — Telehealth: Payer: Self-pay | Admitting: Pharmacist Clinician (PhC)/ Clinical Pharmacy Specialist

## 2014-11-14 NOTE — Telephone Encounter (Signed)
Patient brought in home BG readings.  Mornings are running 140-165 and evenings are 200's.  He is taking Lantus 18u a day.  Instructed patient to increase to 20 units for 5 days and if BG still not averaging at 120, increase to 22units.

## 2014-11-21 ENCOUNTER — Encounter: Payer: Self-pay | Admitting: Family Medicine

## 2014-11-22 ENCOUNTER — Other Ambulatory Visit: Payer: Self-pay | Admitting: Family Medicine

## 2014-12-07 ENCOUNTER — Other Ambulatory Visit (INDEPENDENT_AMBULATORY_CARE_PROVIDER_SITE_OTHER): Payer: Medicare Other

## 2014-12-07 DIAGNOSIS — I482 Chronic atrial fibrillation, unspecified: Secondary | ICD-10-CM

## 2014-12-07 DIAGNOSIS — E559 Vitamin D deficiency, unspecified: Secondary | ICD-10-CM | POA: Diagnosis not present

## 2014-12-07 DIAGNOSIS — I10 Essential (primary) hypertension: Secondary | ICD-10-CM

## 2014-12-07 DIAGNOSIS — E119 Type 2 diabetes mellitus without complications: Secondary | ICD-10-CM | POA: Diagnosis not present

## 2014-12-07 DIAGNOSIS — E785 Hyperlipidemia, unspecified: Secondary | ICD-10-CM

## 2014-12-07 LAB — POCT GLYCOSYLATED HEMOGLOBIN (HGB A1C): Hemoglobin A1C: 8.7

## 2014-12-08 LAB — NMR, LIPOPROFILE
Cholesterol: 158 mg/dL (ref 100–199)
HDL CHOLESTEROL BY NMR: 57 mg/dL (ref >39)
HDL Particle Number: 32.4 umol/L (ref >=30.5)
LDL Particle Number: 1070 nmol/L — ABNORMAL HIGH (ref <1000)
LDL Size: 20.9 nm (ref >20.5)
LDL-C: 88 mg/dL (ref 0–99)
Small LDL Particle Number: 368 nmol/L (ref <=527)
Triglycerides by NMR: 66 mg/dL (ref 0–149)

## 2014-12-08 LAB — CBC WITH DIFFERENTIAL/PLATELET
Basophils Absolute: 0 10*3/uL (ref 0.0–0.2)
Basos: 0 %
EOS (ABSOLUTE): 0.1 10*3/uL (ref 0.0–0.4)
EOS: 1 %
HEMATOCRIT: 43.8 % (ref 37.5–51.0)
HEMOGLOBIN: 14.8 g/dL (ref 12.6–17.7)
Immature Grans (Abs): 0 10*3/uL (ref 0.0–0.1)
Immature Granulocytes: 0 %
LYMPHS ABS: 1.2 10*3/uL (ref 0.7–3.1)
Lymphs: 18 %
MCH: 31 pg (ref 26.6–33.0)
MCHC: 33.8 g/dL (ref 31.5–35.7)
MCV: 92 fL (ref 79–97)
MONOCYTES: 5 %
Monocytes Absolute: 0.3 10*3/uL (ref 0.1–0.9)
NEUTROS ABS: 5.1 10*3/uL (ref 1.4–7.0)
Neutrophils: 76 %
Platelets: 209 10*3/uL (ref 150–379)
RBC: 4.78 x10E6/uL (ref 4.14–5.80)
RDW: 13.6 % (ref 12.3–15.4)
WBC: 6.7 10*3/uL (ref 3.4–10.8)

## 2014-12-08 LAB — BMP8+EGFR
BUN / CREAT RATIO: 31 — AB (ref 10–22)
BUN: 23 mg/dL (ref 8–27)
CO2: 26 mmol/L (ref 18–29)
CREATININE: 0.75 mg/dL — AB (ref 0.76–1.27)
Calcium: 9.6 mg/dL (ref 8.6–10.2)
Chloride: 99 mmol/L (ref 97–106)
GFR, EST AFRICAN AMERICAN: 113 mL/min/{1.73_m2} (ref >59)
GFR, EST NON AFRICAN AMERICAN: 98 mL/min/{1.73_m2} (ref >59)
Glucose: 101 mg/dL — ABNORMAL HIGH (ref 65–99)
Potassium: 4 mmol/L (ref 3.5–5.2)
Sodium: 141 mmol/L (ref 136–144)

## 2014-12-08 LAB — HEPATIC FUNCTION PANEL
ALBUMIN: 4.5 g/dL (ref 3.6–4.8)
ALK PHOS: 57 IU/L (ref 39–117)
ALT: 19 IU/L (ref 0–44)
AST: 23 IU/L (ref 0–40)
Bilirubin Total: 0.5 mg/dL (ref 0.0–1.2)
Bilirubin, Direct: 0.15 mg/dL (ref 0.00–0.40)
Total Protein: 6.6 g/dL (ref 6.0–8.5)

## 2014-12-08 LAB — VITAMIN D 25 HYDROXY (VIT D DEFICIENCY, FRACTURES): VIT D 25 HYDROXY: 43.7 ng/mL (ref 30.0–100.0)

## 2014-12-12 ENCOUNTER — Encounter: Payer: Self-pay | Admitting: Family Medicine

## 2014-12-12 ENCOUNTER — Ambulatory Visit (INDEPENDENT_AMBULATORY_CARE_PROVIDER_SITE_OTHER): Payer: Medicare Other | Admitting: Family Medicine

## 2014-12-12 VITALS — BP 140/91 | HR 59 | Temp 97.3°F | Ht 71.0 in | Wt 151.0 lb

## 2014-12-12 DIAGNOSIS — N400 Enlarged prostate without lower urinary tract symptoms: Secondary | ICD-10-CM | POA: Diagnosis not present

## 2014-12-12 DIAGNOSIS — I482 Chronic atrial fibrillation, unspecified: Secondary | ICD-10-CM

## 2014-12-12 DIAGNOSIS — I10 Essential (primary) hypertension: Secondary | ICD-10-CM | POA: Diagnosis not present

## 2014-12-12 DIAGNOSIS — E119 Type 2 diabetes mellitus without complications: Secondary | ICD-10-CM | POA: Diagnosis not present

## 2014-12-12 DIAGNOSIS — E559 Vitamin D deficiency, unspecified: Secondary | ICD-10-CM | POA: Diagnosis not present

## 2014-12-12 DIAGNOSIS — Z23 Encounter for immunization: Secondary | ICD-10-CM

## 2014-12-12 DIAGNOSIS — N5201 Erectile dysfunction due to arterial insufficiency: Secondary | ICD-10-CM | POA: Diagnosis not present

## 2014-12-12 DIAGNOSIS — E785 Hyperlipidemia, unspecified: Secondary | ICD-10-CM

## 2014-12-12 DIAGNOSIS — Z0000 Encounter for general adult medical examination without abnormal findings: Secondary | ICD-10-CM

## 2014-12-12 LAB — POCT UA - MICROSCOPIC ONLY
Bacteria, U Microscopic: NEGATIVE
Casts, Ur, LPF, POC: NEGATIVE
Crystals, Ur, HPF, POC: NEGATIVE
Mucus, UA: NEGATIVE
RBC, URINE, MICROSCOPIC: NEGATIVE
Yeast, UA: NEGATIVE

## 2014-12-12 LAB — POCT URINALYSIS DIPSTICK
Bilirubin, UA: NEGATIVE
Blood, UA: NEGATIVE
Glucose, UA: NEGATIVE
Ketones, UA: NEGATIVE
LEUKOCYTES UA: NEGATIVE
Nitrite, UA: NEGATIVE
PH UA: 5
PROTEIN UA: NEGATIVE
SPEC GRAV UA: 1.01
UROBILINOGEN UA: NEGATIVE

## 2014-12-12 LAB — POCT UA - MICROALBUMIN: MICROALBUMIN (UR) POC: NEGATIVE mg/L

## 2014-12-12 MED ORDER — GLIMEPIRIDE 2 MG PO TABS: 1.0000 mg | ORAL_TABLET | Freq: Every day | ORAL | Status: DC

## 2014-12-12 MED ORDER — GLIMEPIRIDE 4 MG PO TABS: ORAL_TABLET | ORAL | Status: DC

## 2014-12-12 NOTE — Progress Notes (Signed)
Subjective:    [name]: Christopher Avery, male    DOB: 1951-02-03, 64 y.o.   MRN: [id number]  HPI Patient is here today for annual wellness exam and follow up of chronic medical problems which includes hypertension and diabetes. He is taking medications regularly. The patient is doing well he is still concerned about not gaining enough weight. His most recent hemoglobin A1c was 8.7% and that compares to previous one that was 8.4%. He still taking his insulin and glimepiride we will review his lab work with him today and he will get his flu shot today. He will be given an FOBT to return and we'll get a PSA and a prostate exam today. The patient is doing well overall except that he would like to gain some weight. We called in the clinical pharmacists who is a certified diabetic educator and discussed how we can get the A1c down below 8. The patient was present with this discussion. The patient denies chest pain shortness of breath trouble swallowing heartburn indigestion nausea vomiting diarrhea or blood in the stool. His blood pressure was elevated when taken by the nurse this morning but he forgot to take his blood pressure medication and normally his blood pressures are very good and in the 110 range at home. The patient still has erectile dysfunction. He is seen the eye doctor recently and he gave him a good report that there was no problem with his eyes.     Patient Active Problem List   Diagnosis Date Noted  . Incisional hernia, without obstruction or gangrene 11/29/2013  . Vitamin D deficiency 11/29/2013  . Conjunctival abrasion 09/14/2012  . Foreign body of left eye 09/13/2012  . Corneal abrasion 09/13/2012  . Type 2 diabetes mellitus treated with insulin ([place]) 07/29/2012  . Long term (current) use of anticoagulants 04/22/2012  . Hyperlipidemia 06/23/2008  . Benign essential HTN 06/23/2008  . Paroxysmal atrial fibrillation ([place]) 06/23/2008  . 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 KIDNEY  DISEASE 06/23/2008   Outpatient Encounter Prescriptions as of 12/12/2014  Medication Sig  . atorvastatin (LIPITOR) 10 MG tablet Take 5 mg by mouth daily.    [name] CINNAMON PO Take 1 capsule by mouth at bedtime.   [name] co-enzyme Q-10 30 MG capsule Take 100 mg by mouth daily.   [name] diltiazem (CARDIZEM CD) 180 MG 24 hr capsule Take 1 capsule (180 mg total) by mouth daily.  [name] glimepiride (AMARYL) 4 MG tablet TAKE 1 TABLET (4 MG TOTAL) BY MOUTH DAILY BEFORE BREAKFAST.  [name] Insulin Glargine (LANTUS SOLOSTAR) 100 UNIT/ML Solostar Pen Inject 17 Units into the skin daily at 10 pm.  . LANTUS SOLOSTAR 100 UNIT/ML Solostar Pen INJECT 10 UNITS INTO THE SKIN DAILY AT 10 PM.  . oxyCODONE (OXY IR/ROXICODONE) 5 MG immediate release tablet Take 1-2 tablets (5-10 mg total) by mouth every 4 (four) hours as needed for moderate pain, severe pain or breakthrough pain.  . sildenafil (REVATIO) 20 MG tablet TAKE 2-5 TABLETS BY MOUTH AS NEEDED PRIOR TO SEXUAL ACTIVITY.  . sotalol (BETAPACE) 80 MG tablet Take 1 tablet (80 mg total) by mouth 2 (two) times daily.  . valACYclovir (VALTREX) 500 MG tablet TAKE 4 TABLETS BY MOUTH TWICE DAILY AS DIRECTED AND AS NEEDED  . warfarin (COUMADIN) 2.5 MG tablet TAKE 1 TO 1 & 1/2 TABLETS BY MOUTH DAILY AS DIRECTED.  . [DISCONTINUED] warfarin (COUMADIN) 2.5 MG tablet Take 2.5-3.75 mg by mouth daily. 2.5mg  on Tuesdays and Thursdays, 3.75mg  all other  days  . [DISCONTINUED] sildenafil (REVATIO) 20 MG tablet Take 2-5 tabs PRN prior to sexual activity.  . [DISCONTINUED] valACYclovir (VALTREX) 500 MG tablet TAKE 4 TABLETS BY MOUTH TWICE DAILY AS DIRECTED AND AS NEEDED   No facility-administered encounter medications on file as of 12/12/2014.      Review of Systems  Constitutional: Positive for unexpected weight change (weight loss - would like to gain some weight ).  HENT: Negative.   Eyes: Negative.   Respiratory: Negative.   Cardiovascular: Negative.   Gastrointestinal: Negative.   Endocrine:  Negative.   Genitourinary: Negative.   Musculoskeletal: Negative.   Skin: Negative.   Allergic/Immunologic: Negative.   Neurological: Negative.   Hematological: Negative.   Psychiatric/Behavioral: Negative.        Objective:   Physical Exam  Constitutional: He is oriented to person, place, and time. He appears well-developed and well-nourished. No distress.  Thin and alert  HENT:  Head: Normocephalic and atraumatic.  Right Ear: External ear normal.  Left Ear: External ear normal.  Mouth/Throat: Oropharynx is clear and moist. No oropharyngeal exudate.  Nasal turbinate congestion  Eyes: Conjunctivae and EOM are normal. Pupils are equal, round, and reactive to light. Right eye exhibits no discharge. Left eye exhibits no discharge. No scleral icterus.  Neck: Normal range of motion. Neck supple. No thyromegaly present.  No bruits or thyromegaly  Cardiovascular: Normal rate, regular rhythm, normal heart sounds and intact distal pulses.   No murmur heard. Heart has a regular rate and rhythm at 60/m  Pulmonary/Chest: Effort normal and breath sounds normal. No respiratory distress. He has no wheezes. He has no rales. He exhibits no tenderness.  Clear anteriorly and posteriorly  Abdominal: Soft. Bowel sounds are normal. He exhibits no mass. There is no tenderness. There is no rebound and no guarding.  No abdominal tenderness masses or organ enlargement  Genitourinary: Rectum normal and penis normal.  The prostate was enlarged but smooth without lumps or masses. There are no rectal masses. There were no inguinal hernias palpable.  Musculoskeletal: Normal range of motion. He exhibits no edema.  Lymphadenopathy:    He has no cervical adenopathy.  Neurological: He is alert and oriented to person, place, and time. He has normal reflexes. No cranial nerve deficit.  The patient has had a left knee replacement and otherwise reflexes were normal with good mobility of all extremities  Skin: Skin is  warm and dry. No rash noted.  Dry skin on both feet  Psychiatric: He has a normal mood and affect. His behavior is normal. Judgment and thought content normal.  Nursing note and vitals reviewed.  BP 140/91 mmHg  Pulse 59  Temp(Src) 97.3 F (36.3 C) (Oral)  Ht 5\' 11"  (1.803 m)  Wt 151 lb (68.493 kg)  BMI 21.07 kg/m2        Assessment & Plan:  1. Chronic atrial fibrillation (HCC) -Follow-up with cardiology as planned  2. Type 2 diabetes mellitus not at goal [facility] -Add glimepiride 1 mg at or before dinner every night and make sure patient checks blood sugars closely for the next few days. - POCT UA - Microalbumin  3. Hyperlipidemia -Continue current treatment and continue with aggressive therapeutic lifestyle changes  4. Benign essential HTN -The patient had not taken his blood pressure medicine this morning and no treatment changes will be made. He will have his blood pressure rechecked by the clinical pharmacist in a couple weeks. His home blood pressures have been very good.  5.  Vitamin D deficiency -Continue current treatment  6. Annual physical exam -Patient received flu shot today and he was given samples of Cialis to try for erectile dysfunction - POCT UA - Microalbumin - POCT UA - Microscopic Only - POCT urinalysis dipstick  7. Erectile dysfunction due to arterial insufficiency -Trial of Cialis for erectile dysfunction  8. BPH (benign prostatic hyperplasia) -The patient is having no symptoms with his enlarged prostate.  Meds ordered this encounter  Medications  . glimepiride (AMARYL) 4 MG tablet    Sig: TAKE 1 TABLET (4 MG TOTAL) BY MOUTH DAILY BEFORE BREAKFAST.    Dispense:  90 tablet    Refill:  3  . glimepiride (AMARYL) 2 MG tablet    Sig: Take 0.5 tablets (1 mg total) by mouth daily before supper.    Dispense:  45 tablet    Refill:  3   Patient Instructions                       Medicare Annual Wellness Visit  [place] and the medical providers  at [place] strive to bring you the best medical care.  In doing so we not only want to address your current medical conditions and concerns but also to detect new conditions early and prevent illness, disease and health-related problems.    Medicare offers a yearly Wellness Visit which allows our clinical staff to assess your need for preventative services including immunizations, lifestyle education, counseling to decrease risk of preventable diseases and screening for fall risk and other medical concerns.    This visit is provided free of charge (no copay) for all Medicare recipients. The clinical pharmacists at [place] have begun to conduct these Wellness Visits which will also include a thorough review of all your medications.    As you primary medical provider recommend that you make an appointment for your Annual Wellness Visit if you have not done so already this year.  You may set up this appointment before you leave today or you may call back ([phone]) and schedule an appointment.  Please make sure when you call that you mention that you are scheduling your Annual Wellness Visit with the clinical pharmacist so that the appointment may be made for the proper length of time.     Continue current medications. Continue good therapeutic lifestyle changes which include good diet and exercise. Fall precautions discussed with patient. If an FOBT was given today- please return it to our front desk. If you are over 87 years old - you may need Prevnar 39 or the adult Pneumonia vaccine.  **Flu shots are available--- please call and schedule a FLU-CLINIC appointment**  After your visit with [place] today you will receive a survey in the mail or online from [employer] regarding your care with [place]. Please take a moment to fill this out. Your feedback is very important to [place] as you can help [place] better understand your patient needs as well as improve your  experience and satisfaction. WE CARE ABOUT YOU!!!   Please check your blood sugars more closely over the next few days and especially at bedtime. Return this information with you to the visit to be with the clinical pharmacists and 2 weeks. Take one half of an Amaryl or glimepiride which would be 1 mg before supper nightly Continue the glimepiride in the morning as doing Continue the current insulin treatment regimen We will give you some samples of Cialis  20 and 10 to Avery if this is helpful Use nasal saline for nasal congestion and patient could also start some Flonase 1 spray each nostril at bedtime for allergic rhinitis   Arrie Senate MD

## 2014-12-12 NOTE — Patient Instructions (Addendum)
Medicare Annual Wellness Visit  [place] and the medical providers at [place] strive to bring you the best medical care.  In doing so we not only want to address your current medical conditions and concerns but also to detect new conditions early and prevent illness, disease and health-related problems.    Medicare offers a yearly Wellness Visit which allows our clinical staff to assess your need for preventative services including immunizations, lifestyle education, counseling to decrease risk of preventable diseases and screening for fall risk and other medical concerns.    This visit is provided free of charge (no copay) for all Medicare recipients. The clinical pharmacists at [place] have begun to conduct these Wellness Visits which will also include a thorough review of all your medications.    As you primary medical provider recommend that you make an appointment for your Annual Wellness Visit if you have not done so already this year.  You may set up this appointment before you leave today or you may call back ([phone]) and schedule an appointment.  Please make sure when you call that you mention that you are scheduling your Annual Wellness Visit with the clinical pharmacist so that the appointment may be made for the proper length of time.     Continue current medications. Continue good therapeutic lifestyle changes which include good diet and exercise. Fall precautions discussed with patient. If an FOBT was given today- please return it to our front desk. If you are over 48 years old - you may need Prevnar 20 or the adult Pneumonia vaccine.  **Flu shots are available--- please call and schedule a FLU-CLINIC appointment**  After your visit with [place] today you will receive a survey in the mail or online from [employer] regarding your care with [place]. Please take a moment to fill this out. Your feedback is very  important to [place] as you can help [place] better understand your patient needs as well as improve your experience and satisfaction. WE CARE ABOUT YOU!!!   Please check your blood sugars more closely over the next few days and especially at bedtime. Return this information with you to the visit to be with the clinical pharmacists and 2 weeks. Take one half of an Amaryl or glimepiride which would be 1 mg before supper nightly Continue the glimepiride in the morning as doing Continue the current insulin treatment regimen We will give you some samples of Cialis 20 and 10 to see if this is helpful Use nasal saline for nasal congestion and patient could also start some Flonase 1 spray each nostril at bedtime for allergic rhinitis

## 2014-12-20 ENCOUNTER — Telehealth: Payer: Self-pay | Admitting: Family Medicine

## 2014-12-20 ENCOUNTER — Other Ambulatory Visit: Payer: Self-pay | Admitting: Family Medicine

## 2014-12-21 NOTE — Telephone Encounter (Signed)
Stp and he states [name] was supposed to get him samples of viagra.

## 2014-12-25 NOTE — Telephone Encounter (Signed)
Pt aware - no samples right now  He states he does not need RF on rx for this

## 2014-12-27 ENCOUNTER — Other Ambulatory Visit: Payer: Self-pay | Admitting: Family Medicine

## 2015-01-02 ENCOUNTER — Ambulatory Visit (INDEPENDENT_AMBULATORY_CARE_PROVIDER_SITE_OTHER): Payer: Medicare Other | Admitting: Pharmacist Clinician (PhC)/ Clinical Pharmacy Specialist

## 2015-01-02 ENCOUNTER — Encounter: Payer: Self-pay | Admitting: Pharmacist Clinician (PhC)/ Clinical Pharmacy Specialist

## 2015-01-02 DIAGNOSIS — I482 Chronic atrial fibrillation, unspecified: Secondary | ICD-10-CM

## 2015-01-02 LAB — POCT INR: INR: 1.8

## 2015-01-02 NOTE — Patient Instructions (Signed)
Anticoagulation Dose Instructions as of 01/02/2015      Dorene Grebe Tue Wed Thu Fri Sat   New Dose 3.75 mg 2.5 mg 3.75 mg 2.5 mg 3.75 mg 2.5 mg 3.75 mg    Description        Take 2 tablets (5mg ) today (01/02/2015). Then continue same dose.

## 2015-01-02 NOTE — Progress Notes (Signed)
Counseled patient on dietary habits and exercise for controlling type 2 diabetes.  Reviewed patient's medications and BG goals.  Patient had been consuming too many sweets so we discussed alternatives.  His BG have been coming down and he will increase his lantus by 2 units if FBG are above 140mg /dL.

## 2015-01-15 ENCOUNTER — Telehealth: Payer: Self-pay | Admitting: Pharmacist Clinician (PhC)/ Clinical Pharmacy Specialist

## 2015-01-16 MED ORDER — INSULIN ASPART 100 UNIT/ML FLEXPEN: 2.0000 [IU] | PEN_INJECTOR | Freq: Two times a day (BID) | SUBCUTANEOUS | Status: DC

## 2015-01-16 NOTE — Telephone Encounter (Signed)
Patient called with fluctuating blood glucose levels going from 80-300mg /dL with no changes in diet or activity levels.  He is following an excellent diet for diabetes and exercises daily.  I will temporarily add novolog bid with two largest meals based on a sliding scale on when to give Novolog.  Patient is bringing his mother in to the office for an appointment today and will stop by and get the sliding scale from me.  BG >300mg /dL 4 units BG>250mg /dL 3 units BG >200mg /dL 2 units

## 2015-01-17 ENCOUNTER — Telehealth: Payer: Self-pay | Admitting: Family Medicine

## 2015-01-18 NOTE — Telephone Encounter (Signed)
LM - 12/15-jhb

## 2015-01-19 MED ORDER — INSULIN DETEMIR 100 UNIT/ML FLEXPEN: 20.0000 [IU] | PEN_INJECTOR | Freq: Every day | SUBCUTANEOUS | Status: DC

## 2015-01-19 NOTE — Telephone Encounter (Signed)
Switch to Levemir  as directed by clinical pharmacist----call patient

## 2015-01-19 NOTE — Telephone Encounter (Signed)
Currently taking lantus 20 units once daily.   Unfortunately Nancee Liter is not available yet.  Anticipated launch date is any time from December 21st to January 21st.  I would recommend for now switch to Levemir.  Patient will refill his lantus for December and then start Levemir in 2017.

## 2015-01-19 NOTE — Telephone Encounter (Signed)
Insurance prefers (as of January 1st)  Basaglar Levemir Tyler Aas  Tammy and DWM,  What would you suggest we send in for him.

## 2015-01-19 NOTE — Telephone Encounter (Signed)
Christopher Avery I dissected you know about this patient and his insulin and I would recommend the basaglar.

## 2015-02-07 ENCOUNTER — Telehealth: Payer: Self-pay | Admitting: Family Medicine

## 2015-02-07 MED ORDER — SILDENAFIL CITRATE 20 MG PO TABS: ORAL_TABLET | ORAL | Status: DC

## 2015-02-07 NOTE — Telephone Encounter (Signed)
Med not in Epic

## 2015-02-07 NOTE — Telephone Encounter (Signed)
Med is sildenafil  - NOT Seroquel

## 2015-02-13 ENCOUNTER — Encounter: Payer: Self-pay | Admitting: Pharmacist Clinician (PhC)/ Clinical Pharmacy Specialist

## 2015-02-13 ENCOUNTER — Ambulatory Visit (INDEPENDENT_AMBULATORY_CARE_PROVIDER_SITE_OTHER): Payer: Medicare Other | Admitting: Pharmacist Clinician (PhC)/ Clinical Pharmacy Specialist

## 2015-02-13 ENCOUNTER — Other Ambulatory Visit: Payer: Self-pay

## 2015-02-13 ENCOUNTER — Telehealth: Payer: Self-pay | Admitting: Family Medicine

## 2015-02-13 DIAGNOSIS — I482 Chronic atrial fibrillation, unspecified: Secondary | ICD-10-CM

## 2015-02-13 LAB — POCT INR
INR: 2.6
INR: 1.8

## 2015-02-13 MED ORDER — INSULIN GLARGINE 100 UNIT/ML SOLOSTAR PEN: 10.0000 [IU] | PEN_INJECTOR | Freq: Every day | SUBCUTANEOUS | Status: DC

## 2015-02-13 NOTE — Patient Instructions (Signed)
Anticoagulation Dose Instructions as of 02/13/2015      Dorene Grebe Tue Wed Thu Fri Sat   New Dose 3.75 mg 2.5 mg 3.75 mg 3.75 mg 3.75 mg 2.5 mg 3.75 mg    Description        Increase Wednesdays to 1 1/2 tablets and continue the rest of the schedule the same.

## 2015-02-13 NOTE — Telephone Encounter (Signed)
Pharm aware to switch from lantus to Levemir -- this was already in Tammy's note and had been sent to [facility]

## 2015-02-13 NOTE — Telephone Encounter (Signed)
Last seen 12/12/14  DWM  This med not on EPIC list  Sent from [facility]

## 2015-02-22 ENCOUNTER — Telehealth: Payer: Self-pay | Admitting: Family Medicine

## 2015-02-22 NOTE — Telephone Encounter (Signed)
Patient is going to call back to the pharmacy to see what is going on.

## 2015-02-26 ENCOUNTER — Telehealth: Payer: Self-pay | Admitting: Family Medicine

## 2015-02-26 NOTE — Telephone Encounter (Signed)
Not sure which needles

## 2015-03-07 ENCOUNTER — Telehealth: Payer: Self-pay | Admitting: Family Medicine

## 2015-03-07 MED ORDER — WARFARIN SODIUM 2.5 MG PO TABS: ORAL_TABLET | ORAL | Status: DC

## 2015-03-07 MED ORDER — GLIMEPIRIDE 2 MG PO TABS: 1.0000 mg | ORAL_TABLET | Freq: Every day | ORAL | Status: DC

## 2015-03-07 MED ORDER — GLIMEPIRIDE 4 MG PO TABS: ORAL_TABLET | ORAL | Status: DC

## 2015-03-07 NOTE — Telephone Encounter (Signed)
Called pt, he [facility]. He needs both glimeperides and warfarin

## 2015-03-08 ENCOUNTER — Other Ambulatory Visit: Payer: Self-pay | Admitting: Cardiovascular Disease

## 2015-03-08 NOTE — Telephone Encounter (Signed)
Rx(s) sent to pharmacy electronically.  

## 2015-03-26 DIAGNOSIS — L570 Actinic keratosis: Secondary | ICD-10-CM | POA: Diagnosis not present

## 2015-03-26 DIAGNOSIS — Z08 Encounter for follow-up examination after completed treatment for malignant neoplasm: Secondary | ICD-10-CM | POA: Diagnosis not present

## 2015-03-26 DIAGNOSIS — Z85820 Personal history of malignant melanoma of skin: Secondary | ICD-10-CM | POA: Diagnosis not present

## 2015-03-26 DIAGNOSIS — X32XXXD Exposure to sunlight, subsequent encounter: Secondary | ICD-10-CM | POA: Diagnosis not present

## 2015-03-26 DIAGNOSIS — Z1283 Encounter for screening for malignant neoplasm of skin: Secondary | ICD-10-CM | POA: Diagnosis not present

## 2015-03-27 ENCOUNTER — Ambulatory Visit (INDEPENDENT_AMBULATORY_CARE_PROVIDER_SITE_OTHER): Payer: Medicare Other | Admitting: Pharmacist Clinician (PhC)/ Clinical Pharmacy Specialist

## 2015-03-27 DIAGNOSIS — I482 Chronic atrial fibrillation, unspecified: Secondary | ICD-10-CM

## 2015-03-27 LAB — POCT INR: INR: 3.1

## 2015-03-27 NOTE — Patient Instructions (Addendum)
Anticoagulation Dose Instructions as of 03/27/2015      Sun Mon Tue Wed Thu Fri Sat   New Dose 3.75 mg 2.5 mg 3.75 mg 2.5 mg 3.75 mg 2.5 mg 3.75 mg    Description        Continue same dose  INR today is 3.1. Increase vitamin K foods in diet once a week

## 2015-04-04 ENCOUNTER — Other Ambulatory Visit: Payer: Self-pay | Admitting: Family Medicine

## 2015-04-04 ENCOUNTER — Telehealth: Payer: Self-pay | Admitting: Family Medicine

## 2015-04-04 NOTE — Telephone Encounter (Signed)
Last seen 12/12/14 DWM  If approved route to nurse to call into [facility]

## 2015-04-05 ENCOUNTER — Ambulatory Visit (INDEPENDENT_AMBULATORY_CARE_PROVIDER_SITE_OTHER): Payer: Medicare Other | Admitting: Pediatrics

## 2015-04-05 ENCOUNTER — Ambulatory Visit (INDEPENDENT_AMBULATORY_CARE_PROVIDER_SITE_OTHER): Payer: Medicare Other

## 2015-04-05 ENCOUNTER — Encounter: Payer: Self-pay | Admitting: Pediatrics

## 2015-04-05 VITALS — BP 109/63 | HR 65 | Temp 97.5°F | Ht 71.0 in | Wt 158.4 lb

## 2015-04-05 DIAGNOSIS — M79671 Pain in right foot: Secondary | ICD-10-CM

## 2015-04-05 NOTE — Progress Notes (Signed)
    Subjective:    [name]: Christopher Avery, male    DOB: 04/18/1950, 65 y.o.   MRN: [id number]  CC: Foot Pain   HPI: Christopher Avery is a 65 y.o. male presenting for Foot Pain  Was playing golf this morning, during a swing toward his R foot he felt something pop in his R great toe, MTP joint.  Able to weight bear immediately but with pain Limps with walking Pain if extends R great toe  Slightly swollen compared to L side, taking tylenol for pain No prior injuries to this foot   Depression screen [facility] 2/9 04/05/2015 12/12/2014 06/07/2014  Decreased Interest 0 0 0  Down, Depressed, Hopeless 0 0 0  PHQ - 2 Score 0 0 0     Relevant past medical, surgical, family and social history reviewed and updated as indicated. Interim medical history since our last visit reviewed. Allergies and medications reviewed and updated.    ROS: Per HPI unless specifically indicated above  History  Smoking status  . Never Smoker   Smokeless tobacco  . Never Used    Past Medical History Patient Active Problem List   Diagnosis Date Noted  . Incisional hernia, without obstruction or gangrene 11/29/2013  . Vitamin D deficiency 11/29/2013  . Conjunctival abrasion 09/14/2012  . Foreign body of left eye 09/13/2012  . Corneal abrasion 09/13/2012  . Type 2 diabetes mellitus treated with insulin ([place]) 07/29/2012  . Long term (current) use of anticoagulants 04/22/2012  . Hyperlipidemia 06/23/2008  . Benign essential HTN 06/23/2008  . Paroxysmal atrial fibrillation ([place]) 06/23/2008  . 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 KIDNEY DISEASE 06/23/2008        Objective:    BP 109/63 mmHg  Pulse 65  Temp(Src) 97.5 F (36.4 C) (Oral)  Ht 5\' 11"  (1.803 m)  Wt 158 lb 6.4 oz (71.85 kg)  BMI 22.10 kg/m2  Wt Readings from Last 3 Encounters:  04/05/15 158 lb 6.4 oz (71.85 kg)  12/12/14 151 lb (68.493 kg)  08/24/14 147 lb (66.679 kg)     Gen: NAD, alert, cooperative with exam, NCAT EYES: EOMI, no  scleral injection or icterus CV: WWP, DP pulses 2+ b/l Resp:normal WOB Neuro: Alert and oriented, sensation intact b/l MSK: R MTP slightly swollen compared with L MTP. Able to extend R toes, some pain with resistance and extension over 20 degrees. Mild point tenderness over dorsum of R MTP joint. No point tenderness  Anywhere else on foot or ankle R foot. Normal ROM ankle b/l. No pain with squeeze of forefoot.     Assessment & Plan:    Christopher Avery was seen today for foot pain, xray with no acute abnormalities or fractures. Discussed symptomatic care, rest, ice, elevation, tylenol. Let me know if not improving.  Diagnoses and all orders for this visit:  Right foot pain -     DG Foot Complete Right; Future   Follow up plan: As needed  Assunta Found, MD [facility] 04/05/2015, 6:07 PM

## 2015-04-05 NOTE — Telephone Encounter (Signed)
lmovm per ROI that [facility]

## 2015-04-23 ENCOUNTER — Telehealth: Payer: Self-pay | Admitting: *Deleted

## 2015-04-23 MED ORDER — INSULIN DETEMIR 100 UNIT/ML FLEXPEN: 20.0000 [IU] | PEN_INJECTOR | Freq: Every day | SUBCUTANEOUS | Status: DC

## 2015-04-23 MED ORDER — DILTIAZEM HCL ER COATED BEADS 180 MG PO CP24: 180.0000 mg | ORAL_CAPSULE | Freq: Every day | ORAL | Status: DC

## 2015-04-23 NOTE — Telephone Encounter (Signed)
done

## 2015-04-27 ENCOUNTER — Other Ambulatory Visit: Payer: Self-pay | Admitting: Cardiovascular Disease

## 2015-05-08 ENCOUNTER — Ambulatory Visit (INDEPENDENT_AMBULATORY_CARE_PROVIDER_SITE_OTHER): Payer: Medicare Other | Admitting: Pharmacist Clinician (PhC)/ Clinical Pharmacy Specialist

## 2015-05-08 DIAGNOSIS — I482 Chronic atrial fibrillation, unspecified: Secondary | ICD-10-CM

## 2015-05-08 LAB — COAGUCHEK XS/INR WAIVED
INR: 1.7 — ABNORMAL HIGH (ref 0.9–1.1)
Prothrombin Time: 20.8 s

## 2015-05-08 NOTE — Patient Instructions (Signed)
Anticoagulation Dose Instructions as of 05/08/2015      Christopher Avery Tue Wed Thu Fri Sat   New Dose 3.75 mg 2.5 mg 3.75 mg 2.5 mg 3.75 mg 2.5 mg 3.75 mg    Description        Take 2 tablets today and tomorrow, then resume regular schedule.

## 2015-06-07 ENCOUNTER — Other Ambulatory Visit: Payer: Self-pay | Admitting: Cardiovascular Disease

## 2015-06-07 NOTE — Telephone Encounter (Signed)
Rx(s) sent to pharmacy electronically.  

## 2015-06-13 ENCOUNTER — Other Ambulatory Visit: Payer: Self-pay | Admitting: *Deleted

## 2015-06-13 MED ORDER — INSULIN ASPART 100 UNIT/ML FLEXPEN: 2.0000 [IU] | PEN_INJECTOR | Freq: Two times a day (BID) | SUBCUTANEOUS | Status: DC

## 2015-06-19 ENCOUNTER — Ambulatory Visit (INDEPENDENT_AMBULATORY_CARE_PROVIDER_SITE_OTHER): Payer: Medicare Other | Admitting: Pharmacist Clinician (PhC)/ Clinical Pharmacy Specialist

## 2015-06-19 DIAGNOSIS — I482 Chronic atrial fibrillation, unspecified: Secondary | ICD-10-CM

## 2015-06-19 LAB — COAGUCHEK XS/INR WAIVED
INR: 1.5 — AB (ref 0.9–1.1)
Prothrombin Time: 17.9 s

## 2015-06-19 NOTE — Patient Instructions (Signed)
Anticoagulation Dose Instructions as of 06/19/2015      Christopher Avery Tue Wed Thu Fri Sat   New Dose 3.75 mg 3.75 mg 3.75 mg 3.75 mg 3.75 mg 3.75 mg 3.75 mg    Description        Change warfarin starting today to 1 1/2 tablets every day

## 2015-06-27 ENCOUNTER — Other Ambulatory Visit: Payer: Medicare Other

## 2015-06-27 DIAGNOSIS — E119 Type 2 diabetes mellitus without complications: Secondary | ICD-10-CM | POA: Diagnosis not present

## 2015-06-27 DIAGNOSIS — E559 Vitamin D deficiency, unspecified: Secondary | ICD-10-CM | POA: Diagnosis not present

## 2015-06-27 DIAGNOSIS — E785 Hyperlipidemia, unspecified: Secondary | ICD-10-CM | POA: Diagnosis not present

## 2015-06-27 DIAGNOSIS — I482 Chronic atrial fibrillation, unspecified: Secondary | ICD-10-CM

## 2015-06-27 DIAGNOSIS — I10 Essential (primary) hypertension: Secondary | ICD-10-CM

## 2015-06-27 LAB — BAYER DCA HB A1C WAIVED: HB A1C (BAYER DCA - WAIVED): 8.3 % — ABNORMAL HIGH (ref <7.0)

## 2015-06-28 LAB — CBC WITH DIFFERENTIAL/PLATELET
BASOS ABS: 0 10*3/uL (ref 0.0–0.2)
BASOS: 0 %
EOS (ABSOLUTE): 0.1 10*3/uL (ref 0.0–0.4)
EOS: 1 %
HEMATOCRIT: 44.5 % (ref 37.5–51.0)
HEMOGLOBIN: 15.2 g/dL (ref 12.6–17.7)
IMMATURE GRANS (ABS): 0 10*3/uL (ref 0.0–0.1)
Immature Granulocytes: 0 %
LYMPHS ABS: 1.2 10*3/uL (ref 0.7–3.1)
LYMPHS: 19 %
MCH: 30.5 pg (ref 26.6–33.0)
MCHC: 34.2 g/dL (ref 31.5–35.7)
MCV: 89 fL (ref 79–97)
MONOCYTES: 6 %
Monocytes Absolute: 0.4 10*3/uL (ref 0.1–0.9)
NEUTROS ABS: 4.8 10*3/uL (ref 1.4–7.0)
Neutrophils: 74 %
Platelets: 209 10*3/uL (ref 150–379)
RBC: 4.98 x10E6/uL (ref 4.14–5.80)
RDW: 13.8 % (ref 12.3–15.4)
WBC: 6.5 10*3/uL (ref 3.4–10.8)

## 2015-06-28 LAB — BMP8+EGFR
BUN / CREAT RATIO: 17 (ref 10–24)
BUN: 17 mg/dL (ref 8–27)
CALCIUM: 9.9 mg/dL (ref 8.6–10.2)
CHLORIDE: 101 mmol/L (ref 96–106)
CO2: 25 mmol/L (ref 18–29)
CREATININE: 0.98 mg/dL (ref 0.76–1.27)
GFR, EST AFRICAN AMERICAN: 94 mL/min/{1.73_m2} (ref >59)
GFR, EST NON AFRICAN AMERICAN: 81 mL/min/{1.73_m2} (ref >59)
Glucose: 74 mg/dL (ref 65–99)
Potassium: 4.4 mmol/L (ref 3.5–5.2)
Sodium: 144 mmol/L (ref 134–144)

## 2015-06-28 LAB — HEPATIC FUNCTION PANEL
ALK PHOS: 54 IU/L (ref 39–117)
ALT: 19 IU/L (ref 0–44)
AST: 21 IU/L (ref 0–40)
Albumin: 4.4 g/dL (ref 3.6–4.8)
BILIRUBIN, DIRECT: 0.16 mg/dL (ref 0.00–0.40)
Bilirubin Total: 0.6 mg/dL (ref 0.0–1.2)
TOTAL PROTEIN: 6.6 g/dL (ref 6.0–8.5)

## 2015-06-28 LAB — NMR, LIPOPROFILE
CHOLESTEROL: 172 mg/dL (ref 100–199)
HDL Cholesterol by NMR: 56 mg/dL (ref >39)
HDL PARTICLE NUMBER: 31 umol/L (ref >=30.5)
LDL PARTICLE NUMBER: 1144 nmol/L — AB (ref <1000)
LDL SIZE: 20.9 nm (ref >20.5)
LDL-C: 105 mg/dL — ABNORMAL HIGH (ref 0–99)
LP-IR SCORE: 32 (ref <=45)
Small LDL Particle Number: 303 nmol/L (ref <=527)
Triglycerides by NMR: 57 mg/dL (ref 0–149)

## 2015-06-28 LAB — VITAMIN D 25 HYDROXY (VIT D DEFICIENCY, FRACTURES): VIT D 25 HYDROXY: 37.1 ng/mL (ref 30.0–100.0)

## 2015-07-04 ENCOUNTER — Ambulatory Visit (INDEPENDENT_AMBULATORY_CARE_PROVIDER_SITE_OTHER): Payer: Medicare Other | Admitting: Family Medicine

## 2015-07-04 ENCOUNTER — Telehealth: Payer: Self-pay | Admitting: Family Medicine

## 2015-07-04 ENCOUNTER — Encounter: Payer: Self-pay | Admitting: Family Medicine

## 2015-07-04 VITALS — BP 92/62 | HR 62 | Temp 97.1°F | Ht 71.0 in | Wt 153.0 lb

## 2015-07-04 DIAGNOSIS — N400 Enlarged prostate without lower urinary tract symptoms: Secondary | ICD-10-CM | POA: Diagnosis not present

## 2015-07-04 DIAGNOSIS — K648 Other hemorrhoids: Secondary | ICD-10-CM | POA: Diagnosis not present

## 2015-07-04 DIAGNOSIS — I482 Chronic atrial fibrillation, unspecified: Secondary | ICD-10-CM

## 2015-07-04 DIAGNOSIS — E559 Vitamin D deficiency, unspecified: Secondary | ICD-10-CM

## 2015-07-04 DIAGNOSIS — Z1211 Encounter for screening for malignant neoplasm of colon: Secondary | ICD-10-CM

## 2015-07-04 DIAGNOSIS — K644 Residual hemorrhoidal skin tags: Secondary | ICD-10-CM

## 2015-07-04 DIAGNOSIS — E785 Hyperlipidemia, unspecified: Secondary | ICD-10-CM

## 2015-07-04 DIAGNOSIS — M40204 Unspecified kyphosis, thoracic region: Secondary | ICD-10-CM

## 2015-07-04 DIAGNOSIS — E119 Type 2 diabetes mellitus without complications: Secondary | ICD-10-CM | POA: Diagnosis not present

## 2015-07-04 DIAGNOSIS — I10 Essential (primary) hypertension: Secondary | ICD-10-CM

## 2015-07-04 LAB — MICROSCOPIC EXAMINATION
RBC MICROSCOPIC, UA: NONE SEEN /HPF (ref 0–?)
WBC UA: NONE SEEN /HPF (ref 0–?)

## 2015-07-04 LAB — URINALYSIS, COMPLETE
Bilirubin, UA: NEGATIVE
Glucose, UA: NEGATIVE
Ketones, UA: NEGATIVE
LEUKOCYTES UA: NEGATIVE
NITRITE UA: NEGATIVE
PH UA: 5 (ref 5.0–7.5)
Protein, UA: NEGATIVE
RBC, UA: NEGATIVE
Specific Gravity, UA: >1.03 — ABNORMAL HIGH (ref 1.005–1.030)
Urobilinogen, Ur: 0.2 mg/dL (ref 0.2–1.0)

## 2015-07-04 NOTE — Progress Notes (Signed)
Subjective:    [name]: Christopher Christopher Avery, male    DOB: [birth year], 65 y.o.   MRN: [id number]  HPI  Pt here for follow up and management of chronic medical problems which includes diabetes and hyperlipidemia. He is taking medications regularly.The patient is doing well overall. He has had recent blood work done and the hemoglobin A1c was 8.3%. He is had fasting blood sugars that are excellent. He also has a total LDL particle number that is slightly increased from previous. He's been taking his medicines regularly. He will get a PSA today and a rectal exam today and we will review his lab work with him during office visit. He will also be given an FOBT to return. The patient denies any chest pain chest tightness or shortness of breath with exertion. He has no problems with swallowing heartburn indigestion nausea vomiting diarrhea or blood in the stool. His bowel habits are unchanged. He is not remember the name of the last gastroenterologist but his last colonoscopy was in January 2010. He's passing his water okay without any burning pain or frequency and case he has nocturia 2. He keeps up his eye exams regularly. He has not had a stress test and a good while and we will schedule him to Christopher Avery a cardiologist that he is seen in the past for possible stress test because of his history of diabetes.      Patient Active Problem List   Diagnosis Date Noted  . Incisional hernia, without obstruction or gangrene 11/29/2013  . Vitamin D deficiency 11/29/2013  . Conjunctival abrasion 09/14/2012  . Foreign body of left eye 09/13/2012  . Corneal abrasion 09/13/2012  . Type 2 diabetes mellitus treated with insulin (Christopher Christopher Avery) 07/29/2012  . Long term (current) use of anticoagulants 04/22/2012  . Hyperlipidemia 06/23/2008  . Benign essential HTN 06/23/2008  . Paroxysmal atrial fibrillation (Christopher Christopher Avery) 06/23/2008  . Asbestosis (Christopher Christopher Avery) 06/23/2008  . KIDNEY DISEASE 06/23/2008   Outpatient Encounter Prescriptions as of  07/04/2015  Medication Sig  . atorvastatin (LIPITOR) 10 MG tablet Take 5 mg by mouth daily.    [name] CINNAMON PO Take 1 capsule by mouth at bedtime.   [name] co-enzyme Q-10 30 MG capsule Take 100 mg by mouth daily.   [name] diltiazem (CARDIZEM CD) 180 MG 24 hr capsule Take 1 capsule (180 mg total) by mouth daily.  [name] glimepiride (AMARYL) 2 MG tablet Take 0.5 tablets (1 mg total) by mouth daily before supper.  [name] glimepiride (AMARYL) 4 MG tablet TAKE 1 TABLET (4 MG TOTAL) BY MOUTH DAILY BEFORE BREAKFAST.  [name] insulin aspart (NOVOLOG FLEXPEN) 100 UNIT/ML FlexPen Inject 2 Units into the skin 2 (two) times daily with a meal. Use according to sliding scale given in office  . Insulin Detemir (LEVEMIR FLEXTOUCH) 100 UNIT/ML Pen Inject 20 Units into the skin daily.  [name] oxyCODONE (OXY IR/ROXICODONE) 5 MG immediate release tablet Take 1-2 tablets (5-10 mg total) by mouth every 4 (four) hours as needed for moderate pain, severe pain or breakthrough pain.  . sildenafil (REVATIO) 20 MG tablet TAKE 2-5 TABLETS AS NEEDED PRIOR TO SEXUAL ACTIVITY  . sotalol (BETAPACE) 80 MG tablet Take 1 tablet (80 mg total) by mouth 2 (two) times daily. <PLEASE MAKE APPOINTMENT FOR REFILLS>  . valACYclovir (VALTREX) 500 MG tablet TAKE 4 TABLETS BY MOUTH TWICE DAILY AS DIRECTED AND AS NEEDED  . warfarin (COUMADIN) 2.5 MG tablet TAKE 1 TO 1 & 1/2 TABLETS BY MOUTH DAILY AS DIRECTED.   No  facility-administered encounter medications on file as of 07/04/2015.     Review of Systems  Constitutional: Negative.   HENT: Negative.   Eyes: Negative.   Respiratory: Negative.   Cardiovascular: Negative.   Gastrointestinal: Negative.   Endocrine: Negative.   Genitourinary: Negative.   Musculoskeletal: Negative.   Skin: Negative.   Allergic/Immunologic: Negative.   Neurological: Negative.   Hematological: Negative.   Psychiatric/Behavioral: Negative.        Objective:   Physical Exam  Constitutional: He is oriented to person, place, and time. He  appears well-developed and well-nourished.  HENT:  Head: Normocephalic and atraumatic.  Right Ear: External ear normal.  Left Ear: External ear normal.  Nose: Nose normal.  Mouth/Throat: Oropharynx is clear and moist. No oropharyngeal exudate.  Eyes: Conjunctivae and EOM are normal. Pupils are equal, round, and reactive to light. Right eye exhibits no discharge. Left eye exhibits no discharge. No scleral icterus.  Neck: Normal range of motion. Neck supple. No thyromegaly present.  No carotid bruits or anterior cervical adenopathy  Cardiovascular: Normal rate, normal heart sounds and intact distal pulses.   No murmur heard. Heart is irregular irregular at 60/m  Pulmonary/Chest: Effort normal and breath sounds normal. No respiratory distress. He has no wheezes. He has no rales. He exhibits no tenderness.  No axillary adenopathy and good breath sounds anteriorly and posteriorly. The patient gets annual chest x-rays because of his history of asbestosis exposure with one of the Christopher Christopher Avery pulmonologist. He also gets breathing test at the same time.  Abdominal: Soft. Bowel sounds are normal. He exhibits no mass. There is no tenderness. There is no rebound and no guarding.  No abdominal bruits organ enlargement or masses  Genitourinary: Rectum normal and penis normal.  The prostate is enlarged but soft and smooth. There are no rectal masses. There are no inguinal hernias palpable. There are no inguinal nodes. The external genitalia were within normal limits. The patient does have some external hemorrhoids.  Musculoskeletal: Normal range of motion. He exhibits no edema or tenderness.  He has some thoracic kyphosis.  Lymphadenopathy:    He has no cervical adenopathy.  Neurological: He is alert and oriented to person, place, and time. He has normal reflexes. No cranial nerve deficit.  He is had left knee replacement.  Skin: Skin is warm and dry. No rash noted.  Psychiatric: He has a normal mood and  affect. His behavior is normal. Judgment and thought content normal.  Nursing note and vitals reviewed.  BP 92/62 mmHg  Pulse 62  Temp(Src) 97.1 F (36.2 C) (Oral)  Ht 5\' 11"  (1.803 m)  Wt 153 lb (69.4 kg)  BMI 21.35 kg/m2        Assessment & Plan:   1. Type 2 diabetes mellitus not at goal [facility] Avery) -Record blood sugar readings more frequently over the next couple weeks and bring these readings to your visit with the clinical pharmacist to Christopher Avery if we can figure out why the A1c has been running higher than the blood sugars that you are getting records. -Continue with aggressive therapeutic lifestyle changes  2. Hyperlipidemia -Continue with current treatment and aggressive therapeutic lifestyle changes  3. Benign essential HTN -The blood pressure is good today and he'll be no change in treatment  4. Vitamin D deficiency -Start vitamin D3 1000 units 1 daily  5. BPH (benign prostatic hyperplasia) -The prostate is enlarged but the patient is having no symptoms with this other than occasional nocturia. - PSA, total and free -  Urinalysis, Complete  6. Special screening for malignant neoplasms, colon - Fecal occult blood, imunochemical; Future  7. Kyphosis of thoracic region, unspecified kyphosis type -Try to maintain good posture and make every effort not to put yourself at risk for falling and avoid climbing.  8. Chronic atrial fibrillation ([place]) -A follow-up visit will be scheduled with the cardiologist, Dr. Gwenlyn Found.  9. External hemorrhoids -The patient is currently having no problems with his hemorrhoids.  No orders of the defined types were placed in this encounter.   Patient Instructions  Continue current medications. Continue good therapeutic lifestyle changes which include good diet and exercise. Fall precautions discussed with patient. If an FOBT was given today- please return it to our front desk. If you are over 60 years old - you may need Prevnar 51 or the adult  Pneumonia vaccine.  After your visit with [place] today you will receive a survey in the mail or online from [employer] regarding your care with [place]. Please take a moment to fill this out. Your feedback is very important to [place] as you can help [place] better understand your patient needs as well as improve your experience and satisfaction. WE CARE ABOUT YOU!!!   Bring blood sugar readings in and check them more frequently to visit with the clinical pharmacist at her next pro time visit Drink plenty of fluids this summer and stay active Continue to watch diet closely We will arrange for you to have a visit with the cardiologist because of your history of diabetes and he can decide if he needs to do a stress test or not at that time. Continue to follow-up regularly with the ophthalmologist and make sure this and as a copy of your eye exam visit   Arrie Senate MD

## 2015-07-04 NOTE — Telephone Encounter (Signed)
Patient aware of results.

## 2015-07-04 NOTE — Patient Instructions (Addendum)
Continue current medications. Continue good therapeutic lifestyle changes which include good diet and exercise. Fall precautions discussed with patient. If an FOBT was given today- please return it to our front desk. If you are over 64 years old - you may need Prevnar 10 or the adult Pneumonia vaccine.  After your visit with [place] today you will receive a survey in the mail or online from [employer] regarding your care with [place]. Please take a moment to fill this out. Your feedback is very important to [place] as you can help [place] better understand your patient needs as well as improve your experience and satisfaction. WE CARE ABOUT YOU!!!   Bring blood sugar readings in and check them more frequently to visit with the clinical pharmacist at her next pro time visit Drink plenty of fluids this summer and stay active Continue to watch diet closely We will arrange for you to have a visit with the cardiologist because of your history of diabetes and he can decide if he needs to do a stress test or not at that time. Continue to follow-up regularly with the ophthalmologist and make sure this and as a copy of your eye exam visit

## 2015-07-05 LAB — PSA, TOTAL AND FREE
PROSTATE SPECIFIC AG, SERUM: 0.7 ng/mL (ref 0.0–4.0)
PSA, Free Pct: 22.9 %
PSA, Free: 0.16 ng/mL

## 2015-07-11 ENCOUNTER — Telehealth: Payer: Self-pay | Admitting: Cardiovascular Disease

## 2015-07-11 NOTE — Telephone Encounter (Signed)
Closed encounter °

## 2015-07-17 ENCOUNTER — Ambulatory Visit: Payer: Self-pay | Admitting: Cardiovascular Disease

## 2015-07-26 ENCOUNTER — Encounter (HOSPITAL_COMMUNITY): Payer: Self-pay | Admitting: Emergency Medicine

## 2015-07-26 ENCOUNTER — Ambulatory Visit (INDEPENDENT_AMBULATORY_CARE_PROVIDER_SITE_OTHER): Payer: Medicare Other | Admitting: Physician Assistant

## 2015-07-26 ENCOUNTER — Encounter: Payer: Self-pay | Admitting: Physician Assistant

## 2015-07-26 ENCOUNTER — Emergency Department (HOSPITAL_COMMUNITY)
Admission: EM | Admit: 2015-07-26 | Discharge: 2015-07-26 | Disposition: A | Discharge status: Home / Self Care | Payer: Medicare Other | Attending: Emergency Medicine | Admitting: Emergency Medicine

## 2015-07-26 ENCOUNTER — Ambulatory Visit: Payer: Self-pay | Admitting: Cardiovascular Disease

## 2015-07-26 DIAGNOSIS — Z1211 Encounter for screening for malignant neoplasm of colon: Secondary | ICD-10-CM | POA: Diagnosis not present

## 2015-07-26 DIAGNOSIS — Z7901 Long term (current) use of anticoagulants: Secondary | ICD-10-CM | POA: Insufficient documentation

## 2015-07-26 DIAGNOSIS — E162 Hypoglycemia, unspecified: Secondary | ICD-10-CM

## 2015-07-26 DIAGNOSIS — E11649 Type 2 diabetes mellitus with hypoglycemia without coma: Secondary | ICD-10-CM | POA: Insufficient documentation

## 2015-07-26 DIAGNOSIS — Z96652 Presence of left artificial knee joint: Secondary | ICD-10-CM | POA: Insufficient documentation

## 2015-07-26 DIAGNOSIS — Z7984 Long term (current) use of oral hypoglycemic drugs: Secondary | ICD-10-CM | POA: Insufficient documentation

## 2015-07-26 DIAGNOSIS — Z79899 Other long term (current) drug therapy: Secondary | ICD-10-CM | POA: Insufficient documentation

## 2015-07-26 DIAGNOSIS — Z85820 Personal history of malignant melanoma of skin: Secondary | ICD-10-CM | POA: Insufficient documentation

## 2015-07-26 DIAGNOSIS — R001 Bradycardia, unspecified: Secondary | ICD-10-CM | POA: Diagnosis not present

## 2015-07-26 DIAGNOSIS — R569 Unspecified convulsions: Secondary | ICD-10-CM | POA: Diagnosis not present

## 2015-07-26 DIAGNOSIS — I10 Essential (primary) hypertension: Secondary | ICD-10-CM | POA: Diagnosis not present

## 2015-07-26 DIAGNOSIS — Z794 Long term (current) use of insulin: Secondary | ICD-10-CM | POA: Diagnosis not present

## 2015-07-26 DIAGNOSIS — R404 Transient alteration of awareness: Secondary | ICD-10-CM | POA: Diagnosis not present

## 2015-07-26 LAB — CBG MONITORING, ED
GLUCOSE-CAPILLARY: 99 mg/dL (ref 65–99)
GLUCOSE-CAPILLARY: 157 mg/dL — AB (ref 65–99)
GLUCOSE-CAPILLARY: 244 mg/dL — AB (ref 65–99)
Glucose-Capillary: 96 mg/dL (ref 65–99)

## 2015-07-26 LAB — COMPREHENSIVE METABOLIC PANEL
ALT: 27 U/L (ref 17–63)
ANION GAP: 6 (ref 5–15)
AST: 36 U/L (ref 15–41)
Albumin: 4.5 g/dL (ref 3.5–5.0)
Alkaline Phosphatase: 43 U/L (ref 38–126)
BUN: 27 mg/dL — ABNORMAL HIGH (ref 6–20)
CHLORIDE: 111 mmol/L (ref 101–111)
CO2: 26 mmol/L (ref 22–32)
Calcium: 9.2 mg/dL (ref 8.9–10.3)
Creatinine, Ser: 1.09 mg/dL (ref 0.61–1.24)
Glucose, Bld: 66 mg/dL (ref 65–99)
POTASSIUM: 3.5 mmol/L (ref 3.5–5.1)
SODIUM: 143 mmol/L (ref 135–145)
Total Bilirubin: 0.9 mg/dL (ref 0.3–1.2)
Total Protein: 6.9 g/dL (ref 6.5–8.1)

## 2015-07-26 LAB — CBC WITH DIFFERENTIAL/PLATELET
BASOS ABS: 0 10*3/uL (ref 0.0–0.1)
Basophils Relative: 0 %
Eosinophils Absolute: 0.1 10*3/uL (ref 0.0–0.7)
Eosinophils Relative: 1 %
HEMATOCRIT: 40 % (ref 39.0–52.0)
Hemoglobin: 13.7 g/dL (ref 13.0–17.0)
LYMPHS ABS: 1.3 10*3/uL (ref 0.7–4.0)
LYMPHS PCT: 17 %
MCH: 30.5 pg (ref 26.0–34.0)
MCHC: 34.3 g/dL (ref 30.0–36.0)
MCV: 89.1 fL (ref 78.0–100.0)
MONO ABS: 0.4 10*3/uL (ref 0.1–1.0)
MONOS PCT: 6 %
NEUTROS ABS: 5.7 10*3/uL (ref 1.7–7.7)
Neutrophils Relative %: 76 %
PLATELETS: 171 10*3/uL (ref 150–400)
RBC: 4.49 MIL/uL (ref 4.22–5.81)
RDW: 12.8 % (ref 11.5–15.5)
WBC: 7.6 10*3/uL (ref 4.0–10.5)

## 2015-07-26 MED ORDER — ACETAMINOPHEN 500 MG PO TABS
1000.0000 mg | ORAL_TABLET | Freq: Once | ORAL | Status: AC
  Administered 2015-07-26: 1000 mg via ORAL
  Filled 2015-07-26: qty 2

## 2015-07-26 NOTE — ED Notes (Signed)
For the second time, Patient given 8 fl ounces of orange juice.

## 2015-07-26 NOTE — ED Notes (Signed)
Patient is complaining of left leg pain with cramps. Patient is on warfarin.
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
## 2015-07-26 NOTE — Progress Notes (Signed)
Subjective:     [name]: Christopher Avery, male   DOB: 04/28/1950, 65 y.o.   MRN: [id number]  HPI Pt here for f/u from ER discharge ~ 4 hrs ago Last night his wife noticed he was flinching a lot after coming to bed When she turned the lights on she noticed he was awake but unresponsive She immed called 911 BS at time of arrival was 29 Glucose was pushed and pt transported to the ER Fluids were given and pt improved He has been very good about following diet and monitoring his BS ~ 5 x/day Last pm he ate like usual but was more active working in the yard/vineyards until after dark He then took his 25U nightly dose ~ 10 pm Shortly after his wife noticed the change  Review of Systems  Constitutional: Negative.   HENT: Negative.   Respiratory: Negative.   Cardiovascular: Negative.   Gastrointestinal: Negative.    No real sx at time of appt just feeling drained    Objective:   Physical Exam  Constitutional: He appears well-developed and well-nourished.  Nursing note and vitals reviewed.      Assessment:     Hypoglycemia    Plan:     Would like for him to check BS before evening dosing  Dr Wendi Snipes has already decreased his qhs dosing down to 5U by phone earlier today Per pt request will set up referral to Endocrin for further eval/opinion Discussed may have to increase calories since he has increased activities F/U pending referral

## 2015-07-26 NOTE — ED Notes (Addendum)
Patient complains of seizures is why patient was called. Per EMS patient BS-28 patient was cool and clammy. Patient was shaking with eyes open. Patient was vomiting. Patient was given amp of d50 at the patients home. In route patient BS-35 and another amp of d50 was given. Patient BS-183.

## 2015-07-26 NOTE — ED Notes (Signed)
Patient was given 8 fluid ounce of orange juice.

## 2015-07-26 NOTE — Discharge Instructions (Signed)
We recommend that you lower your nightly dose of Levemir from 20 units to 5 units nightly. Continue taking your Novolog with meals, as prescribed. Keep a daily log of your morning glucose levels for the next few weeks. Call your primary care doctor office to discuss close follow-up either later today, or tomorrow. Continue checking her sugar regularly during the day. If you be in to feel lightheaded, clammy, or sweaty, it may be because your blood sugar is too low. Return to the ED for any persistent symptoms.  Hypoglycemia Hypoglycemia occurs when the glucose in your blood is too low. Glucose is a type of sugar that is your body's main energy source. Hormones, such as insulin and glucagon, control the level of glucose in the blood. Insulin lowers blood glucose and glucagon increases blood glucose. Having too much insulin in your blood stream, or not eating enough food containing sugar, can result in hypoglycemia. Hypoglycemia can happen to people with or without diabetes. It can develop quickly and can be a medical emergency.  CAUSES   Missing or delaying meals.  Not eating enough carbohydrates at meals.  Taking too much diabetes medicine.  Not timing your oral diabetes medicine or insulin doses with meals, snacks, and exercise.  Nausea and vomiting.  Certain medicines.  Severe illnesses, such as hepatitis, kidney disorders, and certain eating disorders.  Increased activity or exercise without eating something extra or adjusting medicines.  Drinking too much alcohol.  A nerve disorder that affects body functions like your heart rate, blood pressure, and digestion (autonomic neuropathy).  A condition where the stomach muscles do not function properly (gastroparesis). Therefore, medicines and food may not absorb properly.  Rarely, a tumor of the pancreas can produce too much insulin. SYMPTOMS   Hunger.  Sweating (diaphoresis).  Change in body  temperature.  Shakiness.  Headache.  Anxiety.  Lightheadedness.  Irritability.  Difficulty concentrating.  Dry mouth.  Tingling or numbness in the hands or feet.  Restless sleep or sleep disturbances.  Altered speech and coordination.  Change in mental status.  Seizures or prolonged convulsions.  Combativeness.  Drowsiness (lethargic).  Weakness.  Increased heart rate or palpitations.  Confusion.  Pale, gray skin color.  Blurred or double vision.  Fainting. DIAGNOSIS  A physical exam and medical history will be performed. Your caregiver may make a diagnosis based on your symptoms. Blood tests and other lab tests may be performed to confirm a diagnosis. Once the diagnosis is made, your caregiver will see if your signs and symptoms go away once your blood glucose is raised.  TREATMENT  Usually, you can easily treat your hypoglycemia when you notice symptoms.  Check your blood glucose. If it is less than 70 mg/dl, take one of the following:   3-4 glucose tablets.    cup juice.    cup regular soda.   1 cup skim milk.   -1 tube of glucose gel.   5-6 hard candies.   Avoid high-fat drinks or food that may delay a rise in blood glucose levels.  Do not take more than the recommended amount of sugary foods, drinks, gel, or tablets. Doing so will cause your blood glucose to go too high.   Wait 10-15 minutes and recheck your blood glucose. If it is still less than 70 mg/dl or below your target range, repeat treatment.   Eat a snack if it is more than 1 hour until your next meal.  There may be a time when your blood glucose  may go so low that you are unable to treat yourself at home when you start to notice symptoms. You may need someone to help you. You may even faint or be unable to swallow. If you cannot treat yourself, someone will need to bring you to the hospital.  [place]  If you have diabetes, follow your diabetes management  plan by:  Taking your medicines as directed.  Following your exercise plan.  Following your meal plan. Do not skip meals. Eat on time.  Testing your blood glucose regularly. Check your blood glucose before and after exercise. If you exercise longer or different than usual, be sure to check blood glucose more frequently.  Wearing your medical alert jewelry that says you have diabetes.  Identify the cause of your hypoglycemia. Then, develop ways to prevent the recurrence of hypoglycemia.  Do not take a hot bath or shower right after an insulin shot.  Always carry treatment with you. Glucose tablets are the easiest to carry.  If you are going to drink alcohol, drink it only with meals.  Tell friends or family members ways to keep you safe during a seizure. This may include removing hard or sharp objects from the area or turning you on your side.  Maintain a healthy weight. SEEK MEDICAL CARE IF:   You are having problems keeping your blood glucose in your target range.  You are having frequent episodes of hypoglycemia.  You feel you might be having side effects from your medicines.  You are not sure why your blood glucose is dropping so low.  You notice a change in vision or a new problem with your vision. SEEK IMMEDIATE MEDICAL CARE IF:   Confusion develops.  A change in mental status occurs.  The inability to swallow develops.  Fainting occurs.   This information is not intended to replace advice given to you by your health care provider. Make sure you discuss any questions you have with your health care provider.   Document Released: 01/20/2005 Document Revised: 01/25/2013 Document Reviewed: 09/26/2014 Elsevier Interactive Patient Education [employer].

## 2015-07-26 NOTE — ED Notes (Signed)
Bed: WA15 Expected date:  Expected time:  Means of arrival:  Comments: Hypoglycemia 

## 2015-07-26 NOTE — Patient Instructions (Signed)

## 2015-07-27 LAB — FECAL OCCULT BLOOD, IMMUNOCHEMICAL: Fecal Occult Bld: NEGATIVE

## 2015-07-30 NOTE — Progress Notes (Signed)
Patient aware.

## 2015-08-06 DIAGNOSIS — E7800 Pure hypercholesterolemia, unspecified: Secondary | ICD-10-CM | POA: Diagnosis not present

## 2015-08-06 DIAGNOSIS — I10 Essential (primary) hypertension: Secondary | ICD-10-CM | POA: Diagnosis not present

## 2015-08-06 DIAGNOSIS — E1165 Type 2 diabetes mellitus with hyperglycemia: Secondary | ICD-10-CM | POA: Diagnosis not present

## 2015-08-06 DIAGNOSIS — E162 Hypoglycemia, unspecified: Secondary | ICD-10-CM | POA: Diagnosis not present

## 2015-08-06 DIAGNOSIS — R634 Abnormal weight loss: Secondary | ICD-10-CM | POA: Diagnosis not present

## 2015-08-13 DIAGNOSIS — E1165 Type 2 diabetes mellitus with hyperglycemia: Secondary | ICD-10-CM | POA: Diagnosis not present

## 2015-08-14 ENCOUNTER — Other Ambulatory Visit: Payer: Self-pay

## 2015-08-14 MED ORDER — INSULIN ASPART 100 UNIT/ML FLEXPEN: 2.0000 [IU] | PEN_INJECTOR | Freq: Two times a day (BID) | SUBCUTANEOUS | Status: DC

## 2015-08-21 ENCOUNTER — Other Ambulatory Visit: Payer: Self-pay | Admitting: Pharmacist

## 2015-08-21 ENCOUNTER — Ambulatory Visit (INDEPENDENT_AMBULATORY_CARE_PROVIDER_SITE_OTHER): Payer: Medicare Other | Admitting: Pharmacist

## 2015-08-21 DIAGNOSIS — I480 Paroxysmal atrial fibrillation: Secondary | ICD-10-CM | POA: Diagnosis not present

## 2015-08-21 LAB — COAGUCHEK XS/INR WAIVED
INR: 1.8 — ABNORMAL HIGH (ref 0.9–1.1)
Prothrombin Time: 21.4 s

## 2015-08-21 MED ORDER — GLUCAGON (RDNA) 1 MG IJ KIT: PACK | INTRAMUSCULAR | Status: DC

## 2015-08-21 NOTE — Patient Instructions (Signed)
Anticoagulation Dose Instructions as of 08/21/2015      Dorene Grebe Tue Wed Thu Fri Sat   New Dose 3.75 mg 3.75 mg 3.75 mg 3.75 mg 3.75 mg 3.75 mg 3.75 mg    Description        Take 2 tablets today (= 5mg ) then continue current dose of warfarin 2.5mg  - take 1 and 1/2 tablets each day.     INR was 1.8 today

## 2015-09-13 ENCOUNTER — Other Ambulatory Visit: Payer: Self-pay | Admitting: Family Medicine

## 2015-09-17 ENCOUNTER — Ambulatory Visit: Payer: Self-pay | Admitting: Nutrition

## 2015-09-21 ENCOUNTER — Ambulatory Visit (INDEPENDENT_AMBULATORY_CARE_PROVIDER_SITE_OTHER): Payer: Medicare Other | Admitting: Cardiovascular Disease

## 2015-09-21 ENCOUNTER — Encounter: Payer: Self-pay | Admitting: Cardiovascular Disease

## 2015-09-21 VITALS — BP 114/72 | HR 65 | Ht 71.0 in | Wt 149.0 lb

## 2015-09-21 DIAGNOSIS — I482 Chronic atrial fibrillation, unspecified: Secondary | ICD-10-CM

## 2015-09-21 DIAGNOSIS — E785 Hyperlipidemia, unspecified: Secondary | ICD-10-CM

## 2015-09-21 DIAGNOSIS — I10 Essential (primary) hypertension: Secondary | ICD-10-CM | POA: Diagnosis not present

## 2015-09-21 DIAGNOSIS — I480 Paroxysmal atrial fibrillation: Secondary | ICD-10-CM

## 2015-09-21 DIAGNOSIS — Z79899 Other long term (current) drug therapy: Secondary | ICD-10-CM

## 2015-09-21 NOTE — Assessment & Plan Note (Signed)
History of paroxysmal atrial fibrillation currently in A. fib with controlled ventricular response on sotalol and Coumadin. I discussed with him the possibility of transitioning to a novel oral anticoagulants.

## 2015-09-21 NOTE — Patient Instructions (Signed)
Medication Instructions:  Your physician recommends that you continue on your current medications as directed. Please refer to the Current Medication list given to you today.   Labwork: Your physician recommends that you return for lab work AT [facility]. You will need to be fasting. Do not eat or drink past midnight.   Follow-Up: You have been referred to OUR PHARMACY FOR EVALUATION OF MEDICATIONS TO SWITCH TO FOR ANTICOAGULATION MANAGEMENT.   Your physician wants you to follow-up in: [place]. You will receive a reminder letter in the mail two months in advance. If you don't receive a letter, please call our office to schedule the follow-up appointment.  If you need a refill on your cardiac medications before your next appointment, please call your pharmacy.

## 2015-09-21 NOTE — Assessment & Plan Note (Signed)
History of hyperlipidemia on low-dose Lipitor with recent lipid profile performed 06/27/15 revealed total cholesterol 172. This is higher than previous readings because he had not been taking his Lipitor due to the concern that this may have contributed to his diabetes. He is back on his statin drug. We will recheck a lipid and liver profile

## 2015-09-21 NOTE — Progress Notes (Signed)
09/21/2015 Christopher Avery   1950/06/03  [id number]  Primary Physician Redge Gainer, MD Primary Cardiologist: Lorretta Harp MD Renae Gloss  HPI:  The patient is a delightful 65 year old thin and fit-appearing married Caucasian male, father of 68, grandfather of 4 grandchildren, who I saw 03/14/14. He has a history of paroxysmal atrial fibrillation, maintaining sinus rhythm on Coumadin anticoagulation and sotalol. His other problems include hypertension, hyperlipidemia, and family history of heart disease. He is totally asymptomatic. His last Myoview performed in our office, November 03, 2006, was nonischemic. Since I saw him a year ago he denies chest pain or shortness of breath.   Current Outpatient Prescriptions  Medication Sig Dispense Refill  . atorvastatin (LIPITOR) 10 MG tablet Take 5 mg by mouth daily.      [name] co-enzyme Q-10 30 MG capsule Take 100 mg by mouth daily.     [name] diltiazem (CARDIZEM CD) 180 MG 24 hr capsule Take 1 capsule (180 mg total) by mouth daily. 90 capsule 3  . glucagon (GLUCAGON EMERGENCY) 1 MG injection Inject as needed for hypoglycemia with unresponsiveness (family member to review instructions on how to administer) 1 each 0  . insulin aspart (NOVOLOG FLEXPEN) 100 UNIT/ML FlexPen Inject 2 Units into the skin 2 (two) times daily with a meal. Use according to sliding scale given in office 45 mL 0  . Insulin Detemir (LEVEMIR FLEXTOUCH) 100 UNIT/ML Pen Inject 20 Units into the skin daily. (Patient taking differently: Inject 22 Units into the skin daily. ) 45 mL 0  . oxyCODONE (OXY IR/ROXICODONE) 5 MG immediate release tablet Take 1-2 tablets (5-10 mg total) by mouth every 4 (four) hours as needed for moderate pain, severe pain or breakthrough pain. 40 tablet 0  . sildenafil (REVATIO) 20 MG tablet TAKE 2-5 TABLETS AS NEEDED PRIOR TO SEXUAL ACTIVITY 100 tablet 0  . sotalol (BETAPACE) 80 MG tablet Take 1 tablet (80 mg total) by mouth 2 (two) times daily.  <PLEASE MAKE APPOINTMENT FOR REFILLS> (Patient taking differently: Take 40 mg by mouth 2 (two) times daily. <PLEASE MAKE APPOINTMENT FOR REFILLS>) 60 tablet 0  . valACYclovir (VALTREX) 500 MG tablet TAKE 4 TABLETS BY MOUTH TWICE DAILY AS DIRECTED AND AS NEEDED 56 tablet 0  . warfarin (COUMADIN) 2.5 MG tablet TAKE 1 TO 1 & 1/2 TABLETS BY MOUTH DAILY AS DIRECTED. (Patient taking differently: TAKE 1 & 1/2 TABLETS BY MOUTH DAILY AS DIRECTED.) 135 tablet 1   No current facility-administered medications for this visit.     Allergies  Allergen Reactions  . Januvia [Sitagliptin] Hives    Social History   Social History  . Marital status: Married    Spouse name: [name]  . Number of children: 2  . Years of education: [name]   Occupational History  . Retired    Social History Main Topics  . Smoking status: Never Smoker  . Smokeless tobacco: Never Used  . Alcohol use 0.0 oz/week     Comment: wine 3-4 x a yr  . Drug use: No  . Sexual activity: Yes   Other Topics Concern  . Not on file   Social History Narrative  . No narrative on file     Review of Systems: General: negative for chills, fever, night sweats or weight changes.  Cardiovascular: negative for chest pain, dyspnea on exertion, edema, orthopnea, palpitations, paroxysmal nocturnal dyspnea or shortness of breath Dermatological: negative for rash Respiratory: negative for cough or wheezing Urologic: negative for  hematuria Abdominal: negative for nausea, vomiting, diarrhea, bright red blood per rectum, melena, or hematemesis Neurologic: negative for visual changes, syncope, or dizziness All other systems reviewed and are otherwise negative except as noted above.    Blood pressure 114/72, pulse 65, height 5\' 11"  (1.803 m), weight 149 lb (67.6 kg).  General appearance: alert and no distress Neck: no adenopathy, no carotid bruit, no JVD, supple, symmetrical, trachea midline and thyroid not enlarged, symmetric, no  tenderness/mass/nodules Lungs: clear to auscultation bilaterally Heart: irregularly irregular rhythm Extremities: extremities normal, atraumatic, no cyanosis or edema  EKG atrial fibrillation with a ventricular response of 65, septal Q waves and left axis deviation and I personally reviewed this EKG  ASSESSMENT AND PLAN:   Hyperlipidemia History of hyperlipidemia on low-dose Lipitor with recent lipid profile performed 06/27/15 revealed total cholesterol 172. This is higher than previous readings because he had not been taking his Lipitor due to the concern that this may have contributed to his diabetes. He is back on his statin drug. We will recheck a lipid and liver profile  Benign essential HTN History of hypertension blood pressure measures 114/72. He is on diltiazem. Continue current meds at current dosing.  Paroxysmal atrial fibrillation History of paroxysmal atrial fibrillation currently in A. fib with controlled ventricular response on sotalol and Coumadin. I discussed with him the possibility of transitioning to a novel oral anticoagulants.      Lorretta Harp MD FACP,FACC,FAHA, [facility] 09/21/2015 8:52 AM

## 2015-09-21 NOTE — Assessment & Plan Note (Signed)
History of hypertension blood pressure measures 114/72. He is on diltiazem. Continue current meds at current dosing.

## 2015-09-27 ENCOUNTER — Other Ambulatory Visit: Payer: Self-pay | Admitting: Pulmonary Disease

## 2015-09-27 DIAGNOSIS — R0600 Dyspnea, unspecified: Secondary | ICD-10-CM

## 2015-09-28 ENCOUNTER — Ambulatory Visit (INDEPENDENT_AMBULATORY_CARE_PROVIDER_SITE_OTHER)
Admission: RE | Admit: 2015-09-28 | Discharge: 2015-09-28 | Disposition: A | Discharge status: Home / Self Care | Payer: Worker's Compensation | Source: Ambulatory Visit | Attending: Pulmonary Disease | Admitting: Pulmonary Disease

## 2015-09-28 ENCOUNTER — Encounter: Payer: Self-pay | Admitting: Pulmonary Disease

## 2015-09-28 ENCOUNTER — Ambulatory Visit (INDEPENDENT_AMBULATORY_CARE_PROVIDER_SITE_OTHER): Payer: Worker's Compensation | Admitting: Pulmonary Disease

## 2015-09-28 ENCOUNTER — Encounter (INDEPENDENT_AMBULATORY_CARE_PROVIDER_SITE_OTHER): Payer: Worker's Compensation | Admitting: Pulmonary Disease

## 2015-09-28 ENCOUNTER — Ambulatory Visit (INDEPENDENT_AMBULATORY_CARE_PROVIDER_SITE_OTHER): Payer: Medicare Other | Admitting: Pharmacist Clinician (PhC)/ Clinical Pharmacy Specialist

## 2015-09-28 ENCOUNTER — Encounter: Payer: Self-pay | Admitting: Pharmacist Clinician (PhC)/ Clinical Pharmacy Specialist

## 2015-09-28 DIAGNOSIS — I482 Chronic atrial fibrillation, unspecified: Secondary | ICD-10-CM

## 2015-09-28 DIAGNOSIS — Z77090 Contact with and (suspected) exposure to asbestos: Secondary | ICD-10-CM

## 2015-09-28 DIAGNOSIS — R0600 Dyspnea, unspecified: Secondary | ICD-10-CM | POA: Diagnosis not present

## 2015-09-28 LAB — PULMONARY FUNCTION TEST
DL/VA % pred: 92 %
DL/VA: 4.31 ml/min/mmHg/L
DLCO COR % PRED: 91 %
DLCO COR: 30.73 ml/min/mmHg
DLCO UNC % PRED: 93 %
DLCO UNC: 31.4 ml/min/mmHg
FEF 25-75 Post: 4.33 L/sec
FEF 25-75 Pre: 4.78 L/sec
FEF2575-%Change-Post: -9 %
FEF2575-%Pred-Post: 152 %
FEF2575-%Pred-Pre: 168 %
FEV1-%CHANGE-POST: -1 %
FEV1-%PRED-POST: 112 %
FEV1-%Pred-Pre: 114 %
FEV1-POST: 4.02 L
FEV1-Pre: 4.1 L
FEV1FVC-%Change-Post: -6 %
FEV1FVC-%Pred-Pre: 116 %
FEV6-%Change-Post: 4 %
FEV6-%PRED-PRE: 102 %
FEV6-%Pred-Post: 107 %
FEV6-POST: 4.9 L
FEV6-Pre: 4.68 L
FEV6FVC-%Change-Post: 0 %
FEV6FVC-%PRED-POST: 105 %
FEV6FVC-%Pred-Pre: 105 %
FVC-%Change-Post: 4 %
FVC-%PRED-PRE: 97 %
FVC-%Pred-Post: 102 %
FVC-POST: 4.9 L
FVC-PRE: 4.68 L
POST FEV6/FVC RATIO: 100 %
PRE FEV1/FVC RATIO: 88 %
PRE FEV6/FVC RATIO: 100 %
Post FEV1/FVC ratio: 82 %

## 2015-09-28 LAB — COAGUCHEK XS/INR WAIVED
INR: 3.6 — ABNORMAL HIGH (ref 0.9–1.1)
Prothrombin Time: 42.7 s
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
## 2015-09-28 NOTE — Patient Instructions (Signed)
We will call you with the results of your chest x-ray We will see you back in one year with pulmonary function test Let us know if you have any breathing problems in the interim.

## 2015-09-28 NOTE — Assessment & Plan Note (Signed)
This is been a stable interval for Christopher Avery. His lung function testing today is normal, will get a chest x-ray out the door. There is no evidence of has best is related lung disease at this time. Plan will be for him to follow-up in one year with the same workup.

## 2015-09-28 NOTE — Patient Instructions (Addendum)
Anticoagulation Dose Instructions as of 09/28/2015      Christopher Avery Tue Wed Thu Fri Sat   New Dose 3.75 mg 3.75 mg 3.75 mg Hold 3.75 mg 3.75 mg 3.75 mg    Description   Skip dose today and tomorrow. Then continue current dose of warfarin 2.5mg  - take 1 and 1/2 tablets each day.  Starting next week hold Wednesday dose   Eat more Vitamin K foods during the week to help thicken the blood.  INR today was 3.6

## 2015-10-01 ENCOUNTER — Encounter: Payer: Medicare Other | Attending: Endocrinology | Admitting: Nutrition

## 2015-10-01 ENCOUNTER — Encounter: Payer: Self-pay | Admitting: Nutrition

## 2015-10-01 VITALS — Ht 72.0 in | Wt 149.8 lb

## 2015-10-01 DIAGNOSIS — E119 Type 2 diabetes mellitus without complications: Secondary | ICD-10-CM | POA: Insufficient documentation

## 2015-10-01 DIAGNOSIS — E1065 Type 1 diabetes mellitus with hyperglycemia: Secondary | ICD-10-CM

## 2015-10-01 DIAGNOSIS — IMO0002 Reserved for concepts with insufficient information to code with codable children: Secondary | ICD-10-CM

## 2015-10-01 DIAGNOSIS — E108 Type 1 diabetes mellitus with unspecified complications: Secondary | ICD-10-CM

## 2015-10-01 DIAGNOSIS — Z713 Dietary counseling and surveillance: Secondary | ICD-10-CM | POA: Insufficient documentation

## 2015-10-01 NOTE — Patient Instructions (Signed)
Goals 1. Follow My Plate 2. Gain 1-2 lbs per month 3. Eat 60 gram of carbs per mela. 4. Eat meals on time 5. Make sure BS is 100 before bed and before taking Levemir at night. Eat 30 grams of carbs with 1-2 oz of protein. 6. Get A1C 7%

## 2015-10-01 NOTE — Progress Notes (Signed)
  Medical Nutrition Therapy:  Appt start time: 0800 end time:  0900.  Assessment:  Primary concerns today: Diabetes. Type 1 ? Vs Type 2.  Sees Dr. Chalmers Cater and [name]. DM x 6 yrs. Has lost about 30+ lbs. [place]. [place]. 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 Physically very active. Here with his wife and grandson. On Levemir 15 units and Novolog 2 units with meals plus sliding scale. Eats three meals and 2-3 snacks of nuts or PB crackers or fruit if blood sugar is low. A1C was 8.9% and now down to 8.1%. Had a low blood sugar recently and had to call EMS with BS of 28 mg/d and seizure like activity per notes. Hasn't had any more issues after insulin was reduced.   Motivated to learn. Hasn't met with a CDE/RD before. Has been avoiding carbs mostly and eating more protein.   Diet is insuffient in calories and carbs.  Preferred Learning Style:  No preference indicated   Learning Readiness:   Ready  Change in progress   MEDICATIONS: See list   DIETARY INTAKE:   24-hr recall:  B ( AM): Eggs., bacon, sausage  And 2 slices 2 toast, Or Cherrios, coffee Snk ( AM): protein drink Premeir  L ( PM): CHinese misc: sandwich tukey on ww bread,  With protein,  Few chips, carrots or wheat thin crackers, water or diet coke Snk ( PM): cheese crackers, or peanuts D ( PM): bowl of pinto beans, sweet potato casserole, wate, hush puppies Snk ( PM):  Beverages: water, diet soda  Usual physical activity: works outside a lot on [employer] needs: 2000 calories 225 g carbohydrates 150 g protein 56 g fat  Progress Towards Goal(s):  In progress.   Nutritional Diagnosis:  NB-1.1 Food and nutrition-related knowledge deficit As related to Diabetes.  As evidenced by A1C 8.1%..    Intervention:  Nutrition and Diabetes education provided on My Plate, CHO counting, meal planning, portion sizes, timing of meals, avoiding snacks between meals unless having a low blood sugar, target ranges for A1C and  blood sugars, signs/symptoms and treatment of hyper/hypoglycemia, monitoring blood sugars, taking medications as prescribed, benefits of exercising 30 minutes per day and prevention of complications of DM.   Teaching Method Utilized:  Visual Auditory Hands on  Handouts given during visit include:  The Plate Method   Meal Plan Card  Diabetes Instructions.   Barriers to learning/adherence to lifestyle change:  None  Demonstrated degree of understanding via:  Teach Back   Monitoring/Evaluation:  Dietary intake, exercise, meal planning, SBG, and body weight in 1 month(s).

## 2015-10-03 ENCOUNTER — Ambulatory Visit: Payer: 59

## 2015-10-19 ENCOUNTER — Encounter: Payer: Self-pay | Admitting: Pharmacist Clinician (PhC)/ Clinical Pharmacy Specialist

## 2015-10-19 DIAGNOSIS — E109 Type 1 diabetes mellitus without complications: Secondary | ICD-10-CM | POA: Diagnosis not present

## 2015-10-19 LAB — HM DIABETES EYE EXAM

## 2015-10-22 ENCOUNTER — Ambulatory Visit (INDEPENDENT_AMBULATORY_CARE_PROVIDER_SITE_OTHER): Payer: Medicare Other | Admitting: Pharmacist

## 2015-10-22 DIAGNOSIS — I480 Paroxysmal atrial fibrillation: Secondary | ICD-10-CM

## 2015-10-22 DIAGNOSIS — Z7901 Long term (current) use of anticoagulants: Secondary | ICD-10-CM | POA: Diagnosis not present

## 2015-10-22 LAB — COAGUCHEK XS/INR WAIVED
INR: 2.6 — AB (ref 0.9–1.1)
PROTHROMBIN TIME: 31.4 s

## 2015-10-24 DIAGNOSIS — Z08 Encounter for follow-up examination after completed treatment for malignant neoplasm: Secondary | ICD-10-CM | POA: Diagnosis not present

## 2015-10-24 DIAGNOSIS — Z85820 Personal history of malignant melanoma of skin: Secondary | ICD-10-CM | POA: Diagnosis not present

## 2015-10-24 DIAGNOSIS — L570 Actinic keratosis: Secondary | ICD-10-CM | POA: Diagnosis not present

## 2015-10-24 DIAGNOSIS — X32XXXD Exposure to sunlight, subsequent encounter: Secondary | ICD-10-CM | POA: Diagnosis not present

## 2015-10-24 DIAGNOSIS — Z1283 Encounter for screening for malignant neoplasm of skin: Secondary | ICD-10-CM | POA: Diagnosis not present

## 2015-10-30 ENCOUNTER — Other Ambulatory Visit: Payer: Self-pay | Admitting: Family Medicine

## 2015-11-07 ENCOUNTER — Encounter: Payer: Self-pay | Admitting: Nutrition

## 2015-11-07 ENCOUNTER — Encounter: Payer: Medicare Other | Attending: Endocrinology | Admitting: Nutrition

## 2015-11-07 ENCOUNTER — Telehealth: Payer: Self-pay | Admitting: Nutrition

## 2015-11-07 VITALS — Ht 72.0 in | Wt 148.9 lb

## 2015-11-07 DIAGNOSIS — E119 Type 2 diabetes mellitus without complications: Secondary | ICD-10-CM | POA: Insufficient documentation

## 2015-11-07 DIAGNOSIS — E1165 Type 2 diabetes mellitus with hyperglycemia: Secondary | ICD-10-CM

## 2015-11-07 DIAGNOSIS — IMO0002 Reserved for concepts with insufficient information to code with codable children: Secondary | ICD-10-CM

## 2015-11-07 DIAGNOSIS — Z794 Long term (current) use of insulin: Secondary | ICD-10-CM

## 2015-11-07 DIAGNOSIS — E118 Type 2 diabetes mellitus with unspecified complications: Secondary | ICD-10-CM

## 2015-11-07 DIAGNOSIS — Z713 Dietary counseling and surveillance: Secondary | ICD-10-CM | POA: Diagnosis not present

## 2015-11-07 NOTE — Patient Instructions (Addendum)
Goals 1.Increase water 5-6 bottes of water per day 2. Continue to eat 60 gram of carbs per meal 3. Avoid diet sodas and sweetners. 4. Prevent low blood sugars. Increase low carb vegetables with meals Talk to Dr. Chalmers Cater about trying Basaglar for better BS coverage with a letter to insurance company.

## 2015-11-07 NOTE — Telephone Encounter (Signed)
TC to pt. After re-reviewing his BS logs, he has been taking 2 units of insulin when BS is less than 100. According to sliding scale he told me that his Endocrinologist wants him to start 2 units for 100-150 mg/dl and increase 1 units per every 50 pts above 150. Therefore BS less than 100 he shouldn't take any insuln but eat his meal. He verbalized understanding. Encouraged him to stick to sliding scale for better blood sugar control.

## 2015-11-07 NOTE — Progress Notes (Signed)
  Medical Nutrition Therapy:  Appt start time: 0800 end time:  0900.  Assessment:  Primary concerns today: Diabetes.   Type 1 ? Vs Type 2.  Trying to eating 60-70 grams of carbs per meal. Wt stable.  Drinks diet sodas more than water. Levermir 15 units and 2 units  Novolg with meals plus slding scale. BS avg 168 mg/dl. He is using Novolog pen longer than 30 days and so has been having poor Levemir coverage past 20 hours and with using  expired Novolog (past 30 days), that may explain up and down blood sugars, along with being not well hydrated.  Sliding scale coverage usage may need to be adjusted. He is giving himself Novolog when BS are less than 100 and that may be causing some low blood sugar and then over correction.   Still running high at times; before dinner or after dinner.   He will eat a snack of PB sandwich  before bed if BS is lower than 150 to prevent low BS in am. Only 1 low bs of 69 and didn't feel it. Gets plenty of exercise.   Preferred Learning Style:  No preference indicated   Learning Readiness:   Ready  Change in progress   MEDICATIONS: See list   DIETARY INTAKE:   24-hr recall:  B ( AM):Eggs and almond cluster or honey nut medley, 2% milk  Snk ( AM):  L ( PM): 2 slices pizza, turnip greens, Yogurt, cheese block, diet pepsi Snk ( PM):nuts, D ( PM): Jr bacon cheese burger and small fries, water Snk ( PM): 1/2 pb sandwich sicne BS was near 100 before bed. Beverages: water, diet soda  Usual physical activity: works outside a lot on [employer] needs: 2000 calories 225 g carbohydrates 150 g protein 56 g fat  Progress Towards Goal(s):  In progress.   Nutritional Diagnosis:  NB-1.1 Food and nutrition-related knowledge deficit As related to Diabetes.  As evidenced by A1C 8.1%..    Intervention:  Nutrition and Diabetes education provided on My Plate, CHO counting, meal planning, portion sizes, timing of meals, avoiding snacks between meals  unless having a low blood sugar, target ranges for A1C and blood sugars, signs/symptoms and treatment of hyper/hypoglycemia, monitoring blood sugars, taking medications as prescribed, benefits of exercising 30 minutes per day and prevention of complications of DM.    Goals 1.Increase water 5-6 bottes of water per day 2. Continue to eat 60 gram of carbs per meal 3. Avoid diet sodas and sweetners. 4. Prevent low blood sugars. Increase low carb vegetables with meals   Teaching Method Utilized:  Visual Auditory Hands on  Handouts given during visit include:  The Plate Method   Meal Plan Card  Diabetes Instructions.   Barriers to learning/adherence to lifestyle change:  None  Demonstrated degree of understanding via:  Teach Back   Monitoring/Evaluation:  Dietary intake, exercise, meal planning, SBG, and body weight in 3 month(s).  Recommend to consider trying Basaglar if insurance will cover it with prior authorization since he isn't getting good coverage for 24 hrs from Levemir since he is on a low dose of Levemir.   Would also recommend to not start Novolog sliding scale until 100 or 150 to prevent low blood sugars and or over correction.

## 2015-11-13 ENCOUNTER — Other Ambulatory Visit (INDEPENDENT_AMBULATORY_CARE_PROVIDER_SITE_OTHER): Payer: Medicare Other

## 2015-11-13 DIAGNOSIS — E784 Other hyperlipidemia: Secondary | ICD-10-CM | POA: Diagnosis not present

## 2015-11-13 DIAGNOSIS — I10 Essential (primary) hypertension: Secondary | ICD-10-CM | POA: Diagnosis not present

## 2015-11-13 DIAGNOSIS — E7849 Other hyperlipidemia: Secondary | ICD-10-CM

## 2015-11-13 DIAGNOSIS — E119 Type 2 diabetes mellitus without complications: Secondary | ICD-10-CM | POA: Diagnosis not present

## 2015-11-13 DIAGNOSIS — I480 Paroxysmal atrial fibrillation: Secondary | ICD-10-CM

## 2015-11-13 DIAGNOSIS — E559 Vitamin D deficiency, unspecified: Secondary | ICD-10-CM | POA: Diagnosis not present

## 2015-11-13 LAB — BAYER DCA HB A1C WAIVED: HB A1C (BAYER DCA - WAIVED): 8.5 % — ABNORMAL HIGH (ref <7.0)

## 2015-11-14 ENCOUNTER — Telehealth: Payer: Self-pay | Admitting: Family Medicine

## 2015-11-14 ENCOUNTER — Encounter: Payer: Self-pay | Admitting: Family Medicine

## 2015-11-14 ENCOUNTER — Ambulatory Visit (INDEPENDENT_AMBULATORY_CARE_PROVIDER_SITE_OTHER): Payer: Medicare Other | Admitting: Family Medicine

## 2015-11-14 VITALS — BP 127/71 | HR 58 | Temp 97.3°F | Ht 72.0 in | Wt 149.0 lb

## 2015-11-14 DIAGNOSIS — Z23 Encounter for immunization: Secondary | ICD-10-CM | POA: Diagnosis not present

## 2015-11-14 DIAGNOSIS — E119 Type 2 diabetes mellitus without complications: Secondary | ICD-10-CM | POA: Diagnosis not present

## 2015-11-14 DIAGNOSIS — I10 Essential (primary) hypertension: Secondary | ICD-10-CM | POA: Diagnosis not present

## 2015-11-14 DIAGNOSIS — I480 Paroxysmal atrial fibrillation: Secondary | ICD-10-CM | POA: Diagnosis not present

## 2015-11-14 DIAGNOSIS — C439 Malignant melanoma of skin, unspecified: Secondary | ICD-10-CM

## 2015-11-14 DIAGNOSIS — E559 Vitamin D deficiency, unspecified: Secondary | ICD-10-CM

## 2015-11-14 DIAGNOSIS — E784 Other hyperlipidemia: Secondary | ICD-10-CM

## 2015-11-14 DIAGNOSIS — E7849 Other hyperlipidemia: Secondary | ICD-10-CM

## 2015-11-14 LAB — BMP8+EGFR
BUN/Creatinine Ratio: 24 (ref 10–24)
BUN: 19 mg/dL (ref 8–27)
CALCIUM: 9.4 mg/dL (ref 8.6–10.2)
CHLORIDE: 99 mmol/L (ref 96–106)
CO2: 27 mmol/L (ref 18–29)
Creatinine, Ser: 0.78 mg/dL (ref 0.76–1.27)
GFR calc Af Amer: 110 mL/min/{1.73_m2} (ref >59)
GFR calc non Af Amer: 95 mL/min/{1.73_m2} (ref >59)
GLUCOSE: 140 mg/dL — AB (ref 65–99)
POTASSIUM: 4.2 mmol/L (ref 3.5–5.2)
Sodium: 139 mmol/L (ref 134–144)

## 2015-11-14 LAB — CBC WITH DIFFERENTIAL/PLATELET
BASOS ABS: 0 10*3/uL (ref 0.0–0.2)
Basos: 0 %
EOS (ABSOLUTE): 0.2 10*3/uL (ref 0.0–0.4)
Eos: 3 %
Hematocrit: 41.3 % (ref 37.5–51.0)
Hemoglobin: 14.2 g/dL (ref 12.6–17.7)
IMMATURE GRANS (ABS): 0 10*3/uL (ref 0.0–0.1)
IMMATURE GRANULOCYTES: 0 %
LYMPHS: 35 %
Lymphocytes Absolute: 1.8 10*3/uL (ref 0.7–3.1)
MCH: 30.4 pg (ref 26.6–33.0)
MCHC: 34.4 g/dL (ref 31.5–35.7)
MCV: 88 fL (ref 79–97)
MONOS ABS: 0.3 10*3/uL (ref 0.1–0.9)
Monocytes: 6 %
NEUTROS PCT: 56 %
Neutrophils Absolute: 2.8 10*3/uL (ref 1.4–7.0)
PLATELETS: 190 10*3/uL (ref 150–379)
RBC: 4.67 x10E6/uL (ref 4.14–5.80)
RDW: 13.5 % (ref 12.3–15.4)
WBC: 5.1 10*3/uL (ref 3.4–10.8)

## 2015-11-14 LAB — HEPATIC FUNCTION PANEL
ALT: 15 IU/L (ref 0–44)
AST: 23 IU/L (ref 0–40)
Albumin: 4.3 g/dL (ref 3.6–4.8)
Alkaline Phosphatase: 50 IU/L (ref 39–117)
BILIRUBIN, DIRECT: 0.16 mg/dL (ref 0.00–0.40)
Bilirubin Total: 0.7 mg/dL (ref 0.0–1.2)
TOTAL PROTEIN: 6.3 g/dL (ref 6.0–8.5)

## 2015-11-14 LAB — NMR, LIPOPROFILE
CHOLESTEROL: 148 mg/dL (ref 100–199)
HDL CHOLESTEROL BY NMR: 54 mg/dL (ref >39)
HDL Particle Number: 33.9 umol/L (ref >=30.5)
LDL PARTICLE NUMBER: 925 nmol/L (ref <1000)
LDL SIZE: 20.7 nm (ref >20.5)
LDL-C: 84 mg/dL (ref 0–99)
LP-IR Score: 25 (ref <=45)
Small LDL Particle Number: 267 nmol/L (ref <=527)
TRIGLYCERIDES BY NMR: 52 mg/dL (ref 0–149)

## 2015-11-14 LAB — VITAMIN D 25 HYDROXY (VIT D DEFICIENCY, FRACTURES): VIT D 25 HYDROXY: 42.4 ng/mL (ref 30.0–100.0)

## 2015-11-14 NOTE — Addendum Note (Signed)
Addended by: Zannie Cove on: 11/14/2015 11:29 AM   Modules accepted: Orders

## 2015-11-14 NOTE — Progress Notes (Signed)
Subjective:    [name]: Christopher Avery, male    DOB: 02/10/1950, 65 y.o.   MRN: [id number]  HPI Pt here for follow up and management of chronic medical problems which includes diabetes, afib, hyperlipidemia and hypertension. He is taking medications regularly.This patient is a brittle diabetic. He is currently seeing Dr. Chalmers Cater to help get his blood sugars under better control. The patient has had lab work done and this will be reviewed with him during the visit today. His blood sugar was elevated at 140. The creatinine the most important kidney function test was normal and the electrolytes were good including the potassium. The CBC was also normal and liver function tests were within normal limits. The vitamin D level was good and we'll make sure that he continues to take his vitamin D3 daily. Cholesterol numbers with advanced lipid testing were not returned at the time of the visit. The patient denies any chest pain or pressure. He says he has not had a stress test in several years and we will look into repeating that. He denies any shortness of breath cough or congestion and recently saw the pulmonologist and was given a good report from the pulmonologist. He has no trouble with heartburn indigestion nausea vomiting diarrhea blood in the stool or black tarry bowel movements. He is passing his water without problems.     Patient Active Problem List   Diagnosis Date Noted  . Chronic atrial fibrillation ([place]) 07/04/2015  . Incisional hernia, without obstruction or gangrene 11/29/2013  . Vitamin D deficiency 11/29/2013  . Type 2 diabetes mellitus treated with insulin ([place]) 07/29/2012  . Long term (current) use of anticoagulants 04/22/2012  . Hyperlipidemia 06/23/2008  . Benign essential HTN 06/23/2008  . Paroxysmal atrial fibrillation ([place]) 06/23/2008  . Asbestos exposure 06/23/2008   Outpatient Encounter Prescriptions as of 11/14/2015  Medication Sig  . atorvastatin (LIPITOR) 10 MG  tablet Take 5 mg by mouth daily.    [name] co-enzyme Q-10 30 MG capsule Take 100 mg by mouth daily.   [name] diltiazem (CARDIZEM CD) 180 MG 24 hr capsule Take 1 capsule (180 mg total) by mouth daily.  [name] glucagon (GLUCAGON EMERGENCY) 1 MG injection Inject as needed for hypoglycemia with unresponsiveness (family member to review instructions on how to administer)  . insulin aspart (NOVOLOG FLEXPEN) 100 UNIT/ML FlexPen Inject 2 Units into the skin 2 (two) times daily with a meal. Use according to sliding scale given in office  . Insulin Detemir (LEVEMIR FLEXTOUCH) 100 UNIT/ML Pen Inject 20 Units into the skin daily. (Patient taking differently: Inject 22 Units into the skin daily. )  . oxyCODONE (OXY IR/ROXICODONE) 5 MG immediate release tablet Take 1-2 tablets (5-10 mg total) by mouth every 4 (four) hours as needed for moderate pain, severe pain or breakthrough pain.  . sildenafil (REVATIO) 20 MG tablet TAKE 2-5 TABLETS AS NEEDED PRIOR TO SEXUAL ACTIVITY  . sotalol (BETAPACE) 80 MG tablet Take 1 tablet (80 mg total) by mouth 2 (two) times daily. <PLEASE MAKE APPOINTMENT FOR REFILLS> (Patient taking differently: Take 40 mg by mouth 2 (two) times daily. <PLEASE MAKE APPOINTMENT FOR REFILLS>)  . valACYclovir (VALTREX) 500 MG tablet TAKE 4 TABLETS BY MOUTH TWICE DAILY AS DIRECTED AND AS NEEDED  . warfarin (COUMADIN) 2.5 MG tablet TAKE 1 TO 1 & 1/2 TABLETS BY MOUTH DAILY AS DIRECTED. (Patient taking differently: TAKE 1 & 1/2 TABLETS BY MOUTH DAILY AS DIRECTED.)   No facility-administered encounter medications on file as  of 11/14/2015.       Review of Systems  Constitutional: Negative.   HENT: Negative.   Eyes: Negative.   Respiratory: Negative.   Cardiovascular: Negative.   Gastrointestinal: Negative.   Endocrine: Negative.        Issues to Blood sugar - Dr Chalmers Cater   Genitourinary: Negative.   Musculoskeletal: Negative.   Skin: Negative.   Allergic/Immunologic: Negative.   Neurological: Negative.     Hematological: Negative.   Psychiatric/Behavioral: Negative.        Objective:   Physical Exam  Constitutional: He is oriented to person, place, and time. He appears well-developed and well-nourished. No distress.  The patient is positive and has a more positive attitude about his diabetes and being followed by the endocrinologist  HENT:  Head: Normocephalic and atraumatic.  Right Ear: External ear normal.  Left Ear: External ear normal.  Nose: Nose normal.  Mouth/Throat: Oropharynx is clear and moist. No oropharyngeal exudate.  Eyes: Conjunctivae and EOM are normal. Pupils are equal, round, and reactive to light. Right eye exhibits no discharge. Left eye exhibits no discharge. No scleral icterus.  Neck: Normal range of motion. Neck supple. No thyromegaly present.  No thyromegaly anterior cervical adenopathy or bruits  Cardiovascular: Normal rate, regular rhythm and intact distal pulses.   No murmur heard. The heart has a regular rate and rhythm at 60/m  Pulmonary/Chest: Effort normal and breath sounds normal. No respiratory distress. He has no wheezes. He has no rales. He exhibits no tenderness.  Clear anteriorly and posteriorly no axillary adenopathy  Abdominal: Soft. Bowel sounds are normal. He exhibits no mass. There is no tenderness. There is no rebound and no guarding.  No abdominal tenderness or organ enlargement or masses and no inguinal adenopathy  Musculoskeletal: Normal range of motion. He exhibits no edema.  Left knee replacement  Lymphadenopathy:    He has no cervical adenopathy.  Neurological: He is alert and oriented to person, place, and time. He has normal reflexes. No cranial nerve deficit.  Skin: Skin is warm and dry. No rash noted.  Psychiatric: He has a normal mood and affect. His behavior is normal. Judgment and thought content normal.  Nursing note and vitals reviewed.  BP 127/71 (BP Location: Left Arm)   Pulse (!) 58   Temp 97.3 F (36.3 C) (Oral)   Ht  6' (1.829 m)   Wt 149 lb (67.6 kg)   BMI 20.21 kg/m         Assessment & Plan:  1. Paroxysmal atrial fibrillation (HCC) -The rhythm was regular today and the patient will continue with current treatment and periodic visits with his cardiologist  2. Type 2 diabetes mellitus not at goal [facility]) -The most recent hemoglobin A1c was elevated and more than previously. The patient is making a lot of changes and adjustments to his diet and with a new insulin regimen. Hoping to Avery that this will improve with these changes. He will continue to follow-up with the endocrinologist  3. Other hyperlipidemia -Continue with aggressive therapeutic lifestyle changes and atorvastatin -The advanced lipid panel had not been returned at the time of this visit and the patient will be called with those results as soon as they become available  4. Benign essential HTN -The blood pressure is good today and he will continue with current treatment  5. Vitamin D deficiency -The vitamin D level was good and he will continue with current treatment  6. Melanoma of skin ([place]) -Continue to follow-up regularly  with dermatology  Patient Instructions                       Medicare Annual Wellness Visit  [facility] and the medical providers at [facility] strive to bring you the best medical care.  In doing so we not only want to address your current medical conditions and concerns but also to detect new conditions early and prevent illness, disease and health-related problems.    Medicare offers a yearly Wellness Visit which allows our clinical staff to assess your need for preventative services including immunizations, lifestyle education, counseling to decrease risk of preventable diseases and screening for fall risk and other medical concerns.    This visit is provided free of charge (no copay) for all Medicare recipients. The clinical pharmacists at [facility] have  begun to conduct these Wellness Visits which will also include a thorough review of all your medications.    As you primary medical provider recommend that you make an appointment for your Annual Wellness Visit if you have not done so already this year.  You may set up this appointment before you leave today or you may call back [id number] and schedule an appointment.  Please make sure when you call that you mention that you are scheduling your Annual Wellness Visit with the clinical pharmacist so that the appointment may be made for the proper length of time.     Continue current medications. Continue good therapeutic lifestyle changes which include good diet and exercise. Fall precautions discussed with patient. If an FOBT was given today- please return it to our front desk. If you are over 71 years old - you may need Prevnar 56 or the adult Pneumonia vaccine.  **Flu shots are available--- please call and schedule a FLU-CLINIC appointment**  After your visit with [place] today you will receive a survey in the mail or online from [employer] regarding your care with [place]. Please take a moment to fill this out. Your feedback is very important to [place] as you can help [place] better understand your patient needs as well as improve your experience and satisfaction. WE CARE ABOUT YOU!!!   Continue to follow-up with cardiology and we will arrange for you to have a stress test if he feels like that is necessary since you haven't had one and a good while It is a good idea for you to take a flu shot Follow-up with pulmonology and cardiology and endocrinology as requested by the specialist This winter drink plenty of fluids and stay well hydrated The flu shot that you Avery today may make your arm sore  Arrie Senate MD

## 2015-11-14 NOTE — Telephone Encounter (Signed)
Pt aware by VM that he cant do his here due to Dx and risk factors  - will set up appt with Dr Gwenlyn Found

## 2015-11-14 NOTE — Patient Instructions (Addendum)
Medicare Annual Wellness Visit  [facility] and the medical providers at [facility] strive to bring you the best medical care.  In doing so we not only want to address your current medical conditions and concerns but also to detect new conditions early and prevent illness, disease and health-related problems.    Medicare offers a yearly Wellness Visit which allows our clinical staff to assess your need for preventative services including immunizations, lifestyle education, counseling to decrease risk of preventable diseases and screening for fall risk and other medical concerns.    This visit is provided free of charge (no copay) for all Medicare recipients. The clinical pharmacists at [facility] have begun to conduct these Wellness Visits which will also include a thorough review of all your medications.    As you primary medical provider recommend that you make an appointment for your Annual Wellness Visit if you have not done so already this year.  You may set up this appointment before you leave today or you may call back [phone] and schedule an appointment.  Please make sure when you call that you mention that you are scheduling your Annual Wellness Visit with the clinical pharmacist so that the appointment may be made for the proper length of time.     Continue current medications. Continue good therapeutic lifestyle changes which include good diet and exercise. Fall precautions discussed with patient. If an FOBT was given today- please return it to our front desk. If you are over 95 years old - you may need Prevnar 48 or the adult Pneumonia vaccine.  **Flu shots are available--- please call and schedule a FLU-CLINIC appointment**  After your visit with [place] today you will receive a survey in the mail or online from [employer] regarding your care with [place]. Please take a moment to fill this out. Your feedback is very  important to [place] as you can help [place] better understand your patient needs as well as improve your experience and satisfaction. WE CARE ABOUT YOU!!!   Continue to follow-up with cardiology and we will arrange for you to have a stress test if he feels like that is necessary since you haven't had one and a good while It is a good idea for you to take a flu shot Follow-up with pulmonology and cardiology and endocrinology as requested by the specialist This winter drink plenty of fluids and stay well hydrated The flu shot that you see today may make your arm sore

## 2015-11-19 ENCOUNTER — Ambulatory Visit (INDEPENDENT_AMBULATORY_CARE_PROVIDER_SITE_OTHER): Payer: Medicare Other | Admitting: Physician Assistant

## 2015-11-19 ENCOUNTER — Encounter: Payer: Self-pay | Admitting: Physician Assistant

## 2015-11-19 VITALS — BP 120/60 | Ht 73.0 in | Wt 150.8 lb

## 2015-11-19 DIAGNOSIS — I4891 Unspecified atrial fibrillation: Secondary | ICD-10-CM

## 2015-11-19 DIAGNOSIS — Z136 Encounter for screening for cardiovascular disorders: Secondary | ICD-10-CM

## 2015-11-19 NOTE — Patient Instructions (Signed)
Medication Instructions:   NO CHANGE  Testing/Procedures:  Your physician has requested that you have a stress echocardiogram. For further information please visit [email or web address]. Please follow instruction sheet as given.    Follow-Up:  Your physician recommends that you schedule a follow-up appointment in: AS SCHEDULED WITH DR Christopher Avery   Exercise Stress Echocardiogram An exercise stress echocardiogram is a heart (cardiac) test used to check the function of your heart. This test may also be called an exercise stress echocardiography or stress echo. This stress test will check how well your heart muscle and valves are working and determine if your heart muscle is getting enough blood. You will exercise on a treadmill to naturally increase or stress the functioning of your heart.  An echocardiogram uses sound waves (ultrasound) to produce an image of your heart. If your heart does not work normally, it may indicate coronary artery disease with poor coronary blood supply. The coronary arteries are the arteries that bring blood and oxygen to your heart. LET [facility] CARE PROVIDER KNOW ABOUT:  Any allergies you have.  All medicines you are taking, including vitamins, herbs, eye drops, creams, and over-the-counter medicines.  Previous problems you or members of your family have had with the use of anesthetics.  Any blood disorders you have.  Previous surgeries you have had.  Medical conditions you have.  Possibility of pregnancy, if this applies. RISKS AND COMPLICATIONS Generally, this is a safe procedure. However, as with any procedure, complications can occur. Possible complications can include:  You develop pain or pressure in the following areas:  Chest.  Jaw or neck.  Between your shoulder blades.  Radiating down your left arm.  Dizziness or lightheadedness.  Shortness of breath.  Increased or irregular heartbeat.  Nausea or vomiting.  Heart attack  (rare). BEFORE THE PROCEDURE  Avoid all forms of caffeine for 24 hours before your test or as directed by your health care provider. This includes coffee, tea (even decaffeinated tea), caffeinated sodas, chocolate, cocoa, and certain pain medicines.  Follow your health care provider's instructions regarding eating and drinking before the test.  Take your medicines as directed at regular times with water unless instructed otherwise. Exceptions may include:  If you have diabetes, ask how you are to take your insulin or pills. It is common to adjust insulin dosing the morning of the test.  If you are taking beta-blocker medicines, it is important to talk to your health care provider about these medicines well before the date of your test. Taking beta-blocker medicines may interfere with the test. In some cases, these medicines need to be changed or stopped 24 hours or more before the test.  If you wear a nitroglycerin patch, it may need to be removed prior to the test. Ask your health care provider if the patch should be removed before the test.  If you use an inhaler for any breathing condition, bring it with you to the test.  If you are an outpatient, bring a snack so you can eat right after the stress phase of the test.  Do not smoke for 4 hours prior to the test or as directed by your health care provider.  Wear loose-fitting clothes and comfortable shoes for the test. This test involves walking on a treadmill. PROCEDURE   Multiple electrodes will be put on your chest. If needed, small areas of your chest may be shaved to get better contact with the electrodes. Once the electrodes are attached to your body, multiple  wires will be attached to the electrodes, and your heart rate will be monitored.  You will have an echocardiogram done at rest.  To produce this image of your heart, gel is applied to your chest, and a wand-like tool (transducer) is moved over the chest. The transducer sends  the sound waves through the chest to create the moving images of your heart.  You may need an IV to receive a medication that improves the quality of the pictures.  You will then walk on a treadmill. The treadmill will be started at a slow pace. The treadmill speed and incline will gradually be increased to raise your heart rate.  At the peak of exercise, the treadmill will be stopped. You will lie down immediately on a bed so that a second echocardiogram can be done to visualize your heart's motion with exercise.  The test usually takes 30-60 minutes to complete. AFTER THE PROCEDURE  Your heart rate and blood pressure will be monitored after the test.  You may return to your normal schedule, including diet, activities, and medicines, unless your health care provider tells you otherwise.   This information is not intended to replace advice given to you by your health care provider. Make sure you discuss any questions you have with your health care provider.   Document Released: 01/25/2004 Document Revised: 01/25/2013 Document Reviewed: 09/27/2012 Elsevier Interactive Patient Education [employer].
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
## 2015-11-27 ENCOUNTER — Telehealth: Payer: Self-pay | Admitting: Family Medicine

## 2015-11-28 MED ORDER — SILDENAFIL CITRATE 20 MG PO TABS: ORAL_TABLET | ORAL | 2 refills | Status: DC

## 2015-11-28 NOTE — Telephone Encounter (Signed)
rx sent to pharm requested

## 2015-11-29 ENCOUNTER — Encounter: Payer: Self-pay | Admitting: *Deleted

## 2015-12-12 ENCOUNTER — Ambulatory Visit (INDEPENDENT_AMBULATORY_CARE_PROVIDER_SITE_OTHER): Payer: Medicare Other | Admitting: Pharmacist

## 2015-12-12 DIAGNOSIS — I482 Chronic atrial fibrillation, unspecified: Secondary | ICD-10-CM

## 2015-12-12 LAB — COAGUCHEK XS/INR WAIVED
INR: 2.2 — AB (ref 0.9–1.1)
Prothrombin Time: 26.5 s

## 2015-12-13 ENCOUNTER — Encounter: Payer: Medicare Other | Admitting: Pharmacist

## 2015-12-13 DIAGNOSIS — E1165 Type 2 diabetes mellitus with hyperglycemia: Secondary | ICD-10-CM | POA: Diagnosis not present

## 2015-12-13 DIAGNOSIS — E162 Hypoglycemia, unspecified: Secondary | ICD-10-CM | POA: Diagnosis not present

## 2015-12-13 DIAGNOSIS — I10 Essential (primary) hypertension: Secondary | ICD-10-CM | POA: Diagnosis not present

## 2015-12-13 DIAGNOSIS — E7800 Pure hypercholesterolemia, unspecified: Secondary | ICD-10-CM | POA: Diagnosis not present

## 2015-12-17 ENCOUNTER — Encounter: Payer: Self-pay | Admitting: Pharmacist

## 2015-12-17 ENCOUNTER — Other Ambulatory Visit: Payer: Self-pay | Admitting: Physician Assistant

## 2015-12-17 DIAGNOSIS — Z01818 Encounter for other preprocedural examination: Secondary | ICD-10-CM

## 2015-12-20 ENCOUNTER — Other Ambulatory Visit (HOSPITAL_COMMUNITY): Payer: Self-pay

## 2015-12-20 ENCOUNTER — Telehealth (HOSPITAL_COMMUNITY): Payer: Self-pay

## 2015-12-20 NOTE — Telephone Encounter (Signed)
Encounter complete. 

## 2015-12-25 ENCOUNTER — Ambulatory Visit (HOSPITAL_COMMUNITY)
Admission: RE | Admit: 2015-12-25 | Discharge: 2015-12-25 | Disposition: A | Discharge status: Home / Self Care | Payer: Medicare Other | Source: Ambulatory Visit | Attending: Cardiovascular Disease | Admitting: Cardiovascular Disease

## 2015-12-25 ENCOUNTER — Encounter (HOSPITAL_COMMUNITY): Payer: Self-pay

## 2015-12-25 DIAGNOSIS — Z01818 Encounter for other preprocedural examination: Secondary | ICD-10-CM

## 2015-12-26 ENCOUNTER — Telehealth (HOSPITAL_COMMUNITY): Payer: Self-pay

## 2015-12-26 NOTE — Telephone Encounter (Signed)
Encounter complete. 

## 2016-01-01 ENCOUNTER — Telehealth: Payer: Self-pay | Admitting: Cardiovascular Disease

## 2016-01-01 NOTE — Telephone Encounter (Signed)
Spoke w/pt's wife, [name] and pt.  Appeal still pending for lexiscan.  Will r/s once approved.  Pt understands to go to emergency dept should he develop any chest pain. Clarise Cruz [facility], [phone] called after I spoke w/pt.  The appeal is reopened and decision will be decided NLT 01-08-16 (15 calendar days from 12-24-15 date of appeal submission)

## 2016-01-02 ENCOUNTER — Inpatient Hospital Stay (HOSPITAL_COMMUNITY): Admission: RE | Admit: 2016-01-02 | Payer: 59 | Source: Ambulatory Visit

## 2016-01-16 ENCOUNTER — Telehealth (HOSPITAL_COMMUNITY): Payer: Self-pay

## 2016-01-16 NOTE — Telephone Encounter (Signed)
Encounter complete. 

## 2016-01-17 ENCOUNTER — Encounter (HOSPITAL_COMMUNITY): Payer: 59

## 2016-01-18 ENCOUNTER — Ambulatory Visit (HOSPITAL_COMMUNITY)
Admission: RE | Admit: 2016-01-18 | Discharge: 2016-01-18 | Disposition: A | Discharge status: Home / Self Care | Payer: Medicare Other | Source: Ambulatory Visit | Attending: Cardiology | Admitting: Cardiology

## 2016-01-18 DIAGNOSIS — E119 Type 2 diabetes mellitus without complications: Secondary | ICD-10-CM | POA: Insufficient documentation

## 2016-01-18 DIAGNOSIS — E785 Hyperlipidemia, unspecified: Secondary | ICD-10-CM | POA: Diagnosis not present

## 2016-01-18 DIAGNOSIS — I10 Essential (primary) hypertension: Secondary | ICD-10-CM | POA: Diagnosis not present

## 2016-01-18 DIAGNOSIS — Z01818 Encounter for other preprocedural examination: Secondary | ICD-10-CM | POA: Diagnosis not present

## 2016-01-18 DIAGNOSIS — Z794 Long term (current) use of insulin: Secondary | ICD-10-CM | POA: Insufficient documentation

## 2016-01-18 DIAGNOSIS — Z8249 Family history of ischemic heart disease and other diseases of the circulatory system: Secondary | ICD-10-CM | POA: Insufficient documentation

## 2016-01-18 DIAGNOSIS — I480 Paroxysmal atrial fibrillation: Secondary | ICD-10-CM | POA: Diagnosis not present

## 2016-01-18 LAB — MYOCARDIAL PERFUSION IMAGING
CHL CUP NUCLEAR SDS: 2
CHL CUP RESTING HR STRESS: 56 {beats}/min
LV dias vol: 102 mL (ref 62–150)
LVSYSVOL: 47 mL
NUC STRESS TID: 1.04
Peak HR: 83 {beats}/min
SRS: 1
SSS: 3

## 2016-01-18 MED ORDER — REGADENOSON 0.4 MG/5ML IV SOLN
0.4000 mg | Freq: Once | INTRAVENOUS | Status: AC
  Administered 2016-01-18: 0.4 mg via INTRAVENOUS

## 2016-01-18 MED ORDER — TECHNETIUM TC 99M TETROFOSMIN IV KIT
10.5000 | PACK | Freq: Once | INTRAVENOUS | Status: AC | PRN
  Administered 2016-01-18: 10.5 via INTRAVENOUS
  Filled 2016-01-18: qty 11

## 2016-01-18 MED ORDER — TECHNETIUM TC 99M TETROFOSMIN IV KIT
30.5000 | PACK | Freq: Once | INTRAVENOUS | Status: AC | PRN
  Administered 2016-01-18: 30.5 via INTRAVENOUS
  Filled 2016-01-18: qty 31

## 2016-01-23 ENCOUNTER — Ambulatory Visit: Payer: Self-pay | Admitting: Nutrition

## 2016-01-23 ENCOUNTER — Ambulatory Visit (INDEPENDENT_AMBULATORY_CARE_PROVIDER_SITE_OTHER): Payer: Medicare Other | Admitting: Pharmacist

## 2016-01-23 DIAGNOSIS — E119 Type 2 diabetes mellitus without complications: Secondary | ICD-10-CM | POA: Diagnosis not present

## 2016-01-23 DIAGNOSIS — I482 Chronic atrial fibrillation, unspecified: Secondary | ICD-10-CM

## 2016-01-23 DIAGNOSIS — Z794 Long term (current) use of insulin: Secondary | ICD-10-CM | POA: Diagnosis not present

## 2016-01-23 LAB — COAGUCHEK XS/INR WAIVED
INR: 3.1 — ABNORMAL HIGH (ref 0.9–1.1)
PROTHROMBIN TIME: 37 s

## 2016-01-24 LAB — MICROALBUMIN / CREATININE URINE RATIO
CREATININE, UR: 164.4 mg/dL
MICROALBUM., U, RANDOM: 12.8 ug/mL
Microalb/Creat Ratio: 7.8 mg/g creat (ref 0.0–30.0)

## 2016-02-05 ENCOUNTER — Other Ambulatory Visit: Payer: Self-pay | Admitting: *Deleted

## 2016-02-05 MED ORDER — WARFARIN SODIUM 2.5 MG PO TABS: ORAL_TABLET | ORAL | 0 refills | Status: DC

## 2016-02-13 ENCOUNTER — Telehealth: Payer: Self-pay | Admitting: Family Medicine

## 2016-02-13 NOTE — Telephone Encounter (Signed)
Holding warfarin is not usually required prior to a single tooth extraction unless it is the preference of then dentist to hold. Patient notified and will discuss with this dentist.

## 2016-03-04 ENCOUNTER — Other Ambulatory Visit: Payer: Self-pay | Admitting: Pharmacist

## 2016-03-04 ENCOUNTER — Encounter: Payer: Self-pay | Admitting: Pharmacist

## 2016-03-04 ENCOUNTER — Ambulatory Visit (INDEPENDENT_AMBULATORY_CARE_PROVIDER_SITE_OTHER): Payer: Medicare Other | Admitting: Pharmacist

## 2016-03-04 DIAGNOSIS — I482 Chronic atrial fibrillation, unspecified: Secondary | ICD-10-CM

## 2016-03-04 DIAGNOSIS — D6859 Other primary thrombophilia: Secondary | ICD-10-CM | POA: Diagnosis not present

## 2016-03-04 DIAGNOSIS — Z86718 Personal history of other venous thrombosis and embolism: Secondary | ICD-10-CM | POA: Diagnosis not present

## 2016-03-04 LAB — COAGUCHEK XS/INR WAIVED
INR: 2.1 — AB (ref 0.9–1.1)
PROTHROMBIN TIME: 24.9 s

## 2016-03-04 MED ORDER — SCOPOLAMINE 1 MG/3DAYS TD PT72: 1.0000 | MEDICATED_PATCH | TRANSDERMAL | 0 refills | Status: DC

## 2016-03-04 MED ORDER — WARFARIN SODIUM 2.5 MG PO TABS: ORAL_TABLET | ORAL | 0 refills | Status: DC

## 2016-03-11 ENCOUNTER — Telehealth: Payer: Self-pay | Admitting: Family Medicine

## 2016-03-11 MED ORDER — SCOPOLAMINE 1 MG/3DAYS TD PT72: 1.0000 | MEDICATED_PATCH | TRANSDERMAL | 0 refills | Status: AC

## 2016-03-11 NOTE — Telephone Encounter (Signed)
May give Transderm scope 3 patches apply as directed

## 2016-03-14 ENCOUNTER — Other Ambulatory Visit: Payer: Self-pay | Admitting: Family Medicine

## 2016-04-08 ENCOUNTER — Ambulatory Visit (INDEPENDENT_AMBULATORY_CARE_PROVIDER_SITE_OTHER): Payer: Medicare Other | Admitting: Pharmacist

## 2016-04-08 DIAGNOSIS — Z86718 Personal history of other venous thrombosis and embolism: Secondary | ICD-10-CM | POA: Diagnosis not present

## 2016-04-08 DIAGNOSIS — D6859 Other primary thrombophilia: Secondary | ICD-10-CM

## 2016-04-08 DIAGNOSIS — I482 Chronic atrial fibrillation, unspecified: Secondary | ICD-10-CM

## 2016-04-08 LAB — COAGUCHEK XS/INR WAIVED
INR: 2.5 — ABNORMAL HIGH (ref 0.9–1.1)
PROTHROMBIN TIME: 29.4 s

## 2016-04-11 DIAGNOSIS — E1165 Type 2 diabetes mellitus with hyperglycemia: Secondary | ICD-10-CM | POA: Diagnosis not present

## 2016-04-11 DIAGNOSIS — E162 Hypoglycemia, unspecified: Secondary | ICD-10-CM | POA: Diagnosis not present

## 2016-04-14 DIAGNOSIS — S61210D Laceration without foreign body of right index finger without damage to nail, subsequent encounter: Secondary | ICD-10-CM | POA: Diagnosis not present

## 2016-04-17 ENCOUNTER — Other Ambulatory Visit: Payer: Self-pay | Admitting: Family Medicine

## 2016-04-17 ENCOUNTER — Telehealth: Payer: Self-pay | Admitting: Family Medicine

## 2016-04-17 MED ORDER — VALACYCLOVIR HCL 500 MG PO TABS: ORAL_TABLET | ORAL | 0 refills | Status: DC

## 2016-04-17 NOTE — Telephone Encounter (Signed)
Rx sent to pharmacy and pt is aware. 

## 2016-04-17 NOTE — Telephone Encounter (Signed)
What is the name of the medication? Valtrex   Have you contacted your pharmacy to request a refill? yes  Which pharmacy would you like this sent to? [facility]   Patient notified that their request is being sent to the clinical staff for review and that they should receive a call once it is complete. If they do not receive a call within 24 hours they can check with their pharmacy or our office.

## 2016-04-18 ENCOUNTER — Other Ambulatory Visit: Payer: Self-pay | Admitting: Family Medicine

## 2016-04-29 DIAGNOSIS — L570 Actinic keratosis: Secondary | ICD-10-CM | POA: Diagnosis not present

## 2016-04-29 DIAGNOSIS — Z1283 Encounter for screening for malignant neoplasm of skin: Secondary | ICD-10-CM | POA: Diagnosis not present

## 2016-04-29 DIAGNOSIS — Z85820 Personal history of malignant melanoma of skin: Secondary | ICD-10-CM | POA: Diagnosis not present

## 2016-04-29 DIAGNOSIS — L988 Other specified disorders of the skin and subcutaneous tissue: Secondary | ICD-10-CM | POA: Diagnosis not present

## 2016-04-29 DIAGNOSIS — X32XXXD Exposure to sunlight, subsequent encounter: Secondary | ICD-10-CM | POA: Diagnosis not present

## 2016-04-29 DIAGNOSIS — Z08 Encounter for follow-up examination after completed treatment for malignant neoplasm: Secondary | ICD-10-CM | POA: Diagnosis not present

## 2016-04-29 DIAGNOSIS — D485 Neoplasm of uncertain behavior of skin: Secondary | ICD-10-CM | POA: Diagnosis not present

## 2016-04-29 DIAGNOSIS — D225 Melanocytic nevi of trunk: Secondary | ICD-10-CM | POA: Diagnosis not present

## 2016-04-30 ENCOUNTER — Encounter: Payer: Self-pay | Admitting: Family Medicine

## 2016-04-30 ENCOUNTER — Ambulatory Visit (INDEPENDENT_AMBULATORY_CARE_PROVIDER_SITE_OTHER): Payer: Medicare Other | Admitting: Family Medicine

## 2016-04-30 VITALS — BP 128/80 | HR 61 | Temp 97.3°F | Ht 73.0 in | Wt 154.0 lb

## 2016-04-30 DIAGNOSIS — E119 Type 2 diabetes mellitus without complications: Secondary | ICD-10-CM | POA: Diagnosis not present

## 2016-04-30 DIAGNOSIS — I10 Essential (primary) hypertension: Secondary | ICD-10-CM | POA: Diagnosis not present

## 2016-04-30 DIAGNOSIS — E559 Vitamin D deficiency, unspecified: Secondary | ICD-10-CM

## 2016-04-30 DIAGNOSIS — Z794 Long term (current) use of insulin: Secondary | ICD-10-CM | POA: Diagnosis not present

## 2016-04-30 DIAGNOSIS — E784 Other hyperlipidemia: Secondary | ICD-10-CM | POA: Diagnosis not present

## 2016-04-30 DIAGNOSIS — E7849 Other hyperlipidemia: Secondary | ICD-10-CM

## 2016-04-30 DIAGNOSIS — Z23 Encounter for immunization: Secondary | ICD-10-CM | POA: Diagnosis not present

## 2016-04-30 DIAGNOSIS — I480 Paroxysmal atrial fibrillation: Secondary | ICD-10-CM

## 2016-04-30 MED ORDER — FLUTICASONE PROPIONATE 50 MCG/ACT NA SUSP: 2.0000 | Freq: Every day | NASAL | 6 refills | Status: DC

## 2016-04-30 NOTE — Progress Notes (Signed)
Subjective:    [name]: Christopher Avery, male    DOB: [birth year], 66 y.o.   MRN: [id number]  HPI Pt here for follow up and management of chronic medical problems which includes diabetes, hyperlipidemia and hypertension. He is taking medication regularly.This patient has a long history of brittle diabetes as well as a history of paroxysmal atrial fibrillation. He is seeing an endocrinologist now his last hemoglobin A1c 2 weeks ago was 8.1%. His main complaint today is some sinus congestion. The patient is seeing Dr. Soyla Murphy and just saw HER two weeks ago and will Avery her again in 2-3 weeks. She is working with him to get an insulin pump. He feels that his blood sugars have not been quite is variable. The patient's next colonoscopy is not due until early 2020. He does not remember who did it last. He denies any chest pain pressure palpitations shortness of breath. He denies any trouble with his stomach currently including nausea vomiting diarrhea blood in the stool or black tarry bowel movements. He is passing his water without problems. He seems positive and he seems to have a good handle on what he needs to eat and what he needs to avoid eating. He will get a Prevnar vaccine injection today.   Patient Active Problem List   Diagnosis Date Noted  . Primary hypercoagulable state ([place]) [D68.59] 03/04/2016  . History of DVT of lower extremity 03/04/2016  . Melanoma of skin ([place]) 11/14/2015  . Chronic atrial fibrillation ([place]) 07/04/2015  . Incisional hernia, without obstruction or gangrene 11/29/2013  . Vitamin D deficiency 11/29/2013  . Type 2 diabetes mellitus treated with insulin ([place]) 07/29/2012  . Long term (current) use of anticoagulants 04/22/2012  . Hyperlipidemia 06/23/2008  . Benign essential HTN 06/23/2008  . Paroxysmal atrial fibrillation ([place]) 06/23/2008  . Asbestos exposure 06/23/2008   Outpatient Encounter Prescriptions as of 04/30/2016  Medication Sig  . atorvastatin (LIPITOR)  10 MG tablet Take 5 mg by mouth daily.    . BD PEN NEEDLE NANO U/F 32G X 4 MM MISC USE WITH INSULIN 4 TIMES A DAY  . co-enzyme Q-10 30 MG capsule Take 100 mg by mouth daily.   [name] diltiazem (CARDIZEM CD) 180 MG 24 hr capsule Take 1 capsule (180 mg total) by mouth daily.  . insulin aspart (NOVOLOG FLEXPEN) 100 UNIT/ML FlexPen Inject 2 Units into the skin 2 (two) times daily with a meal. Use according to sliding scale given in office (Patient taking differently: Inject 2-5 Units into the skin 3 (three) times daily with meals. Use according to sliding scale given in office)  . Insulin Glargine (BASAGLAR KWIKPEN) 100 UNIT/ML SOPN Inject 15 Units into the skin every evening.  . sildenafil (REVATIO) 20 MG tablet TAKE 2-5 TABLETS AS NEEDED PRIOR TO SEXUAL ACTIVITY  . sotalol (BETAPACE) 80 MG tablet Take 1 tablet (80 mg total) by mouth 2 (two) times daily. <PLEASE MAKE APPOINTMENT FOR REFILLS> (Patient taking differently: Take 40 mg by mouth 2 (two) times daily. <PLEASE MAKE APPOINTMENT FOR REFILLS>)  . warfarin (COUMADIN) 2.5 MG tablet TAKE 1 TO 1 & 1/2 TABLETS BY MOUTH DAILY AS DIRECTED.  [name] valACYclovir (VALTREX) 500 MG tablet TAKE 4 TABLETS BY MOUTH TWICE DAILY AS DIRECTED AND AS NEEDED (Patient not taking: Reported on 04/30/2016)  . [DISCONTINUED] valACYclovir (VALTREX) 500 MG tablet TAKE 4 TABLETS BY MOUTH TWICE DAILY AS DIRECTED AND AS NEEDED   No facility-administered encounter medications on file as of 04/30/2016.  Review of Systems  Constitutional: Negative.   HENT: Positive for sinus pressure.   Eyes: Negative.   Respiratory: Negative.   Cardiovascular: Negative.   Gastrointestinal: Negative.   Endocrine: Negative.   Genitourinary: Negative.   Musculoskeletal: Negative.   Skin: Negative.   Allergic/Immunologic: Negative.   Neurological: Negative.   Hematological: Negative.   Psychiatric/Behavioral: Negative.        Objective:   Physical Exam  Constitutional: He is oriented to  person, place, and time. He appears well-developed and well-nourished. No distress.  The patient is pleasant and alert and appears to be very on top of his health care issues. He sees a cardiologist on a yearly basis and recently had a stress test that was normal according to the patient. He just had a visit with his dermatologist in because of a past history of melanoma he sees the dermatologist yearly and did have some skin biopsies done yesterday. He has a return appointment with his endocrinologist soon and has seen the pulmonologist in the past year. He understands his colonoscopy will be due in early 2020.  HENT:  Head: Normocephalic and atraumatic.  Right Ear: External ear normal.  Left Ear: External ear normal.  Mouth/Throat: Oropharynx is clear and moist. No oropharyngeal exudate.  Nasal congestion bilaterally  Eyes: Conjunctivae and EOM are normal. Pupils are equal, round, and reactive to light. Right eye exhibits no discharge. Left eye exhibits no discharge. No scleral icterus.  Neck: Normal range of motion. Neck supple. No thyromegaly present.  No bruits or thyromegaly or anterior cervical adenopathy  Cardiovascular: Normal rate, regular rhythm, normal heart sounds and intact distal pulses.   No murmur heard. The heart is regular at 60/m  Pulmonary/Chest: Effort normal and breath sounds normal. No respiratory distress. He has no wheezes. He has no rales. He exhibits no tenderness.  No axillary adenopathy and good breath sounds anteriorly and posteriorly  Abdominal: Soft. Bowel sounds are normal. He exhibits no mass. There is no tenderness. There is no rebound and no guarding.  No abdominal tenderness masses or organ enlargement or bruits  Musculoskeletal: Normal range of motion. He exhibits no edema.  Lymphadenopathy:    He has no cervical adenopathy.  Neurological: He is alert and oriented to person, place, and time. He has normal reflexes. No cranial nerve deficit.  Skin: Skin is  warm and dry. No rash noted.  Psychiatric: He has a normal mood and affect. His behavior is normal. Judgment and thought content normal.  Nursing note and vitals reviewed.  BP 128/80 (BP Location: Right Arm)   Pulse 61   Temp 97.3 F (36.3 C) (Oral)   Ht '6\' 1"'  (1.854 m)   Wt 154 lb (69.9 kg)   BMI 20.32 kg/m         Assessment & Plan:  1. Type 2 diabetes mellitus treated with insulin (HCC) -Continue to follow-up by Dr. York Spaniel and work with her with the insulin pump as she is doing to help you get better blood sugar control - CBC with Differential/Platelet; Future - BMP8+EGFR; Future  2. Paroxysmal atrial fibrillation ([facility]) -The heart was regular today but the patient will continue to follow-up with the cardiologist yearly because of his multiple risk factors for heart disease. - CBC with Differential/Platelet; Future  3. Other hyperlipidemia -Continue with aggressive therapeutic lifestyle changes including his atorvastatin. - CBC with Differential/Platelet; Future - NMR, lipoprofile; Future  4. Benign essential HTN -The blood pressure is good today and he will continue  with current treatment - CBC with Differential/Platelet; Future - BMP8+EGFR; Future - Hepatic function panel; Future  5. Vitamin D deficiency -Continue with vitamin D replacement pending results of lab work - CBC with Differential/Platelet; Future - VITAMIN D 25 Hydroxy (Vit-D Deficiency, Fractures); Future  Patient Instructions                       Medicare Annual Wellness Visit  [facility] and the medical providers at [facility] strive to bring you the best medical care.  In doing so we not only want to address your current medical conditions and concerns but also to detect new conditions early and prevent illness, disease and health-related problems.    Medicare offers a yearly Wellness Visit which allows our clinical staff to assess your need for preventative services  including immunizations, lifestyle education, counseling to decrease risk of preventable diseases and screening for fall risk and other medical concerns.    This visit is provided free of charge (no copay) for all Medicare recipients. The clinical pharmacists at [facility] have begun to conduct these Wellness Visits which will also include a thorough review of all your medications.    As you primary medical provider recommend that you make an appointment for your Annual Wellness Visit if you have not done so already this year.  You may set up this appointment before you leave today or you may call back ([phone]) and schedule an appointment.  Please make sure when you call that you mention that you are scheduling your Annual Wellness Visit with the clinical pharmacist so that the appointment may be made for the proper length of time.     Continue current medications. Continue good therapeutic lifestyle changes which include good diet and exercise. Fall precautions discussed with patient. If an FOBT was given today- please return it to our front desk. If you are over 90 years old - you may need Prevnar 61 or the adult Pneumonia vaccine.  **Flu shots are available--- please call and schedule a FLU-CLINIC appointment**  After your visit with [place] today you will receive a survey in the mail or online from [employer] regarding your care with [place]. Please take a moment to fill this out. Your feedback is very important to [place] as you can help [place] better understand your patient needs as well as improve your experience and satisfaction. WE CARE ABOUT YOU!!!   Continue to follow-up with endocrinologist Stay active physically Watch diet closely and keep working to get hemoglobin A1c is close to 7 as possible Use Flonase 1 spray each nostril at bedtime Use nasal saline during the day  Arrie Senate MD

## 2016-04-30 NOTE — Patient Instructions (Addendum)
Medicare Annual Wellness Visit  [facility] and the medical providers at [facility] strive to bring you the best medical care.  In doing so we not only want to address your current medical conditions and concerns but also to detect new conditions early and prevent illness, disease and health-related problems.    Medicare offers a yearly Wellness Visit which allows our clinical staff to assess your need for preventative services including immunizations, lifestyle education, counseling to decrease risk of preventable diseases and screening for fall risk and other medical concerns.    This visit is provided free of charge (no copay) for all Medicare recipients. The clinical pharmacists at [facility] have begun to conduct these Wellness Visits which will also include a thorough review of all your medications.    As you primary medical provider recommend that you make an appointment for your Annual Wellness Visit if you have not done so already this year.  You may set up this appointment before you leave today or you may call back ([phone]) and schedule an appointment.  Please make sure when you call that you mention that you are scheduling your Annual Wellness Visit with the clinical pharmacist so that the appointment may be made for the proper length of time.     Continue current medications. Continue good therapeutic lifestyle changes which include good diet and exercise. Fall precautions discussed with patient. If an FOBT was given today- please return it to our front desk. If you are over 52 years old - you may need Prevnar 51 or the adult Pneumonia vaccine.  **Flu shots are available--- please call and schedule a FLU-CLINIC appointment**  After your visit with [place] today you will receive a survey in the mail or online from [employer] regarding your care with [place]. Please take a moment to fill this out. Your feedback is very  important to [place] as you can help [place] better understand your patient needs as well as improve your experience and satisfaction. WE CARE ABOUT YOU!!!   Continue to follow-up with endocrinologist Stay active physically Watch diet closely and keep working to get hemoglobin A1c is close to 7 as possible Use Flonase 1 spray each nostril at bedtime Use nasal saline during the day

## 2016-04-30 NOTE — Addendum Note (Signed)
Addended by: Zannie Cove on: 04/30/2016 09:58 AM   Modules accepted: Orders

## 2016-05-05 ENCOUNTER — Other Ambulatory Visit: Payer: Medicare Other

## 2016-05-05 DIAGNOSIS — E7849 Other hyperlipidemia: Secondary | ICD-10-CM

## 2016-05-05 DIAGNOSIS — E119 Type 2 diabetes mellitus without complications: Secondary | ICD-10-CM | POA: Diagnosis not present

## 2016-05-05 DIAGNOSIS — E784 Other hyperlipidemia: Secondary | ICD-10-CM | POA: Diagnosis not present

## 2016-05-05 DIAGNOSIS — I482 Chronic atrial fibrillation, unspecified: Secondary | ICD-10-CM

## 2016-05-05 DIAGNOSIS — Z794 Long term (current) use of insulin: Secondary | ICD-10-CM

## 2016-05-05 DIAGNOSIS — I480 Paroxysmal atrial fibrillation: Secondary | ICD-10-CM

## 2016-05-05 DIAGNOSIS — Z86718 Personal history of other venous thrombosis and embolism: Secondary | ICD-10-CM

## 2016-05-05 DIAGNOSIS — I10 Essential (primary) hypertension: Secondary | ICD-10-CM | POA: Diagnosis not present

## 2016-05-05 DIAGNOSIS — E559 Vitamin D deficiency, unspecified: Secondary | ICD-10-CM

## 2016-05-05 LAB — COAGUCHEK XS/INR WAIVED
INR: 1.5 — ABNORMAL HIGH (ref 0.9–1.1)
Prothrombin Time: 18 s

## 2016-05-06 ENCOUNTER — Ambulatory Visit (INDEPENDENT_AMBULATORY_CARE_PROVIDER_SITE_OTHER): Payer: Self-pay | Admitting: Pharmacist

## 2016-05-06 DIAGNOSIS — I482 Chronic atrial fibrillation, unspecified: Secondary | ICD-10-CM

## 2016-05-06 DIAGNOSIS — D6859 Other primary thrombophilia: Secondary | ICD-10-CM

## 2016-05-06 DIAGNOSIS — Z86718 Personal history of other venous thrombosis and embolism: Secondary | ICD-10-CM

## 2016-05-06 DIAGNOSIS — L570 Actinic keratosis: Secondary | ICD-10-CM | POA: Diagnosis not present

## 2016-05-06 DIAGNOSIS — D485 Neoplasm of uncertain behavior of skin: Secondary | ICD-10-CM | POA: Diagnosis not present

## 2016-05-06 LAB — NMR, LIPOPROFILE
CHOLESTEROL: 154 mg/dL (ref 100–199)
HDL CHOLESTEROL BY NMR: 51 mg/dL (ref >39)
HDL Particle Number: 30.1 umol/L — ABNORMAL LOW (ref >=30.5)
LDL PARTICLE NUMBER: 1135 nmol/L — AB (ref <1000)
LDL Size: 20.8 nm (ref >20.5)
LDL-C: 90 mg/dL (ref 0–99)
Small LDL Particle Number: 423 nmol/L (ref <=527)
TRIGLYCERIDES BY NMR: 65 mg/dL (ref 0–149)

## 2016-05-06 LAB — BMP8+EGFR
BUN/Creatinine Ratio: 20 (ref 10–24)
BUN: 16 mg/dL (ref 8–27)
CALCIUM: 9.1 mg/dL (ref 8.6–10.2)
CHLORIDE: 100 mmol/L (ref 96–106)
CO2: 24 mmol/L (ref 18–29)
Creatinine, Ser: 0.79 mg/dL (ref 0.76–1.27)
GFR calc Af Amer: 109 mL/min/{1.73_m2} (ref >59)
GFR, EST NON AFRICAN AMERICAN: 94 mL/min/{1.73_m2} (ref >59)
GLUCOSE: 283 mg/dL — AB (ref 65–99)
POTASSIUM: 4.7 mmol/L (ref 3.5–5.2)
Sodium: 138 mmol/L (ref 134–144)

## 2016-05-06 LAB — CBC WITH DIFFERENTIAL/PLATELET
Basophils Absolute: 0 10*3/uL (ref 0.0–0.2)
Basos: 0 %
EOS (ABSOLUTE): 0.1 10*3/uL (ref 0.0–0.4)
Eos: 3 %
HEMATOCRIT: 42.4 % (ref 37.5–51.0)
HEMOGLOBIN: 13.9 g/dL (ref 13.0–17.7)
Immature Grans (Abs): 0 10*3/uL (ref 0.0–0.1)
Immature Granulocytes: 0 %
LYMPHS ABS: 1.1 10*3/uL (ref 0.7–3.1)
Lymphs: 29 %
MCH: 30.5 pg (ref 26.6–33.0)
MCHC: 32.8 g/dL (ref 31.5–35.7)
MCV: 93 fL (ref 79–97)
MONOCYTES: 6 %
Monocytes Absolute: 0.3 10*3/uL (ref 0.1–0.9)
NEUTROS ABS: 2.4 10*3/uL (ref 1.4–7.0)
Neutrophils: 62 %
Platelets: 152 10*3/uL (ref 150–379)
RBC: 4.55 x10E6/uL (ref 4.14–5.80)
RDW: 13.5 % (ref 12.3–15.4)
WBC: 3.9 10*3/uL (ref 3.4–10.8)

## 2016-05-06 LAB — HEPATIC FUNCTION PANEL
ALBUMIN: 4.4 g/dL (ref 3.6–4.8)
ALK PHOS: 55 IU/L (ref 39–117)
ALT: 19 IU/L (ref 0–44)
AST: 23 IU/L (ref 0–40)
BILIRUBIN TOTAL: 0.7 mg/dL (ref 0.0–1.2)
BILIRUBIN, DIRECT: 0.2 mg/dL (ref 0.00–0.40)
TOTAL PROTEIN: 6.3 g/dL (ref 6.0–8.5)

## 2016-05-06 LAB — VITAMIN D 25 HYDROXY (VIT D DEFICIENCY, FRACTURES): Vit D, 25-Hydroxy: 30.3 ng/mL (ref 30.0–100.0)

## 2016-05-08 ENCOUNTER — Telehealth: Payer: Self-pay | Admitting: Cardiovascular Disease

## 2016-05-08 NOTE — Telephone Encounter (Signed)
Requesting surgical clearance:  1. Type of surgery: Left Index Finger: mass removal  2. Surgeon: Linna Hoff, MD   3.Surgical Date:  06/04/16  4. Medications that need to be held: Warfarin 2.5 mg (takes 1 & 1/2 tabs qd)--followed by Cherre Robins, PharmD    5. CAD: No  6. I will defer to:  Dr. Pearla Dubonnet Information:  [facility] Phone:  [phone] Fax:  [phone]

## 2016-05-14 NOTE — Telephone Encounter (Signed)
Okay to interrupt oral anticoagulant for hand surgery

## 2016-05-16 IMAGING — CR DG CHEST 2V
2 series · 2 of 2 positions shown · non-contrast
Comparison: 07/02/2012

CLINICAL DATA: Asbestosis, history unspecified hyperlipidemia,
essential hypertension, atrial fibrillation, type 2 diabetes

EXAM:
CHEST  2 VIEW

[view not recorded (1 of 2)]
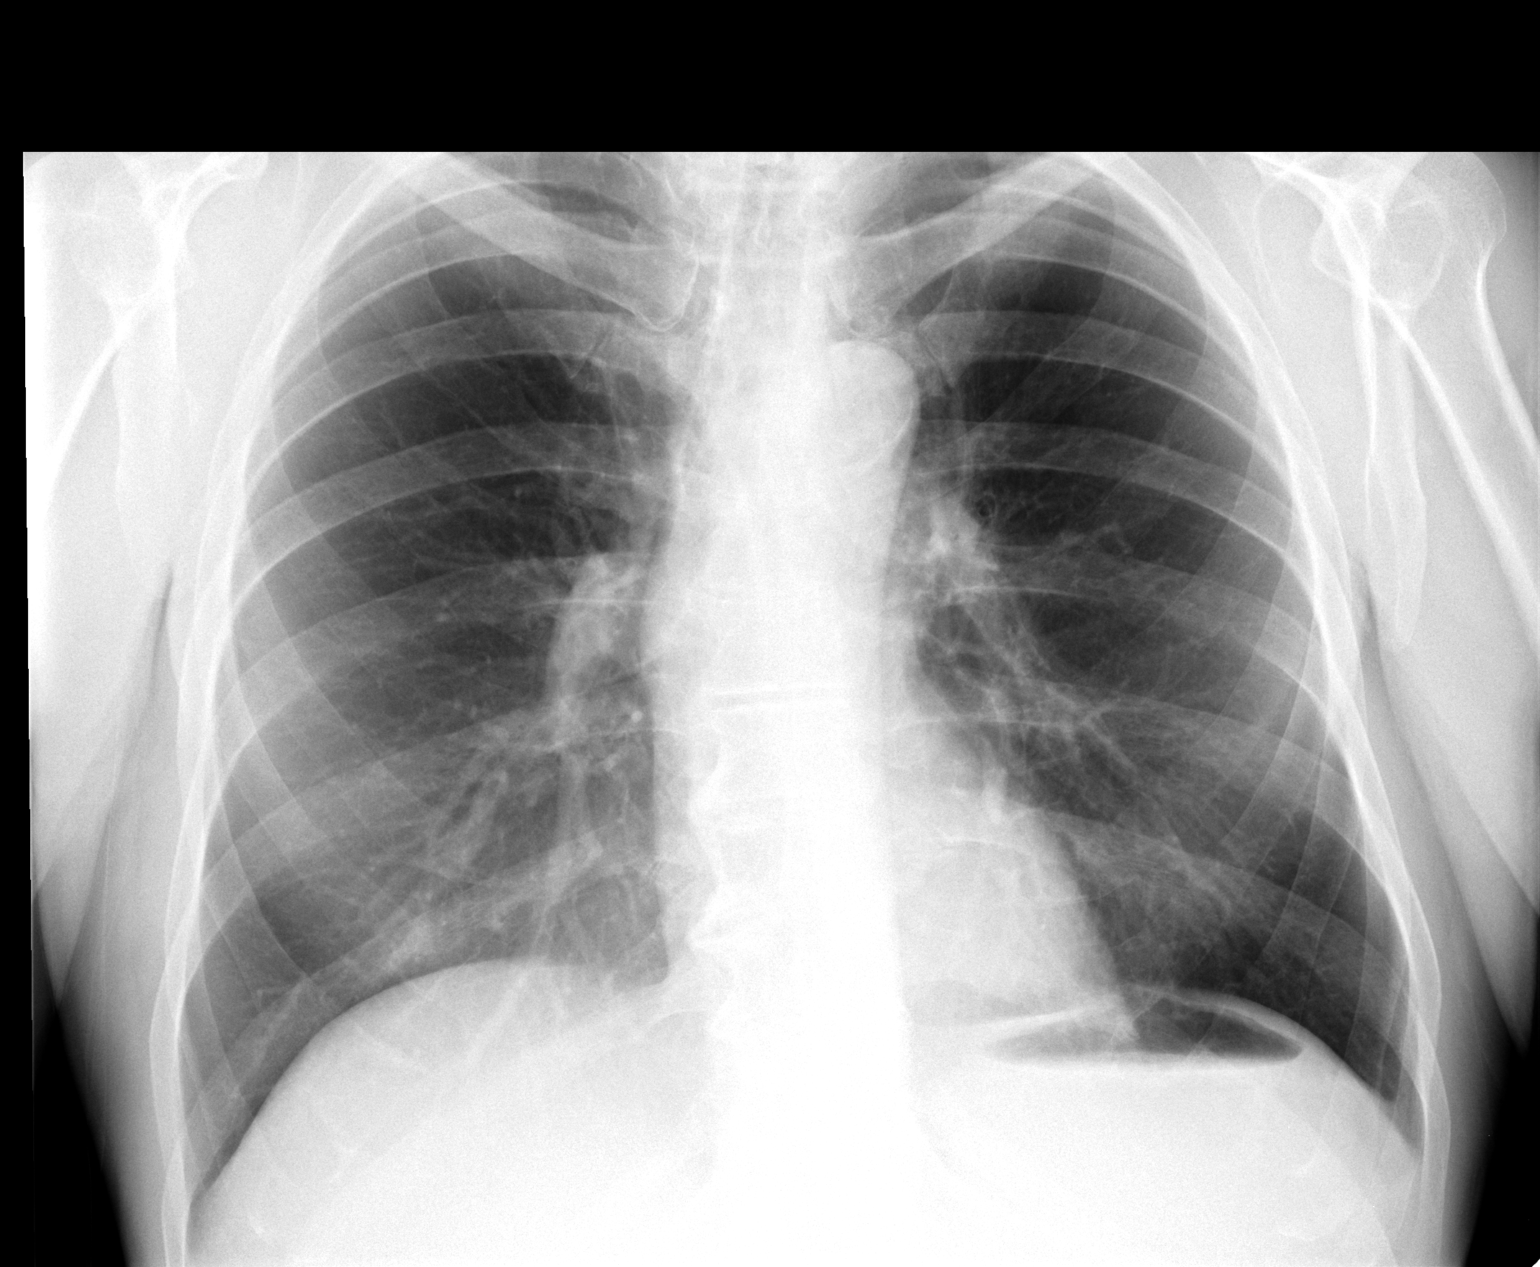

[view not recorded (2 of 2)]
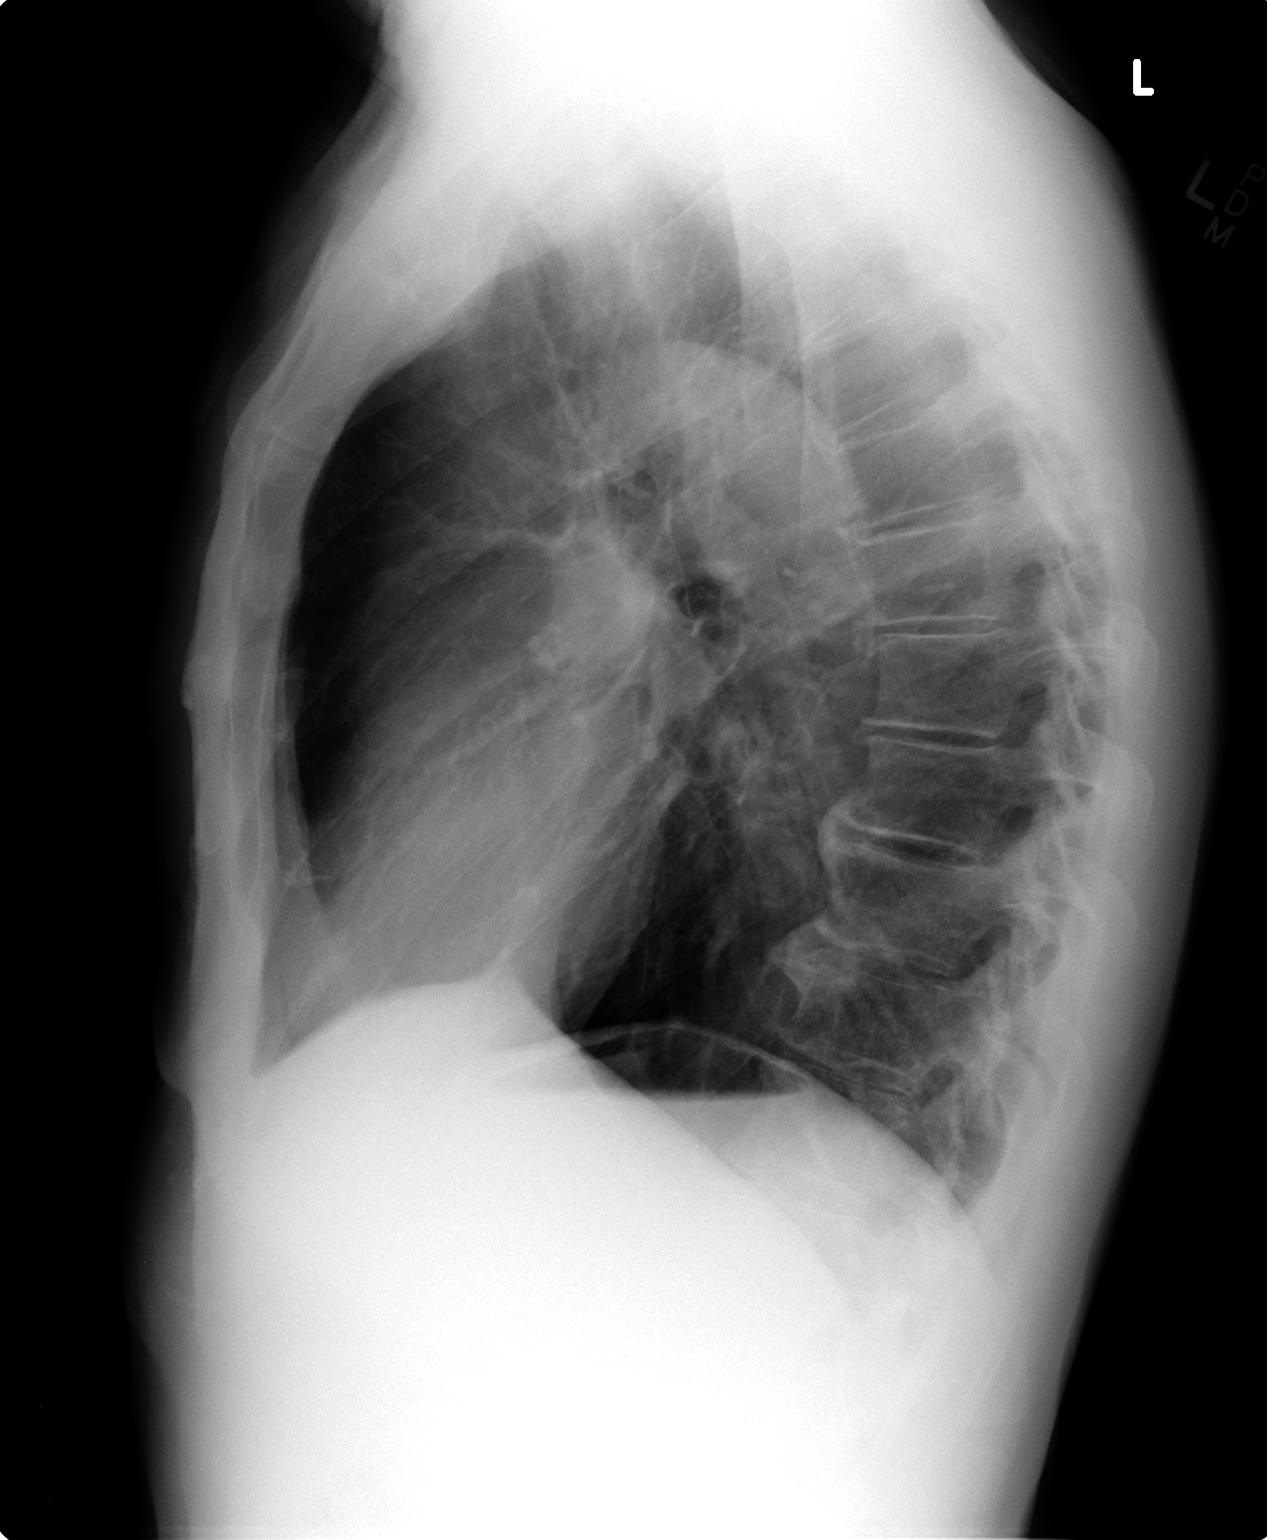

[2 of 2 positions shown; findings below may reference images not displayed]

FINDINGS: Normal heart size, mediastinal contours, and pulmonary vascularity.

Lungs mildly hyperinflated but clear.

No pleural effusion or pneumothorax.

No definite chronic interstitial lung disease changes or pleural
plaques identified.

Scattered endplate spur formation thoracic spine.
IMPRESSION: No acute abnormalities.

## 2016-05-19 NOTE — Telephone Encounter (Signed)
Clearance routed to number provided via EPIC. 

## 2016-05-21 ENCOUNTER — Ambulatory Visit (INDEPENDENT_AMBULATORY_CARE_PROVIDER_SITE_OTHER): Payer: Medicare Other | Admitting: Pharmacist

## 2016-05-21 DIAGNOSIS — I482 Chronic atrial fibrillation, unspecified: Secondary | ICD-10-CM

## 2016-05-21 DIAGNOSIS — D6859 Other primary thrombophilia: Secondary | ICD-10-CM | POA: Diagnosis not present

## 2016-05-21 DIAGNOSIS — Z86718 Personal history of other venous thrombosis and embolism: Secondary | ICD-10-CM | POA: Diagnosis not present

## 2016-05-21 LAB — COAGUCHEK XS/INR WAIVED
INR: 1.3 — ABNORMAL HIGH (ref 0.9–1.1)
PROTHROMBIN TIME: 15.9 s

## 2016-05-30 ENCOUNTER — Telehealth: Payer: Self-pay | Admitting: Family Medicine

## 2016-05-30 NOTE — Telephone Encounter (Signed)
Covering PCP, please advise and send back to the pools. 

## 2016-05-30 NOTE — Telephone Encounter (Signed)
Pt aware.

## 2016-05-30 NOTE — Telephone Encounter (Signed)
Stop coumadin 3 days prior to procedure and restart day after procedure

## 2016-06-04 ENCOUNTER — Encounter: Payer: Self-pay | Admitting: Pharmacist

## 2016-06-04 ENCOUNTER — Other Ambulatory Visit: Payer: Self-pay

## 2016-06-04 DIAGNOSIS — S66190D Other injury of flexor muscle, fascia and tendon of right index finger at wrist and hand level, subsequent encounter: Secondary | ICD-10-CM | POA: Diagnosis not present

## 2016-06-04 DIAGNOSIS — D481 Neoplasm of uncertain behavior of connective and other soft tissue: Secondary | ICD-10-CM | POA: Diagnosis not present

## 2016-06-04 DIAGNOSIS — L720 Epidermal cyst: Secondary | ICD-10-CM | POA: Diagnosis not present

## 2016-06-04 DIAGNOSIS — S61210D Laceration without foreign body of right index finger without damage to nail, subsequent encounter: Secondary | ICD-10-CM | POA: Diagnosis not present

## 2016-06-10 ENCOUNTER — Encounter: Payer: Self-pay | Admitting: Pharmacist

## 2016-06-10 ENCOUNTER — Other Ambulatory Visit: Payer: Self-pay | Admitting: Family Medicine

## 2016-06-10 DIAGNOSIS — D481 Neoplasm of uncertain behavior of connective and other soft tissue: Secondary | ICD-10-CM | POA: Diagnosis not present

## 2016-06-17 ENCOUNTER — Other Ambulatory Visit: Payer: Self-pay | Admitting: Family Medicine

## 2016-06-17 DIAGNOSIS — Z4789 Encounter for other orthopedic aftercare: Secondary | ICD-10-CM | POA: Diagnosis not present

## 2016-06-17 DIAGNOSIS — S61210D Laceration without foreign body of right index finger without damage to nail, subsequent encounter: Secondary | ICD-10-CM | POA: Diagnosis not present

## 2016-07-03 ENCOUNTER — Other Ambulatory Visit: Payer: Self-pay | Admitting: Cardiovascular Disease

## 2016-07-03 ENCOUNTER — Other Ambulatory Visit: Payer: Self-pay | Admitting: Family Medicine

## 2016-07-08 ENCOUNTER — Ambulatory Visit (INDEPENDENT_AMBULATORY_CARE_PROVIDER_SITE_OTHER): Payer: Medicare Other | Admitting: Pharmacist

## 2016-07-08 DIAGNOSIS — D6859 Other primary thrombophilia: Secondary | ICD-10-CM

## 2016-07-08 DIAGNOSIS — I482 Chronic atrial fibrillation, unspecified: Secondary | ICD-10-CM

## 2016-07-08 DIAGNOSIS — Z86718 Personal history of other venous thrombosis and embolism: Secondary | ICD-10-CM | POA: Diagnosis not present

## 2016-07-08 LAB — COAGUCHEK XS/INR WAIVED
INR: 4.1 — ABNORMAL HIGH (ref 0.9–1.1)
Prothrombin Time: 49.7 s

## 2016-07-08 NOTE — Patient Instructions (Signed)
Anticoagulation Warfarin Dose Instructions as of 07/08/2016      Christopher Avery Tue Wed Thu Fri Sat   New Dose   0 mg   2.5 mg 3.75 mg 3.75 mg 3.75 mg   Alt Week 3.75 mg 5 mg 3.75 mg 3.75 mg 3.75 mg 3.75 mg 3.75 mg    Description   No warfarin tonight - Tuesday, July 08, 2016.  Take only 1 tablet tomorrow - Wednesday, June 6th, 2018.  Then decrease warfarin 2.5mg  tablet dose to 1 and 1/2 tablets daily except 2 tablets on Mondays only  INR was 4.1 today

## 2016-07-29 ENCOUNTER — Other Ambulatory Visit: Payer: Self-pay | Admitting: Family Medicine

## 2016-07-30 ENCOUNTER — Telehealth: Payer: Self-pay | Admitting: Cardiovascular Disease

## 2016-07-30 ENCOUNTER — Ambulatory Visit (INDEPENDENT_AMBULATORY_CARE_PROVIDER_SITE_OTHER): Payer: Medicare Other | Admitting: Cardiovascular Disease

## 2016-07-30 ENCOUNTER — Other Ambulatory Visit: Payer: Self-pay | Admitting: Cardiovascular Disease

## 2016-07-30 ENCOUNTER — Encounter: Payer: Self-pay | Admitting: Cardiovascular Disease

## 2016-07-30 DIAGNOSIS — E7800 Pure hypercholesterolemia, unspecified: Secondary | ICD-10-CM

## 2016-07-30 DIAGNOSIS — I480 Paroxysmal atrial fibrillation: Secondary | ICD-10-CM

## 2016-07-30 DIAGNOSIS — I10 Essential (primary) hypertension: Secondary | ICD-10-CM

## 2016-07-30 MED ORDER — SOTALOL HCL 80 MG PO TABS: 80.0000 mg | ORAL_TABLET | Freq: Two times a day (BID) | ORAL | 3 refills | Status: DC

## 2016-07-30 MED ORDER — ATORVASTATIN CALCIUM 20 MG PO TABS: ORAL_TABLET | ORAL | 3 refills | Status: DC

## 2016-07-30 MED ORDER — DILTIAZEM HCL ER COATED BEADS 180 MG PO CP24: 180.0000 mg | ORAL_CAPSULE | Freq: Every day | ORAL | 3 refills | Status: DC

## 2016-07-30 NOTE — Assessment & Plan Note (Signed)
History of proximal atrial fibrillation maintaining sinus rhythm on Coumadin and sotalol.

## 2016-07-30 NOTE — Patient Instructions (Signed)
Medication Instructions: Your physician recommends that you continue on your current medications as directed. Please refer to the Current Medication list given to you today.  I sent your refills [facility] in [place].   Follow-Up: Your physician wants you to follow-up in: 1 year with Dr. Gwenlyn Found. You will receive a reminder letter in the mail two months in advance. If you don't receive a letter, please call our office to schedule the follow-up appointment.  If you need a refill on your cardiac medications before your next appointment, please call your pharmacy.

## 2016-07-30 NOTE — Assessment & Plan Note (Signed)
History of hypertension blood pressure measured at 123/76. He is on diltiazem. Continue current meds at current dosing

## 2016-07-30 NOTE — Telephone Encounter (Signed)
°  New Prob   Calling to verify directions for atorvastatin (LIPITOR) 20 MG tablet. Please call.

## 2016-07-30 NOTE — Progress Notes (Signed)
07/30/2016 Christopher Avery   09-14-1950  [id number]  Primary Physician Chipper Herb, MD Primary Cardiologist: Lorretta Harp MD Renae Gloss  HPI:  The patient is a delightful 66 year old thin and fit-appearing married Caucasian male, father of 58, grandfather of 4 grandchildren, who I saw 09/11/15. His mother-in-law, Darnelle Bos, was a patient lying for a long time and died last 2022-08-02.He has a history of paroxysmal atrial fibrillation, maintaining sinus rhythm on Coumadin anticoagulation and sotalol. His other problems include hypertension, hyperlipidemia, and family history of heart disease. He is totally asymptomatic. Since I saw him a year ago he denies chest pain or shortness of breath. His last Myoview performed 01/18/16 was low risk.   Current Outpatient Prescriptions  Medication Sig Dispense Refill  . atorvastatin (LIPITOR) 10 MG tablet Take 5 mg by mouth daily.      [name] atorvastatin (LIPITOR) 20 MG tablet TAKE 1 TABLET BY MOUTH ONCE A DAY 90 tablet 0  . BD PEN NEEDLE NANO U/F 32G X 4 MM MISC USE WITH INSULIN 4 TIMES A DAY 400 each 3  . co-enzyme Q-10 30 MG capsule Take 100 mg by mouth daily.     [name] diltiazem (CARDIZEM CD) 180 MG 24 hr capsule TAKE 1 CAPSULE BY MOUTH EVERY DAY 30 capsule 4  . fluticasone (FLONASE) 50 MCG/ACT nasal spray Place 2 sprays into both nostrils daily. 16 g 6  . insulin aspart (NOVOLOG FLEXPEN) 100 UNIT/ML FlexPen Inject 2 Units into the skin 2 (two) times daily with a meal. Use according to sliding scale given in office (Patient taking differently: Inject 2-5 Units into the skin 3 (three) times daily with meals. Use according to sliding scale given in office) 45 mL 0  . Insulin Glargine (BASAGLAR KWIKPEN) 100 UNIT/ML SOPN Inject 15 Units into the skin every evening.  6  . NOVOLOG FLEXPEN 100 UNIT/ML FlexPen INJECT 2 UNITS INTO THE SKIN TWICE DAILY WITH A MEAL. USE ACCORDING TO SLIDING SCALE GIVEN IN OFFICE 15 mL 0  . sildenafil (REVATIO) 20  MG tablet TAKE 2-5 TABLETS AS NEEDED PRIOR TO SEXUAL ACTIVITY 50 tablet 2  . sotalol (BETAPACE) 80 MG tablet TAKE 1 TABLET (80 MG TOTAL) BY MOUTH 2 (TWO) TIMES DAILY. <PLEASE MAKE APPOINTMENT FOR REFILLS> 60 tablet 0  . valACYclovir (VALTREX) 500 MG tablet TAKE 4 TABLETS BY MOUTH TWICE DAILY AS DIRECTED AND AS NEEDED 56 tablet 0  . warfarin (COUMADIN) 2.5 MG tablet TAKE 1 TO 1 AND 1/2 TABLETS DAILY AS DIRECTED 135 tablet 0   No current facility-administered medications for this visit.     Allergies  Allergen Reactions  . Januvia [Sitagliptin] Hives    Social History   Social History  . Marital status: Married    Spouse name: [name]  . Number of children: 2  . Years of education: [name]   Occupational History  . Retired    Social History Main Topics  . Smoking status: Never Smoker  . Smokeless tobacco: Never Used  . Alcohol use 0.0 oz/week     Comment: wine 3-4 x a yr  . Drug use: No  . Sexual activity: Yes   Other Topics Concern  . Not on file   Social History Narrative  . No narrative on file     Review of Systems: General: negative for chills, fever, night sweats or weight changes.  Cardiovascular: negative for chest pain, dyspnea on exertion, edema, orthopnea, palpitations, paroxysmal nocturnal dyspnea or shortness  of breath Dermatological: negative for rash Respiratory: negative for cough or wheezing Urologic: negative for hematuria Abdominal: negative for nausea, vomiting, diarrhea, bright red blood per rectum, melena, or hematemesis Neurologic: negative for visual changes, syncope, or dizziness All other systems reviewed and are otherwise negative except as noted above.    Blood pressure 123/76, pulse (!) 57, height 6\' 1"  (1.854 m), weight 152 lb 12.8 oz (69.3 kg).  General appearance: alert and no distress Neck: no adenopathy, no carotid bruit, no JVD, supple, symmetrical, trachea midline and thyroid not enlarged, symmetric, no tenderness/mass/nodules Lungs:  clear to auscultation bilaterally Heart: regular rate and rhythm, S1, S2 normal, no murmur, click, rub or gallop Extremities: extremities normal, atraumatic, no cyanosis or edema  EKG Sinus bradycardia 57 with left axis deviation and septal Q waves. I personally reviewed this EKG.  ASSESSMENT AND PLAN:   Hyperlipidemia History of hyperlipidemia on statin therapy with recent lipid profile performed 05/05/16 revealing total cholesterol of 154.  Benign essential HTN History of hypertension blood pressure measured at 123/76. He is on diltiazem. Continue current meds at current dosing  Paroxysmal atrial fibrillation (HCC) History of proximal atrial fibrillation maintaining sinus rhythm on Coumadin and sotalol.      Lorretta Harp MD FACP,FACC,FAHA, [facility] 07/30/2016 10:59 AM

## 2016-07-30 NOTE — Assessment & Plan Note (Signed)
History of hyperlipidemia on statin therapy with recent lipid profile performed 05/05/16 revealing total cholesterol of 154.

## 2016-07-30 NOTE — Telephone Encounter (Signed)
Prescription verified for Atorvastatin 5 mg tablet daily. Pharmacist, Marlowe Kays,  verbalized their understanding.

## 2016-08-04 NOTE — Addendum Note (Signed)
Addended by: Zebedee Iba on: 08/04/2016 08:36 AM   Modules accepted: Orders

## 2016-08-05 ENCOUNTER — Ambulatory Visit (INDEPENDENT_AMBULATORY_CARE_PROVIDER_SITE_OTHER): Payer: Medicare Other | Admitting: Pharmacist

## 2016-08-05 DIAGNOSIS — Z86718 Personal history of other venous thrombosis and embolism: Secondary | ICD-10-CM | POA: Diagnosis not present

## 2016-08-05 DIAGNOSIS — D6859 Other primary thrombophilia: Secondary | ICD-10-CM | POA: Diagnosis not present

## 2016-08-05 DIAGNOSIS — I482 Chronic atrial fibrillation, unspecified: Secondary | ICD-10-CM

## 2016-08-05 LAB — COAGUCHEK XS/INR WAIVED
INR: 3.6 — AB (ref 0.9–1.1)
PROTHROMBIN TIME: 43.2 s

## 2016-08-05 NOTE — Patient Instructions (Signed)
Anticoagulation Warfarin Dose Instructions as of 08/05/2016      Christopher Avery Tue Wed Thu Fri Sat   08/05/2016 to 08/09/2016 3.75 mg 5 mg 0 mg 2.5 mg 3.75 mg 3.75 mg 3.75 mg    3.75 mg 3.75 mg 3.75 mg 2.5 mg 3.75 mg 3.75 mg 3.75 mg    Description   No warfarin tonight - Tuesday, August 05, 2016.   Then decrease warfarin 2.5mg  tablet dose to 1 and 1/2 tablets daily except 1tablet on Wednesdays  INR was 3.6 today

## 2016-08-28 DIAGNOSIS — E7800 Pure hypercholesterolemia, unspecified: Secondary | ICD-10-CM | POA: Diagnosis not present

## 2016-08-28 DIAGNOSIS — E162 Hypoglycemia, unspecified: Secondary | ICD-10-CM | POA: Diagnosis not present

## 2016-08-28 DIAGNOSIS — E1165 Type 2 diabetes mellitus with hyperglycemia: Secondary | ICD-10-CM | POA: Diagnosis not present

## 2016-08-28 DIAGNOSIS — I10 Essential (primary) hypertension: Secondary | ICD-10-CM | POA: Diagnosis not present

## 2016-09-02 ENCOUNTER — Ambulatory Visit (INDEPENDENT_AMBULATORY_CARE_PROVIDER_SITE_OTHER): Payer: Medicare Other | Admitting: Pharmacist

## 2016-09-02 DIAGNOSIS — I482 Chronic atrial fibrillation, unspecified: Secondary | ICD-10-CM

## 2016-09-02 DIAGNOSIS — Z86718 Personal history of other venous thrombosis and embolism: Secondary | ICD-10-CM

## 2016-09-02 DIAGNOSIS — D6859 Other primary thrombophilia: Secondary | ICD-10-CM

## 2016-09-02 LAB — COAGUCHEK XS/INR WAIVED
INR: 2.3 — ABNORMAL HIGH (ref 0.9–1.1)
Prothrombin Time: 27 s

## 2016-09-02 NOTE — Patient Instructions (Signed)
Anticoagulation Warfarin Dose Instructions as of 09/02/2016      Christopher Avery Tue Wed Thu Fri Sat   New Dose 3.75 mg 3.75 mg 3.75 mg 2.5 mg 3.75 mg 3.75 mg 3.75 mg    Description   Continue current warfarin 2.5mg  tablet dose - take 1 and 1/2 tablets daily except 1 tablet on Wednesdays  INR was 2.3 today

## 2016-09-14 ENCOUNTER — Other Ambulatory Visit: Payer: Self-pay | Admitting: Family Medicine

## 2016-09-25 ENCOUNTER — Other Ambulatory Visit: Payer: Self-pay | Admitting: Family Medicine

## 2016-10-10 ENCOUNTER — Ambulatory Visit (INDEPENDENT_AMBULATORY_CARE_PROVIDER_SITE_OTHER): Payer: Medicare Other | Admitting: Pharmacist Clinician (PhC)/ Clinical Pharmacy Specialist

## 2016-10-10 DIAGNOSIS — I4891 Unspecified atrial fibrillation: Secondary | ICD-10-CM

## 2016-10-10 DIAGNOSIS — D6859 Other primary thrombophilia: Secondary | ICD-10-CM | POA: Diagnosis not present

## 2016-10-10 DIAGNOSIS — Z86718 Personal history of other venous thrombosis and embolism: Secondary | ICD-10-CM

## 2016-10-10 DIAGNOSIS — I482 Chronic atrial fibrillation, unspecified: Secondary | ICD-10-CM

## 2016-10-10 LAB — COAGUCHEK XS/INR WAIVED
INR: 3 — AB (ref 0.9–1.1)
Prothrombin Time: 36.3 s

## 2016-10-10 NOTE — Patient Instructions (Signed)
Anticoagulation Warfarin Dose Instructions as of 10/10/2016      Christopher Avery Tue Wed Thu Fri Sat   New Dose 3.75 mg 3.75 mg 3.75 mg 2.5 mg 3.75 mg 3.75 mg 3.75 mg    Description   Continue current warfarin 2.5mg  tablet dose - take 1 and 1/2 tablets daily except 1 tablet on Wednesdays  INR was 3.0 today

## 2016-10-22 ENCOUNTER — Other Ambulatory Visit: Payer: Medicare Other

## 2016-10-22 DIAGNOSIS — I10 Essential (primary) hypertension: Secondary | ICD-10-CM

## 2016-10-22 DIAGNOSIS — E7849 Other hyperlipidemia: Secondary | ICD-10-CM

## 2016-10-22 DIAGNOSIS — I482 Chronic atrial fibrillation, unspecified: Secondary | ICD-10-CM

## 2016-10-22 DIAGNOSIS — Z794 Long term (current) use of insulin: Secondary | ICD-10-CM

## 2016-10-22 DIAGNOSIS — E119 Type 2 diabetes mellitus without complications: Secondary | ICD-10-CM | POA: Diagnosis not present

## 2016-10-22 DIAGNOSIS — E559 Vitamin D deficiency, unspecified: Secondary | ICD-10-CM

## 2016-10-22 DIAGNOSIS — E784 Other hyperlipidemia: Secondary | ICD-10-CM | POA: Diagnosis not present

## 2016-10-22 LAB — BAYER DCA HB A1C WAIVED: HB A1C (BAYER DCA - WAIVED): 8.3 % — ABNORMAL HIGH (ref <7.0)

## 2016-10-23 LAB — LIPID PANEL
Chol/HDL Ratio: 2.7 ratio (ref 0.0–5.0)
Cholesterol, Total: 143 mg/dL (ref 100–199)
HDL: 53 mg/dL (ref >39)
LDL Calculated: 79 mg/dL (ref 0–99)
TRIGLYCERIDES: 57 mg/dL (ref 0–149)
VLDL Cholesterol Cal: 11 mg/dL (ref 5–40)

## 2016-10-23 LAB — CBC WITH DIFFERENTIAL/PLATELET
BASOS ABS: 0 10*3/uL (ref 0.0–0.2)
Basos: 0 %
EOS (ABSOLUTE): 0.1 10*3/uL (ref 0.0–0.4)
Eos: 2 %
Hematocrit: 42.8 % (ref 37.5–51.0)
Hemoglobin: 13.7 g/dL (ref 13.0–17.7)
IMMATURE GRANULOCYTES: 0 %
Immature Grans (Abs): 0 10*3/uL (ref 0.0–0.1)
Lymphocytes Absolute: 1.5 10*3/uL (ref 0.7–3.1)
Lymphs: 30 %
MCH: 29.8 pg (ref 26.6–33.0)
MCHC: 32 g/dL (ref 31.5–35.7)
MCV: 93 fL (ref 79–97)
MONOS ABS: 0.3 10*3/uL (ref 0.1–0.9)
Monocytes: 6 %
NEUTROS PCT: 62 %
Neutrophils Absolute: 3.1 10*3/uL (ref 1.4–7.0)
PLATELETS: 170 10*3/uL (ref 150–379)
RBC: 4.59 x10E6/uL (ref 4.14–5.80)
RDW: 13.5 % (ref 12.3–15.4)
WBC: 5.1 10*3/uL (ref 3.4–10.8)

## 2016-10-23 LAB — HEPATIC FUNCTION PANEL
ALBUMIN: 4.3 g/dL (ref 3.6–4.8)
ALK PHOS: 57 IU/L (ref 39–117)
ALT: 17 IU/L (ref 0–44)
AST: 19 IU/L (ref 0–40)
BILIRUBIN TOTAL: 0.6 mg/dL (ref 0.0–1.2)
Bilirubin, Direct: 0.19 mg/dL (ref 0.00–0.40)
Total Protein: 6.4 g/dL (ref 6.0–8.5)

## 2016-10-23 LAB — BMP8+EGFR
BUN/Creatinine Ratio: 21 (ref 10–24)
BUN: 19 mg/dL (ref 8–27)
CO2: 25 mmol/L (ref 20–29)
Calcium: 9.3 mg/dL (ref 8.6–10.2)
Chloride: 100 mmol/L (ref 96–106)
Creatinine, Ser: 0.91 mg/dL (ref 0.76–1.27)
GFR calc Af Amer: 102 mL/min/{1.73_m2} (ref >59)
GFR calc non Af Amer: 88 mL/min/{1.73_m2} (ref >59)
GLUCOSE: 251 mg/dL — AB (ref 65–99)
POTASSIUM: 4.6 mmol/L (ref 3.5–5.2)
SODIUM: 137 mmol/L (ref 134–144)

## 2016-10-23 LAB — VITAMIN D 25 HYDROXY (VIT D DEFICIENCY, FRACTURES): Vit D, 25-Hydroxy: 46.4 ng/mL (ref 30.0–100.0)

## 2016-10-24 DIAGNOSIS — E119 Type 2 diabetes mellitus without complications: Secondary | ICD-10-CM | POA: Diagnosis not present

## 2016-10-24 DIAGNOSIS — H353131 Nonexudative age-related macular degeneration, bilateral, early dry stage: Secondary | ICD-10-CM | POA: Diagnosis not present

## 2016-10-24 DIAGNOSIS — H2513 Age-related nuclear cataract, bilateral: Secondary | ICD-10-CM | POA: Diagnosis not present

## 2016-10-24 LAB — HM DIABETES EYE EXAM

## 2016-10-26 ENCOUNTER — Other Ambulatory Visit: Payer: Self-pay | Admitting: Family Medicine

## 2016-10-27 ENCOUNTER — Ambulatory Visit (INDEPENDENT_AMBULATORY_CARE_PROVIDER_SITE_OTHER): Payer: Medicare Other | Admitting: Family Medicine

## 2016-10-27 ENCOUNTER — Encounter: Payer: Self-pay | Admitting: Family Medicine

## 2016-10-27 VITALS — BP 99/62 | HR 57 | Temp 97.9°F | Ht 73.0 in | Wt 155.0 lb

## 2016-10-27 DIAGNOSIS — E7849 Other hyperlipidemia: Secondary | ICD-10-CM

## 2016-10-27 DIAGNOSIS — N400 Enlarged prostate without lower urinary tract symptoms: Secondary | ICD-10-CM

## 2016-10-27 DIAGNOSIS — I480 Paroxysmal atrial fibrillation: Secondary | ICD-10-CM

## 2016-10-27 DIAGNOSIS — E119 Type 2 diabetes mellitus without complications: Secondary | ICD-10-CM | POA: Diagnosis not present

## 2016-10-27 DIAGNOSIS — E784 Other hyperlipidemia: Secondary | ICD-10-CM | POA: Diagnosis not present

## 2016-10-27 DIAGNOSIS — I10 Essential (primary) hypertension: Secondary | ICD-10-CM

## 2016-10-27 DIAGNOSIS — Z23 Encounter for immunization: Secondary | ICD-10-CM | POA: Diagnosis not present

## 2016-10-27 DIAGNOSIS — E559 Vitamin D deficiency, unspecified: Secondary | ICD-10-CM

## 2016-10-27 DIAGNOSIS — Z794 Long term (current) use of insulin: Secondary | ICD-10-CM | POA: Diagnosis not present

## 2016-10-27 LAB — URINALYSIS, COMPLETE
BILIRUBIN UA: NEGATIVE
LEUKOCYTES UA: NEGATIVE
Nitrite, UA: NEGATIVE
PROTEIN UA: NEGATIVE
RBC UA: NEGATIVE
SPEC GRAV UA: 1.025 (ref 1.005–1.030)
UUROB: 1 mg/dL (ref 0.2–1.0)
pH, UA: 5.5 (ref 5.0–7.5)

## 2016-10-27 LAB — MICROSCOPIC EXAMINATION
BACTERIA UA: NONE SEEN
Epithelial Cells (non renal): NONE SEEN /hpf (ref 0–10)
RBC, UA: NONE SEEN /hpf (ref 0–?)
Renal Epithel, UA: NONE SEEN /hpf
WBC UA: NONE SEEN /HPF (ref 0–?)

## 2016-10-27 MED ORDER — VALACYCLOVIR HCL 500 MG PO TABS: ORAL_TABLET | ORAL | 3 refills | Status: DC

## 2016-10-27 NOTE — Progress Notes (Signed)
Subjective:    [name]: Christopher Avery, male    DOB: 12-20-1950, 66 y.o.   MRN: [id number]  HPI Pt here for follow up and management of chronic medical problems which includes a fib, diabetes and hyperlipidemia. He is taking medication regularly.The patient is doing well overall. He is due for a physical exam today. He is a brittle diabetic. He is followed now regularly by the endocrinologist for his diabetes. He has had recent lab work done and this will be reviewed with him during the visit today and he will be given a copy of this to take with him to his endocrinologist. All liver function tests were normal. The hemoglobin A1c however was elevated at 8.3% and this means better control role is needed. The blood sugar was elevated at 251. The creatinine, the most important kidney function test within normal limits along with all of the electrolytes. CBC was within normal limits. Cholesterol numbers with traditional lipid testing were good with an LDL C being 79 and HDL being 53 and triglycerides good at 57. The patient is currently taking atorvastatin. The vitamin D level was good at 46.4 he will continue with current treatment. The patient sees Dr. Soyla Murphy regularly for his diabetic checks and she is continuing to work on better control for the patient and she is aware of his current blood sugar readings. He denies any chest pain or shortness of breath. He did have a nuclear stress test by the cardiologist, Dr. Gwenlyn Found back in the spring and this was good according to the patient. He sees him yearly. Any nausea vomiting diarrhea blood in the stool or black tarry bowel movements. He is having no trouble with swallowing. He's passing his water well and not having any problems with this and has not seen any blood in the urine.   Patient Active Problem List   Diagnosis Date Noted  . Primary hypercoagulable state ([place]) [D68.59] 03/04/2016  . History of DVT of lower extremity 03/04/2016  . Melanoma of skin  ([place]) 11/14/2015  . Chronic atrial fibrillation ([place]) 07/04/2015  . Incisional hernia, without obstruction or gangrene 11/29/2013  . Vitamin D deficiency 11/29/2013  . Type 2 diabetes mellitus treated with insulin ([place]) 07/29/2012  . Long term (current) use of anticoagulants 04/22/2012  . Hyperlipidemia 06/23/2008  . Benign essential HTN 06/23/2008  . Paroxysmal atrial fibrillation ([place]) 06/23/2008  . Asbestos exposure 06/23/2008   Outpatient Encounter Prescriptions as of 10/27/2016  Medication Sig  . atorvastatin (LIPITOR) 20 MG tablet Take 5 mg by mouth daily.  . BD PEN NEEDLE NANO U/F 32G X 4 MM MISC USE WITH INSULIN 4 TIMES A DAY  . co-enzyme Q-10 30 MG capsule Take 100 mg by mouth daily.   [name] diltiazem (CARDIZEM CD) 180 MG 24 hr capsule Take 1 capsule (180 mg total) by mouth daily.  . fluticasone (FLONASE) 50 MCG/ACT nasal spray Place 2 sprays into both nostrils daily.  . insulin aspart (NOVOLOG FLEXPEN) 100 UNIT/ML FlexPen Inject 2 Units into the skin 2 (two) times daily with a meal. Use according to sliding scale given in office (Patient taking differently: Inject 2-5 Units into the skin 3 (three) times daily with meals. Use according to sliding scale given in office)  . Insulin Glargine (BASAGLAR KWIKPEN) 100 UNIT/ML SOPN Inject 17 Units into the skin every evening.   [name] NOVOLOG FLEXPEN 100 UNIT/ML FlexPen INJECT 2 UNITS INTO THE SKIN TWICE DAILY WITH A MEAL. USE ACCORDING TO SLIDING SCALE GIVEN  IN OFFICE  . sildenafil (REVATIO) 20 MG tablet TAKE 2-5 TABLETS AS NEEDED PRIOR TO SEXUAL ACTIVITY  . sotalol (BETAPACE) 80 MG tablet Take 1 tablet (80 mg total) by mouth 2 (two) times daily.  . valACYclovir (VALTREX) 500 MG tablet TAKE 4 TABLETS BY MOUTH TWICE DAILY AS DIRECTED AND AS NEEDED  . warfarin (COUMADIN) 2.5 MG tablet TAKE 1 TO 1 AND 1/2 TABLETS DAILY AS DIRECTED   No facility-administered encounter medications on file as of 10/27/2016.            Review of Systems    Constitutional: Negative.   HENT: Negative.   Eyes: Negative.   Respiratory: Negative.   Cardiovascular: Negative.   Gastrointestinal: Negative.   Endocrine: Negative.   Genitourinary: Negative.   Musculoskeletal: Negative.   Skin: Negative.   Allergic/Immunologic: Negative.   Neurological: Negative.   Hematological: Negative.   Psychiatric/Behavioral: Negative.        Objective:   Physical Exam  Constitutional: He is oriented to person, place, and time. He appears well-developed and well-nourished. No distress.  The patient is pleasant and alert and feeling well.  HENT:  Head: Normocephalic and atraumatic.  Right Ear: External ear normal.  Left Ear: External ear normal.  Mouth/Throat: Oropharynx is clear and moist. No oropharyngeal exudate.  Minimal nasal congestion bilaterally  Eyes: Pupils are equal, round, and reactive to light. Conjunctivae and EOM are normal. Right eye exhibits no discharge. Left eye exhibits no discharge. No scleral icterus.  Neck: Normal range of motion. Neck supple. No thyromegaly present.  No bruits thyromegaly or anterior cervical adenopathy  Cardiovascular: Normal rate, regular rhythm and normal heart sounds.   No murmur heard. The heart is regular at 72/m. The left radial pulse was difficult to palpate.  Pulmonary/Chest: Effort normal and breath sounds normal. No respiratory distress. He has no wheezes. He has no rales. He exhibits no tenderness.  Clear anteriorly and posteriorly with no chest wall masses or axillary adenopathy  Abdominal: Soft. Bowel sounds are normal. He exhibits no mass. There is no tenderness. There is no rebound and no guarding.  No liver or spleen enlargement. No bruits. No masses. No inguinal adenopathy.  Genitourinary: Rectum normal and penis normal.  Genitourinary Comments: The prostate is enlarged with the left being larger than the right but no lumps or masses. No rectal masses. No inguinal hernias. The external  genitalia were within normal limits.  Musculoskeletal: Normal range of motion. He exhibits no edema.  Lymphadenopathy:    He has no cervical adenopathy.  Neurological: He is alert and oriented to person, place, and time. He has normal reflexes. No cranial nerve deficit.  Skin: Skin is warm and dry. No rash noted.  Psychiatric: He has a normal mood and affect. His behavior is normal. Judgment and thought content normal.  Nursing note and vitals reviewed.  BP 99/62 (BP Location: Left Arm)   Pulse (!) 57   Temp 97.9 F (36.6 C) (Oral)   Ht 6\' 1"  (1.854 m)   Wt 155 lb (70.3 kg)   BMI 20.45 kg/m         Assessment & Plan:  1. Benign essential HTN -The blood pressure is good today he will continue with current treatment  2. Other hyperlipidemia -Cholesterol numbers with traditional lipid testing were good and he will continue with current treatment and therapeutic lifestyle changes  3. Paroxysmal atrial fibrillation (HCC) -The patient will continue to follow-up with Dr. Gwenlyn Found, the cardiologist and the rhythm  today was normal sinus rhythm.  4. Type 2 diabetes mellitus treated with insulin ([place]) -Continue to follow-up with endocrinology with elevated A1c at 8.3%.  5. Vitamin D deficiency -Continue current treatment  6. Benign prostatic hyperplasia, unspecified whether lower urinary tract symptoms present - Urinalysis, Complete  7. BPH -No complaints with this.  Meds ordered this encounter  Medications  . valACYclovir (VALTREX) 500 MG tablet    Sig: TAKE 4 TABLETS BY MOUTH TWICE DAILY AS DIRECTED AND AS NEEDED    Dispense:  56 tablet    Refill:  3   Patient Instructions                       Medicare Annual Wellness Visit  [facility] and the medical providers at [facility] strive to bring you the best medical care.  In doing so we not only want to address your current medical conditions and concerns but also to detect new conditions early and  prevent illness, disease and health-related problems.    Medicare offers a yearly Wellness Visit which allows our clinical staff to assess your need for preventative services including immunizations, lifestyle education, counseling to decrease risk of preventable diseases and screening for fall risk and other medical concerns.    This visit is provided free of charge (no copay) for all Medicare recipients. The clinical pharmacists at [facility] have begun to conduct these Wellness Visits which will also include a thorough review of all your medications.    As you primary medical provider recommend that you make an appointment for your Annual Wellness Visit if you have not done so already this year.  You may set up this appointment before you leave today or you may call back ([phone]) and schedule an appointment.  Please make sure when you call that you mention that you are scheduling your Annual Wellness Visit with the clinical pharmacist so that the appointment may be made for the proper length of time.     Continue current medications. Continue good therapeutic lifestyle changes which include good diet and exercise. Fall precautions discussed with patient. If an FOBT was given today- please return it to our front desk. If you are over 62 years old - you may need Prevnar 66 or the adult Pneumonia vaccine.  **Flu shots are available--- please call and schedule a FLU-CLINIC appointment**  After your visit with [place] today you will receive a survey in the mail or online from [employer] regarding your care with [place]. Please take a moment to fill this out. Your feedback is very important to [place] as you can help [place] better understand your patient needs as well as improve your experience and satisfaction. WE CARE ABOUT YOU!!!   The flu shot that you received a day may make your arm sore Follow-up with endocrinology as planned Continue to monitor blood sugars closely    Arrie Senate MD

## 2016-10-27 NOTE — Patient Instructions (Addendum)
Medicare Annual Wellness Visit  [facility] and the medical providers at [facility] strive to bring you the best medical care.  In doing so we not only want to address your current medical conditions and concerns but also to detect new conditions early and prevent illness, disease and health-related problems.    Medicare offers a yearly Wellness Visit which allows our clinical staff to assess your need for preventative services including immunizations, lifestyle education, counseling to decrease risk of preventable diseases and screening for fall risk and other medical concerns.    This visit is provided free of charge (no copay) for all Medicare recipients. The clinical pharmacists at [facility] have begun to conduct these Wellness Visits which will also include a thorough review of all your medications.    As you primary medical provider recommend that you make an appointment for your Annual Wellness Visit if you have not done so already this year.  You may set up this appointment before you leave today or you may call back ([phone]) and schedule an appointment.  Please make sure when you call that you mention that you are scheduling your Annual Wellness Visit with the clinical pharmacist so that the appointment may be made for the proper length of time.     Continue current medications. Continue good therapeutic lifestyle changes which include good diet and exercise. Fall precautions discussed with patient. If an FOBT was given today- please return it to our front desk. If you are over 65 years old - you may need Prevnar 28 or the adult Pneumonia vaccine.  **Flu shots are available--- please call and schedule a FLU-CLINIC appointment**  After your visit with [place] today you will receive a survey in the mail or online from [employer] regarding your care with [place]. Please take a moment to fill this out. Your feedback is very  important to [place] as you can help [place] better understand your patient needs as well as improve your experience and satisfaction. WE CARE ABOUT YOU!!!   The flu shot that you received a day may make your arm sore Follow-up with endocrinology as planned Continue to monitor blood sugars closely

## 2016-12-10 ENCOUNTER — Ambulatory Visit: Payer: Medicare Other | Admitting: Family Medicine

## 2016-12-12 ENCOUNTER — Ambulatory Visit (INDEPENDENT_AMBULATORY_CARE_PROVIDER_SITE_OTHER): Payer: Medicare Other | Admitting: Pharmacist Clinician (PhC)/ Clinical Pharmacy Specialist

## 2016-12-12 DIAGNOSIS — I482 Chronic atrial fibrillation, unspecified: Secondary | ICD-10-CM

## 2016-12-12 DIAGNOSIS — D6859 Other primary thrombophilia: Secondary | ICD-10-CM

## 2016-12-12 DIAGNOSIS — Z86718 Personal history of other venous thrombosis and embolism: Secondary | ICD-10-CM | POA: Diagnosis not present

## 2016-12-12 DIAGNOSIS — I4891 Unspecified atrial fibrillation: Secondary | ICD-10-CM | POA: Diagnosis not present

## 2016-12-12 LAB — COAGUCHEK XS/INR WAIVED
INR: 3.8 — ABNORMAL HIGH (ref 0.9–1.1)
Prothrombin Time: 45.4 s

## 2016-12-12 NOTE — Patient Instructions (Signed)
Description   No warfarin today then follow the following directions: - take 1 and 1/2 tablets daily except 1 tablet on Wednesdays and Saturdays.  INR was 3.8 today

## 2016-12-15 DIAGNOSIS — E1165 Type 2 diabetes mellitus with hyperglycemia: Secondary | ICD-10-CM | POA: Diagnosis not present

## 2016-12-15 DIAGNOSIS — I10 Essential (primary) hypertension: Secondary | ICD-10-CM | POA: Diagnosis not present

## 2016-12-15 DIAGNOSIS — E7800 Pure hypercholesterolemia, unspecified: Secondary | ICD-10-CM | POA: Diagnosis not present

## 2016-12-15 DIAGNOSIS — E162 Hypoglycemia, unspecified: Secondary | ICD-10-CM | POA: Diagnosis not present

## 2016-12-19 ENCOUNTER — Other Ambulatory Visit: Payer: Self-pay | Admitting: Family Medicine

## 2017-01-16 ENCOUNTER — Ambulatory Visit (INDEPENDENT_AMBULATORY_CARE_PROVIDER_SITE_OTHER): Payer: Medicare Other | Admitting: Pharmacist Clinician (PhC)/ Clinical Pharmacy Specialist

## 2017-01-16 DIAGNOSIS — Z86718 Personal history of other venous thrombosis and embolism: Secondary | ICD-10-CM | POA: Diagnosis not present

## 2017-01-16 DIAGNOSIS — I482 Chronic atrial fibrillation, unspecified: Secondary | ICD-10-CM

## 2017-01-16 DIAGNOSIS — I4891 Unspecified atrial fibrillation: Secondary | ICD-10-CM | POA: Diagnosis not present

## 2017-01-16 DIAGNOSIS — D6859 Other primary thrombophilia: Secondary | ICD-10-CM | POA: Diagnosis not present

## 2017-01-16 LAB — COAGUCHEK XS/INR WAIVED
INR: 2.4 — ABNORMAL HIGH (ref 0.9–1.1)
Prothrombin Time: 28.5 s

## 2017-01-16 NOTE — Patient Instructions (Signed)
Description   Continue taking warfarin the same way  INR was 2.4 today

## 2017-01-22 ENCOUNTER — Other Ambulatory Visit: Payer: Self-pay | Admitting: Family Medicine

## 2017-01-26 ENCOUNTER — Other Ambulatory Visit: Payer: Self-pay | Admitting: Family Medicine

## 2017-01-28 NOTE — Telephone Encounter (Signed)
Last seen 10/27/16  DWM

## 2017-02-25 ENCOUNTER — Other Ambulatory Visit: Payer: Self-pay | Admitting: Pulmonary Disease

## 2017-02-25 DIAGNOSIS — Z77090 Contact with and (suspected) exposure to asbestos: Secondary | ICD-10-CM

## 2017-02-26 ENCOUNTER — Ambulatory Visit: Payer: Worker's Compensation | Admitting: Pulmonary Disease

## 2017-02-26 DIAGNOSIS — Z77090 Contact with and (suspected) exposure to asbestos: Secondary | ICD-10-CM

## 2017-02-26 LAB — PULMONARY FUNCTION TEST
DL/VA % PRED: 99 %
DL/VA: 4.63 ml/min/mmHg/L
DLCO UNC % PRED: 96 %
DLCO cor % pred: 98 %
DLCO cor: 33.13 ml/min/mmHg
DLCO unc: 32.56 ml/min/mmHg
FEF 25-75 PRE: 3.08 L/s
FEF 25-75 Post: 3.76 L/sec
FEF2575-%CHANGE-POST: 21 %
FEF2575-%PRED-POST: 135 %
FEF2575-%Pred-Pre: 111 %
FEV1-%CHANGE-POST: 3 %
FEV1-%PRED-PRE: 109 %
FEV1-%Pred-Post: 113 %
FEV1-PRE: 3.85 L
FEV1-Post: 3.99 L
FEV1FVC-%CHANGE-POST: 2 %
FEV1FVC-%Pred-Pre: 103 %
FEV6-%CHANGE-POST: 0 %
FEV6-%PRED-POST: 112 %
FEV6-%Pred-Pre: 111 %
FEV6-PRE: 5.02 L
FEV6-Post: 5.06 L
FEV6FVC-%Change-Post: 0 %
FEV6FVC-%PRED-PRE: 105 %
FEV6FVC-%Pred-Post: 105 %
FVC-%CHANGE-POST: 0 %
FVC-%PRED-POST: 106 %
FVC-%Pred-Pre: 106 %
FVC-Post: 5.07 L
FVC-Pre: 5.03 L
POST FEV1/FVC RATIO: 79 %
POST FEV6/FVC RATIO: 100 %
PRE FEV6/FVC RATIO: 100 %
Pre FEV1/FVC ratio: 77 %
RV % PRED: 82 %
RV: 1.99 L
TLC % pred: 97 %
TLC: 7.04 L

## 2017-02-27 ENCOUNTER — Ambulatory Visit (INDEPENDENT_AMBULATORY_CARE_PROVIDER_SITE_OTHER): Payer: Medicare Other | Admitting: Pharmacist Clinician (PhC)/ Clinical Pharmacy Specialist

## 2017-02-27 DIAGNOSIS — I4891 Unspecified atrial fibrillation: Secondary | ICD-10-CM | POA: Diagnosis not present

## 2017-02-27 DIAGNOSIS — D6859 Other primary thrombophilia: Secondary | ICD-10-CM

## 2017-02-27 DIAGNOSIS — I482 Chronic atrial fibrillation, unspecified: Secondary | ICD-10-CM

## 2017-02-27 DIAGNOSIS — Z86718 Personal history of other venous thrombosis and embolism: Secondary | ICD-10-CM

## 2017-02-27 LAB — COAGUCHEK XS/INR WAIVED
INR: 2.7 — AB (ref 0.9–1.1)
PROTHROMBIN TIME: 32.7 s

## 2017-02-27 NOTE — Patient Instructions (Signed)
Description   Continue taking warfarin the same way  INR was 2.7 today

## 2017-03-05 ENCOUNTER — Telehealth: Payer: Self-pay | Admitting: Pulmonary Disease

## 2017-03-05 DIAGNOSIS — Z77090 Contact with and (suspected) exposure to asbestos: Secondary | ICD-10-CM

## 2017-03-05 NOTE — Telephone Encounter (Signed)
CXR order placed. Pt notified.

## 2017-03-05 NOTE — Telephone Encounter (Signed)
Yes, reason asbestos exposure

## 2017-03-05 NOTE — Telephone Encounter (Signed)
BQ pt was wanting to see if he could come in for his yearly cxr.  He stated that he normally does this every year, but did not get one at his last OV.  His last cxr was 09/2015 in the system.  Please advise if you want to order this for the pt.  Thanks  Allergies  Allergen Reactions  . Januvia [Sitagliptin] Hives

## 2017-03-09 ENCOUNTER — Ambulatory Visit (INDEPENDENT_AMBULATORY_CARE_PROVIDER_SITE_OTHER)
Admission: RE | Admit: 2017-03-09 | Discharge: 2017-03-09 | Disposition: A | Discharge status: Home / Self Care | Payer: Worker's Compensation | Source: Ambulatory Visit | Attending: Pulmonary Disease | Admitting: Pulmonary Disease

## 2017-03-09 DIAGNOSIS — Z77090 Contact with and (suspected) exposure to asbestos: Secondary | ICD-10-CM

## 2017-03-25 DIAGNOSIS — H9041 Sensorineural hearing loss, unilateral, right ear, with unrestricted hearing on the contralateral side: Secondary | ICD-10-CM | POA: Insufficient documentation

## 2017-03-25 DIAGNOSIS — IMO0001 Reserved for inherently not codable concepts without codable children: Secondary | ICD-10-CM | POA: Insufficient documentation

## 2017-03-25 DIAGNOSIS — H9313 Tinnitus, bilateral: Secondary | ICD-10-CM | POA: Insufficient documentation

## 2017-03-26 ENCOUNTER — Telehealth: Payer: Self-pay | Admitting: Family Medicine

## 2017-03-26 NOTE — Telephone Encounter (Signed)
Samples up front - pt aware  ?

## 2017-04-06 ENCOUNTER — Telehealth: Payer: Self-pay | Admitting: Family Medicine

## 2017-04-06 ENCOUNTER — Other Ambulatory Visit: Payer: Self-pay | Admitting: *Deleted

## 2017-04-06 DIAGNOSIS — N400 Enlarged prostate without lower urinary tract symptoms: Secondary | ICD-10-CM

## 2017-04-06 DIAGNOSIS — E7849 Other hyperlipidemia: Secondary | ICD-10-CM

## 2017-04-06 DIAGNOSIS — I482 Chronic atrial fibrillation, unspecified: Secondary | ICD-10-CM

## 2017-04-06 DIAGNOSIS — I480 Paroxysmal atrial fibrillation: Secondary | ICD-10-CM

## 2017-04-06 DIAGNOSIS — E119 Type 2 diabetes mellitus without complications: Secondary | ICD-10-CM

## 2017-04-06 DIAGNOSIS — I10 Essential (primary) hypertension: Secondary | ICD-10-CM

## 2017-04-06 DIAGNOSIS — E559 Vitamin D deficiency, unspecified: Secondary | ICD-10-CM

## 2017-04-06 DIAGNOSIS — Z794 Long term (current) use of insulin: Principal | ICD-10-CM

## 2017-04-06 NOTE — Telephone Encounter (Signed)
Patient aware he will need to come in for labs before his appointment.  Christopher Avery has entered lab orders.

## 2017-04-07 ENCOUNTER — Other Ambulatory Visit: Payer: Medicare Other

## 2017-04-07 DIAGNOSIS — I482 Chronic atrial fibrillation, unspecified: Secondary | ICD-10-CM

## 2017-04-07 DIAGNOSIS — I10 Essential (primary) hypertension: Secondary | ICD-10-CM

## 2017-04-07 DIAGNOSIS — E7849 Other hyperlipidemia: Secondary | ICD-10-CM

## 2017-04-07 DIAGNOSIS — Z794 Long term (current) use of insulin: Principal | ICD-10-CM

## 2017-04-07 DIAGNOSIS — I480 Paroxysmal atrial fibrillation: Secondary | ICD-10-CM

## 2017-04-07 DIAGNOSIS — E559 Vitamin D deficiency, unspecified: Secondary | ICD-10-CM

## 2017-04-07 DIAGNOSIS — E119 Type 2 diabetes mellitus without complications: Secondary | ICD-10-CM

## 2017-04-07 DIAGNOSIS — N400 Enlarged prostate without lower urinary tract symptoms: Secondary | ICD-10-CM

## 2017-04-07 LAB — BAYER DCA HB A1C WAIVED: HB A1C: 7.6 % — AB (ref <7.0)

## 2017-04-08 LAB — CBC WITH DIFFERENTIAL/PLATELET
Basophils Absolute: 0 10*3/uL (ref 0.0–0.2)
Basos: 0 %
EOS (ABSOLUTE): 0.1 10*3/uL (ref 0.0–0.4)
EOS: 2 %
HEMATOCRIT: 45.8 % (ref 37.5–51.0)
HEMOGLOBIN: 14.8 g/dL (ref 13.0–17.7)
Immature Grans (Abs): 0 10*3/uL (ref 0.0–0.1)
Immature Granulocytes: 0 %
Lymphocytes Absolute: 1.5 10*3/uL (ref 0.7–3.1)
Lymphs: 28 %
MCH: 30.4 pg (ref 26.6–33.0)
MCHC: 32.3 g/dL (ref 31.5–35.7)
MCV: 94 fL (ref 79–97)
MONOCYTES: 5 %
Monocytes Absolute: 0.3 10*3/uL (ref 0.1–0.9)
NEUTROS ABS: 3.4 10*3/uL (ref 1.4–7.0)
Neutrophils: 65 %
Platelets: 197 10*3/uL (ref 150–379)
RBC: 4.87 x10E6/uL (ref 4.14–5.80)
RDW: 13.2 % (ref 12.3–15.4)
WBC: 5.3 10*3/uL (ref 3.4–10.8)

## 2017-04-08 LAB — VITAMIN D 25 HYDROXY (VIT D DEFICIENCY, FRACTURES): VIT D 25 HYDROXY: 35 ng/mL (ref 30.0–100.0)

## 2017-04-08 LAB — BMP8+EGFR
BUN / CREAT RATIO: 21 (ref 10–24)
BUN: 20 mg/dL (ref 8–27)
CO2: 25 mmol/L (ref 20–29)
CREATININE: 0.97 mg/dL (ref 0.76–1.27)
Calcium: 9.5 mg/dL (ref 8.6–10.2)
Chloride: 100 mmol/L (ref 96–106)
GFR calc non Af Amer: 81 mL/min/{1.73_m2} (ref >59)
GFR, EST AFRICAN AMERICAN: 94 mL/min/{1.73_m2} (ref >59)
Glucose: 261 mg/dL — ABNORMAL HIGH (ref 65–99)
Potassium: 4.4 mmol/L (ref 3.5–5.2)
Sodium: 139 mmol/L (ref 134–144)

## 2017-04-08 LAB — HEPATIC FUNCTION PANEL
ALT: 17 IU/L (ref 0–44)
AST: 21 IU/L (ref 0–40)
Albumin: 4.6 g/dL (ref 3.6–4.8)
Alkaline Phosphatase: 60 IU/L (ref 39–117)
BILIRUBIN, DIRECT: 0.19 mg/dL (ref 0.00–0.40)
Bilirubin Total: 0.8 mg/dL (ref 0.0–1.2)
TOTAL PROTEIN: 7.2 g/dL (ref 6.0–8.5)

## 2017-04-08 LAB — LIPID PANEL
CHOLESTEROL TOTAL: 146 mg/dL (ref 100–199)
Chol/HDL Ratio: 2.9 ratio (ref 0.0–5.0)
HDL: 51 mg/dL (ref >39)
LDL Calculated: 83 mg/dL (ref 0–99)
Triglycerides: 61 mg/dL (ref 0–149)
VLDL CHOLESTEROL CAL: 12 mg/dL (ref 5–40)

## 2017-04-09 ENCOUNTER — Encounter: Payer: Self-pay | Admitting: *Deleted

## 2017-04-09 ENCOUNTER — Ambulatory Visit (INDEPENDENT_AMBULATORY_CARE_PROVIDER_SITE_OTHER): Payer: Medicare Other | Admitting: *Deleted

## 2017-04-09 VITALS — BP 116/80 | HR 70 | Ht 70.5 in | Wt 164.0 lb

## 2017-04-09 DIAGNOSIS — D6859 Other primary thrombophilia: Secondary | ICD-10-CM

## 2017-04-09 DIAGNOSIS — Z794 Long term (current) use of insulin: Secondary | ICD-10-CM

## 2017-04-09 DIAGNOSIS — Z0000 Encounter for general adult medical examination without abnormal findings: Secondary | ICD-10-CM | POA: Diagnosis not present

## 2017-04-09 DIAGNOSIS — Z86718 Personal history of other venous thrombosis and embolism: Secondary | ICD-10-CM

## 2017-04-09 DIAGNOSIS — I482 Chronic atrial fibrillation, unspecified: Secondary | ICD-10-CM

## 2017-04-09 DIAGNOSIS — E119 Type 2 diabetes mellitus without complications: Secondary | ICD-10-CM

## 2017-04-09 DIAGNOSIS — I480 Paroxysmal atrial fibrillation: Secondary | ICD-10-CM

## 2017-04-09 LAB — COAGUCHEK XS/INR WAIVED
INR: 3.7 — AB (ref 0.9–1.1)
Prothrombin Time: 44.4 s

## 2017-04-09 NOTE — Patient Instructions (Signed)
  [name] , Thank you for taking time to come for your Medicare Wellness Visit. I appreciate your ongoing commitment to your health goals. Please review the following plan we discussed and let me know if I can assist you in the future.   These are the goals we discussed: Continue to stay physically active for at least 150 minutes a week  This is a list of the screening recommended for you and due dates:  Health Maintenance  Topic Date Due  . Urine Protein Check  01/22/2017  .  Hepatitis C: One time screening is recommended by Center for Disease Control  (CDC) for  adults born from [birth year] through 1964.   11/13/2021*  . Hemoglobin A1C  10/08/2017  . Eye exam for diabetics  10/24/2017  . Complete foot exam   10/27/2017  . Pneumonia vaccines (2 of 2 - PPSV23) 12/04/2017  . Colon Cancer Screening  02/11/2018  . Tetanus Vaccine  07/11/2024  . Flu Shot  Completed  *Topic was postponed. The date shown is not the original due date.
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
## 2017-04-10 ENCOUNTER — Encounter: Payer: Self-pay | Admitting: Pharmacist Clinician (PhC)/ Clinical Pharmacy Specialist

## 2017-04-29 ENCOUNTER — Ambulatory Visit: Payer: Medicare Other | Admitting: Family Medicine

## 2017-05-04 ENCOUNTER — Other Ambulatory Visit: Payer: Self-pay | Admitting: *Deleted

## 2017-05-04 MED ORDER — FLUTICASONE PROPIONATE 50 MCG/ACT NA SUSP: 2.0000 | Freq: Every day | NASAL | 0 refills | Status: DC

## 2017-05-08 ENCOUNTER — Ambulatory Visit (INDEPENDENT_AMBULATORY_CARE_PROVIDER_SITE_OTHER): Payer: Medicare Other | Admitting: Pharmacist Clinician (PhC)/ Clinical Pharmacy Specialist

## 2017-05-08 DIAGNOSIS — I4891 Unspecified atrial fibrillation: Secondary | ICD-10-CM

## 2017-05-08 DIAGNOSIS — Z86718 Personal history of other venous thrombosis and embolism: Secondary | ICD-10-CM | POA: Diagnosis not present

## 2017-05-08 DIAGNOSIS — I482 Chronic atrial fibrillation, unspecified: Secondary | ICD-10-CM

## 2017-05-08 DIAGNOSIS — D6859 Other primary thrombophilia: Secondary | ICD-10-CM

## 2017-05-08 LAB — COAGUCHEK XS/INR WAIVED
INR: 3.1 — ABNORMAL HIGH (ref 0.9–1.1)
PROTHROMBIN TIME: 37.4 s

## 2017-05-08 NOTE — Patient Instructions (Signed)
Description   Change warfarin directions to 1 tablet Mondays, Wednesdays, and Saturdays and 1 1/2 tablets all other days of the week.   INR was 3.1

## 2017-05-12 ENCOUNTER — Telehealth: Payer: Self-pay | Admitting: Family Medicine

## 2017-05-12 MED ORDER — SCOPOLAMINE 1 MG/3DAYS TD PT72: 1.0000 | MEDICATED_PATCH | TRANSDERMAL | 0 refills | Status: DC

## 2017-05-12 NOTE — Telephone Encounter (Signed)
Pt wanting scopolamine sent to [facility] Please advise

## 2017-05-12 NOTE — Telephone Encounter (Signed)
Pt aware of med sent to pharm

## 2017-05-14 ENCOUNTER — Encounter: Payer: Self-pay | Admitting: *Deleted

## 2017-05-19 ENCOUNTER — Telehealth: Payer: Self-pay | Admitting: Family Medicine

## 2017-05-19 NOTE — Telephone Encounter (Signed)
Patient aware that samples are ready for pick up 

## 2017-05-21 ENCOUNTER — Other Ambulatory Visit: Payer: Self-pay | Admitting: Family Medicine

## 2017-05-26 ENCOUNTER — Other Ambulatory Visit: Payer: Self-pay | Admitting: Family Medicine

## 2017-06-05 ENCOUNTER — Ambulatory Visit: Payer: Self-pay | Admitting: Family Medicine

## 2017-06-18 ENCOUNTER — Ambulatory Visit: Payer: Self-pay | Admitting: Family Medicine

## 2017-06-19 ENCOUNTER — Encounter: Payer: Self-pay | Admitting: Pharmacist Clinician (PhC)/ Clinical Pharmacy Specialist

## 2017-06-22 ENCOUNTER — Other Ambulatory Visit: Payer: Self-pay | Admitting: Family Medicine

## 2017-06-23 ENCOUNTER — Encounter: Payer: Self-pay | Admitting: Family Medicine

## 2017-06-23 ENCOUNTER — Ambulatory Visit (INDEPENDENT_AMBULATORY_CARE_PROVIDER_SITE_OTHER): Payer: Medicare Other | Admitting: Family Medicine

## 2017-06-23 VITALS — BP 121/77 | HR 83 | Temp 97.1°F | Ht 70.5 in | Wt 157.0 lb

## 2017-06-23 DIAGNOSIS — E559 Vitamin D deficiency, unspecified: Secondary | ICD-10-CM

## 2017-06-23 DIAGNOSIS — E1169 Type 2 diabetes mellitus with other specified complication: Secondary | ICD-10-CM

## 2017-06-23 DIAGNOSIS — I480 Paroxysmal atrial fibrillation: Secondary | ICD-10-CM

## 2017-06-23 DIAGNOSIS — E119 Type 2 diabetes mellitus without complications: Secondary | ICD-10-CM | POA: Diagnosis not present

## 2017-06-23 DIAGNOSIS — C439 Malignant melanoma of skin, unspecified: Secondary | ICD-10-CM | POA: Diagnosis not present

## 2017-06-23 DIAGNOSIS — E7849 Other hyperlipidemia: Secondary | ICD-10-CM | POA: Diagnosis not present

## 2017-06-23 DIAGNOSIS — E785 Hyperlipidemia, unspecified: Secondary | ICD-10-CM

## 2017-06-23 DIAGNOSIS — N400 Enlarged prostate without lower urinary tract symptoms: Secondary | ICD-10-CM

## 2017-06-23 DIAGNOSIS — I10 Essential (primary) hypertension: Secondary | ICD-10-CM

## 2017-06-23 DIAGNOSIS — N5201 Erectile dysfunction due to arterial insufficiency: Secondary | ICD-10-CM

## 2017-06-23 DIAGNOSIS — Z86718 Personal history of other venous thrombosis and embolism: Secondary | ICD-10-CM

## 2017-06-23 DIAGNOSIS — Z794 Long term (current) use of insulin: Secondary | ICD-10-CM

## 2017-06-23 DIAGNOSIS — I482 Chronic atrial fibrillation, unspecified: Secondary | ICD-10-CM

## 2017-06-23 DIAGNOSIS — D6859 Other primary thrombophilia: Secondary | ICD-10-CM

## 2017-06-23 LAB — COAGUCHEK XS/INR WAIVED
INR: 3.5 — ABNORMAL HIGH (ref 0.9–1.1)
Prothrombin Time: 42.5 s

## 2017-06-23 NOTE — Progress Notes (Signed)
Subjective:    [name]: Christopher Avery, male    DOB: [birth year], 67 y.o.   MRN: [id number]  HPI Pt here for follow up and management of chronic medical problems which includes diabetes, hypertension and hyperlipidemia. He is taking medications regularly.  Christopher Avery is doing well and has no complaints today.  He just got back from a trip.  He is due to return in FOBT get lab work done but it is too soon and future orders will be placed he will get a urine microalbumin and will make sure that we send a copy of this to Dr. Chalmers Cater.  He is also due to get a colonoscopy early next year.  His weight is good his vital signs are stable.  Patient denies any chest pain pressure tightness or shortness of breath.  He denies any trouble with swallowing heartburn indigestion nausea vomiting diarrhea blood in the stool or black tarry bowel movements.  He is passing his water without problems.  He sees the endocrinologist in June.  On his trip he had a little problem with his blood sugar but did well overall with managing this.  He stays active physically.    Patient Active Problem List   Diagnosis Date Noted  . Asymmetrical right sensorineural hearing loss 03/25/2017  . Subjective tinnitus of both ears 03/25/2017  . Primary hypercoagulable state ([place]) [D68.59] 03/04/2016  . History of DVT of lower extremity 03/04/2016  . Melanoma of skin ([place]) 11/14/2015  . Chronic atrial fibrillation ([place]) 07/04/2015  . Incisional hernia, without obstruction or gangrene 11/29/2013  . Vitamin D deficiency 11/29/2013  . Type 2 diabetes mellitus treated with insulin ([place]) 07/29/2012  . Long term (current) use of anticoagulants 04/22/2012  . Hyperlipidemia 06/23/2008  . Benign essential HTN 06/23/2008  . Paroxysmal atrial fibrillation ([place]) 06/23/2008  . Asbestos exposure 06/23/2008   Outpatient Encounter Medications as of 06/23/2017  Medication Sig  . atorvastatin (LIPITOR) 20 MG tablet Take 5 mg by mouth daily.  [name]  atorvastatin (LIPITOR) 20 MG tablet TAKE 1 TABLET BY MOUTH ONCE A DAY  . BD PEN NEEDLE NANO U/F 32G X 4 MM MISC USE WITH INSULIN 4 TIMES A DAY  . co-enzyme Q-10 30 MG capsule Take 100 mg by mouth daily.   [name] diltiazem (CARDIZEM CD) 180 MG 24 hr capsule Take 1 capsule (180 mg total) by mouth daily.  . fluticasone (FLONASE) 50 MCG/ACT nasal spray Place 2 sprays into both nostrils daily.  . insulin aspart (NOVOLOG FLEXPEN) 100 UNIT/ML FlexPen Inject 2 Units into the skin 2 (two) times daily with a meal. Use according to sliding scale given in office (Patient taking differently: Inject 2-5 Units into the skin 3 (three) times daily with meals. Use according to sliding scale given in office)  . Insulin Glargine (BASAGLAR KWIKPEN) 100 UNIT/ML SOPN Inject 17 Units into the skin every evening.   [name] NOVOLOG FLEXPEN 100 UNIT/ML FlexPen INJECT 2 UNITS INTO THE SKIN TWICE DAILY WITH A MEAL. USE ACCORDING TO SLIDING SCALE GIVEN IN OFFICE  . scopolamine (TRANSDERM-SCOP, 1.5 MG,) 1 MG/3DAYS Place 1 patch (1.5 mg total) onto the skin every 3 (three) days.  . sildenafil (REVATIO) 20 MG tablet TAKE 2-5 TABLETS AS NEEDED PRIOR TO SEXUAL ACTIVITY  . sotalol (BETAPACE) 80 MG tablet Take 1 tablet (80 mg total) by mouth 2 (two) times daily.  . valACYclovir (VALTREX) 500 MG tablet TAKE 4 TABLETS BY MOUTH TWICE DAILY AS DIRECTED AND AS NEEDED  . warfarin (  COUMADIN) 2.5 MG tablet TAKE 1 TO 1 AND 1/2 TABLETS DAILY AS DIRECTED   No facility-administered encounter medications on file as of 06/23/2017.      Review of Systems  Constitutional: Negative.   HENT: Negative.   Eyes: Negative.   Respiratory: Negative.   Cardiovascular: Negative.   Gastrointestinal: Negative.   Endocrine: Negative.   Genitourinary: Negative.   Musculoskeletal: Negative.   Skin: Negative.   Allergic/Immunologic: Negative.   Neurological: Negative.   Hematological: Negative.   Psychiatric/Behavioral: Negative.        Objective:   Physical  Exam  Constitutional: He is oriented to person, place, and time. He appears well-developed and well-nourished. No distress.  The patient is pleasant and alert and feeling as well as he is ever felt.  HENT:  Head: Normocephalic and atraumatic.  Right Ear: External ear normal.  Left Ear: External ear normal.  Mouth/Throat: Oropharynx is clear and moist. No oropharyngeal exudate.  Slight nasal congestion bilaterally  Eyes: Pupils are equal, round, and reactive to light. Conjunctivae and EOM are normal. Right eye exhibits no discharge. Left eye exhibits no discharge. No scleral icterus.  Sees ophthalmology regularly in August, Dr. Carolynn Sayers  Neck: Normal range of motion. Neck supple. No thyromegaly present.  No bruits thyromegaly or anterior cervical adenopathy  Cardiovascular: Normal rate, normal heart sounds and intact distal pulses.  No murmur heard. Heart is irregular irregular at 72/min  Pulmonary/Chest: Effort normal and breath sounds normal. He has no wheezes. He has no rales. He exhibits no tenderness.  Clear anteriorly and posteriorly  Abdominal: Soft. Bowel sounds are normal. He exhibits no mass. There is no tenderness.  No organ enlargement tenderness masses or bruits  Musculoskeletal: Normal range of motion. He exhibits no edema or tenderness.  Lymphadenopathy:    He has no cervical adenopathy.  Neurological: He is alert and oriented to person, place, and time. He has normal reflexes. No cranial nerve deficit.  Skin: Skin is warm and dry. No rash noted.  Psychiatric: He has a normal mood and affect. His behavior is normal. Judgment and thought content normal.  The patient has normal affect mood and behavior  Nursing note and vitals reviewed.   BP 121/77 (BP Location: Left Arm)   Pulse 83   Temp (!) 97.1 F (36.2 C) (Oral)   Ht 5' 10.5" (1.791 m)   Wt 157 lb (71.2 kg)   BMI 22.21 kg/m         Assessment & Plan:  1. Type 2 diabetes mellitus treated with insulin  ([place]) -Follow-up with endocrinology  2. Benign essential HTN -Blood pressure is good he will continue with current treatment - CBC with Differential/Platelet; Future - BMP8+EGFR; Future - Hepatic function panel; Future  3. Other hyperlipidemia -Continue with current treatment and aggressive therapeutic lifestyle changes pending results of lab work - CBC with Differential/Platelet; Future - Lipid panel; Future  4. Vitamin D deficiency -Continue with vitamin D replacement pending results of lab work - CBC with Differential/Platelet; Future - VITAMIN D 25 Hydroxy (Vit-D Deficiency, Fractures); Future  5. Benign prostatic hyperplasia, unspecified whether lower urinary tract symptoms present -No complaints today with voiding and patient will continue with drinking plenty of fluids and stay well-hydrated. - CBC with Differential/Platelet; Future  6. Paroxysmal atrial fibrillation ([facility]) -Continue with Coumadin and follow-up with Dr. Alvester Chou as planned - CBC with Differential/Platelet; Future  7. Primary hypercoagulable state ([place]) -Continue with Coumadin and get protimes checked regularly - CBC with Differential/Platelet; Future  8. Melanoma of skin ([place]) -Follow-up with dermatology as planned - CBC with Differential/Platelet; Future  9. Erectile dysfunction due to arterial insufficiency -No complaints today with this problem - CBC with Differential/Platelet; Future  10. Hyperlipidemia associated with type 2 diabetes mellitus ([place]) -Continue with atorvastatin and as aggressive therapeutic lifestyle changes as possible - CBC with Differential/Platelet; Future  Patient Instructions                       Medicare Annual Wellness Visit  [facility] and the medical providers at [facility] strive to bring you the best medical care.  In doing so we not only want to address your current medical conditions and concerns but also to detect new conditions early and  prevent illness, disease and health-related problems.    Medicare offers a yearly Wellness Visit which allows our clinical staff to assess your need for preventative services including immunizations, lifestyle education, counseling to decrease risk of preventable diseases and screening for fall risk and other medical concerns.    This visit is provided free of charge (no copay) for all Medicare recipients. The clinical pharmacists at [facility] have begun to conduct these Wellness Visits which will also include a thorough review of all your medications.    As you primary medical provider recommend that you make an appointment for your Annual Wellness Visit if you have not done so already this year.  You may set up this appointment before you leave today or you may call back ([phone]) and schedule an appointment.  Please make sure when you call that you mention that you are scheduling your Annual Wellness Visit with the clinical pharmacist so that the appointment may be made for the proper length of time.     Continue current medications. Continue good therapeutic lifestyle changes which include good diet and exercise. Fall precautions discussed with patient. If an FOBT was given today- please return it to our front desk. If you are over 50 years old - you may need Prevnar 70 or the adult Pneumonia vaccine.  **Flu shots are available--- please call and schedule a FLU-CLINIC appointment**  After your visit with [place] today you will receive a survey in the mail or online from [employer] regarding your care with [place]. Please take a moment to fill this out. Your feedback is very important to [place] as you can help [place] better understand your patient needs as well as improve your experience and satisfaction. WE CARE ABOUT YOU!!!   Continue to follow-up with cardiology endocrinology pulmonology as planned Also follow-up with audiology as planned, Dr. Redmond Baseman Check feet regularly Monitor  sugars regularly as currently doing Do not forget to get your colonoscopy early next year    Arrie Senate MD

## 2017-06-23 NOTE — Addendum Note (Signed)
Addended by: Zannie Cove on: 06/23/2017 02:40 PM   Modules accepted: Orders

## 2017-06-23 NOTE — Patient Instructions (Addendum)
Medicare Annual Wellness Visit  [facility] and the medical providers at [facility] strive to bring you the best medical care.  In doing so we not only want to address your current medical conditions and concerns but also to detect new conditions early and prevent illness, disease and health-related problems.    Medicare offers a yearly Wellness Visit which allows our clinical staff to assess your need for preventative services including immunizations, lifestyle education, counseling to decrease risk of preventable diseases and screening for fall risk and other medical concerns.    This visit is provided free of charge (no copay) for all Medicare recipients. The clinical pharmacists at [facility] have begun to conduct these Wellness Visits which will also include a thorough review of all your medications.    As you primary medical provider recommend that you make an appointment for your Annual Wellness Visit if you have not done so already this year.  You may set up this appointment before you leave today or you may call back ([phone]) and schedule an appointment.  Please make sure when you call that you mention that you are scheduling your Annual Wellness Visit with the clinical pharmacist so that the appointment may be made for the proper length of time.     Continue current medications. Continue good therapeutic lifestyle changes which include good diet and exercise. Fall precautions discussed with patient. If an FOBT was given today- please return it to our front desk. If you are over 63 years old - you may need Prevnar 60 or the adult Pneumonia vaccine.  **Flu shots are available--- please call and schedule a FLU-CLINIC appointment**  After your visit with [place] today you will receive a survey in the mail or online from [employer] regarding your care with [place]. Please take a moment to fill this out. Your feedback is very  important to [place] as you can help [place] better understand your patient needs as well as improve your experience and satisfaction. WE CARE ABOUT YOU!!!   Continue to follow-up with cardiology endocrinology pulmonology as planned Also follow-up with audiology as planned, Dr. Redmond Baseman Check feet regularly Monitor sugars regularly as currently doing Do not forget to get your colonoscopy early next year

## 2017-06-26 ENCOUNTER — Encounter: Payer: Self-pay | Admitting: Pharmacist Clinician (PhC)/ Clinical Pharmacy Specialist

## 2017-06-26 ENCOUNTER — Encounter: Payer: Medicare Other | Admitting: Pharmacist Clinician (PhC)/ Clinical Pharmacy Specialist

## 2017-06-27 IMAGING — CR DG CHEST 2V
3 series · 3 of 3 positions shown · non-contrast
Comparison: 07/13/2013

CLINICAL DATA: Asbestos exposure, history diabetes mellitus, atrial
fibrillation, hypertension, hyperlipidemia

EXAM:
CHEST  2 VIEW

[view not recorded (1 of 3)]
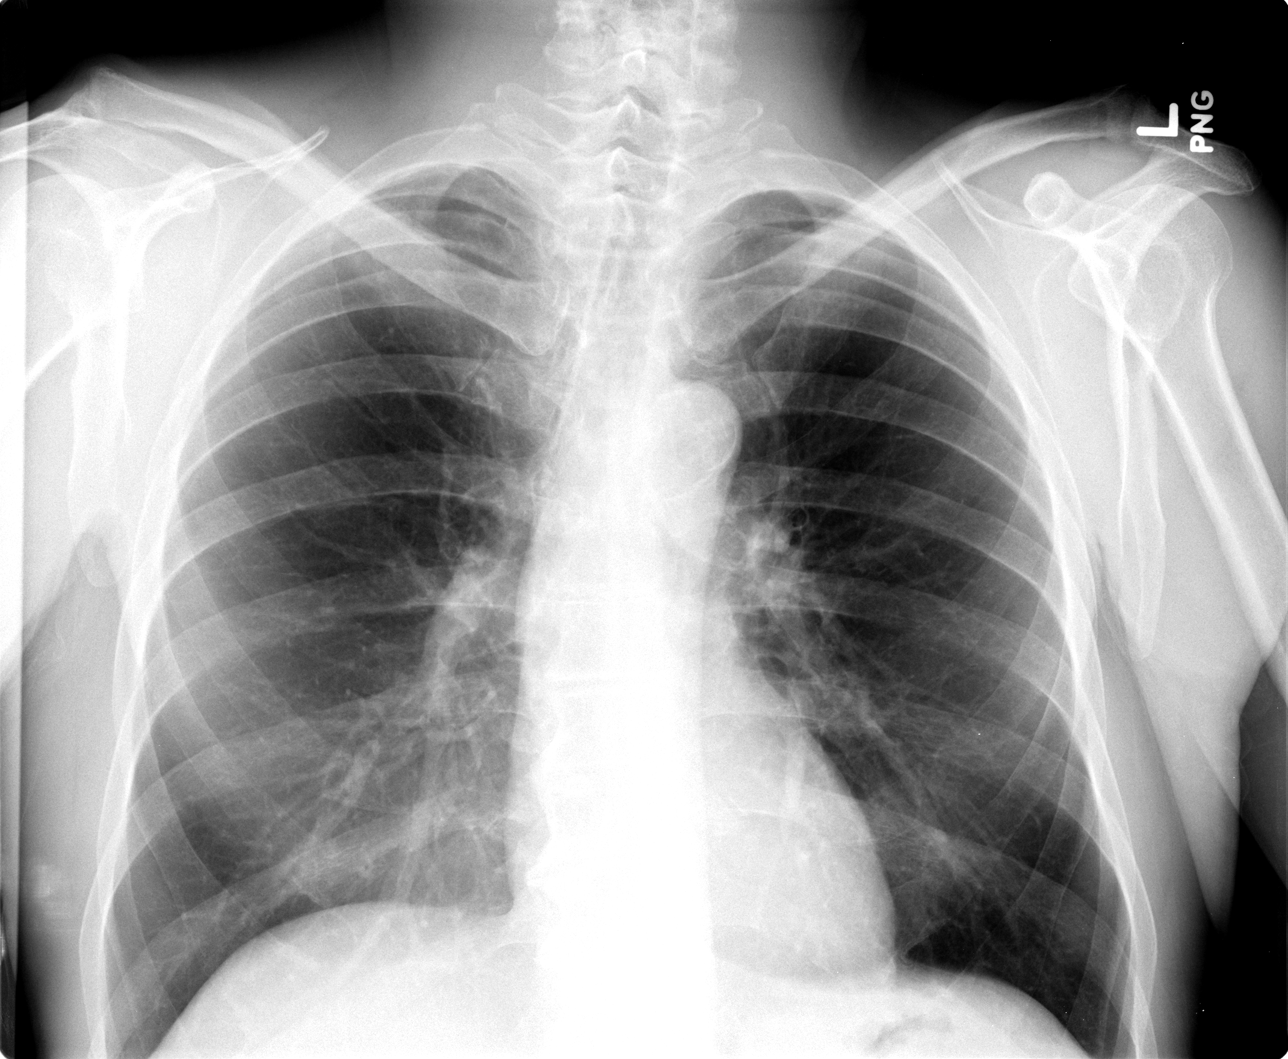

[view not recorded (2 of 3)]
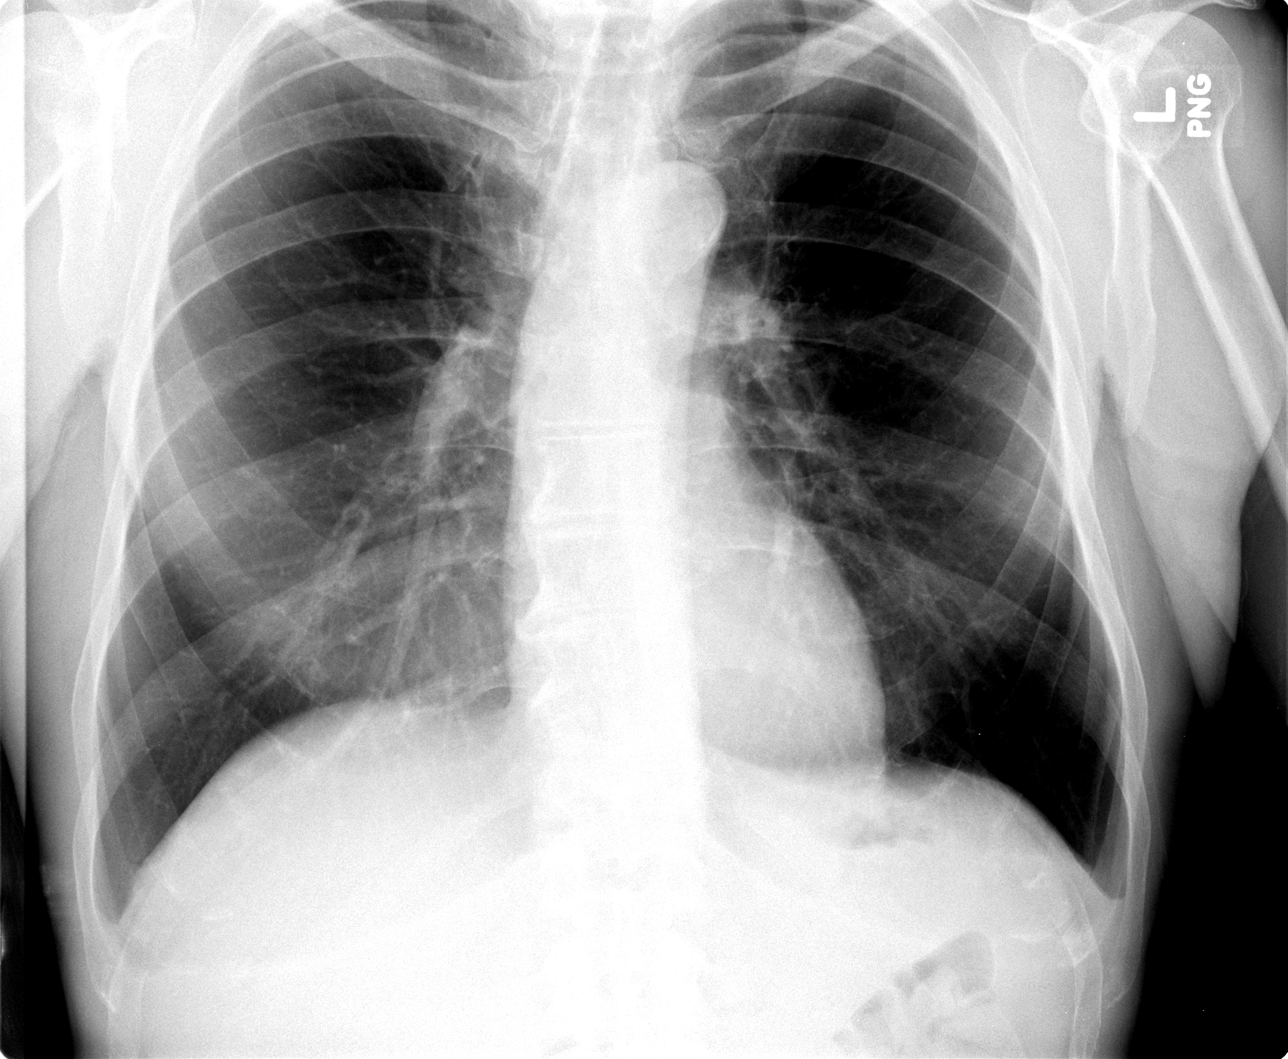

[view not recorded (3 of 3)]
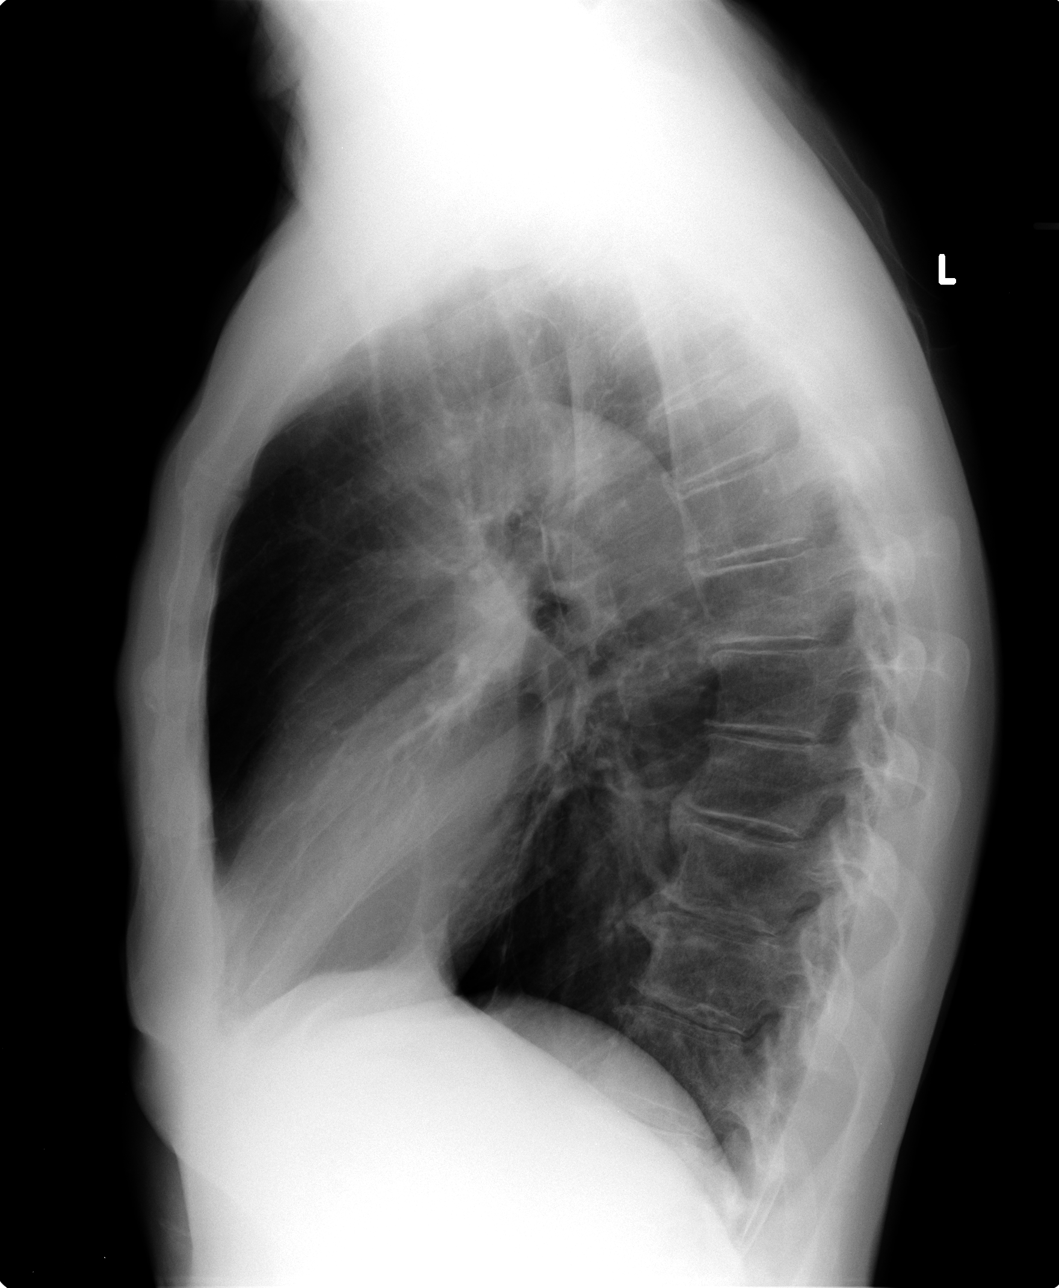

[3 of 3 positions shown; findings below may reference images not displayed]

FINDINGS: Normal heart size, mediastinal contours, and pulmonary vascularity.

Lungs hyperinflated with minimal central peribronchial thickening.

No acute infiltrate, pleural effusion or pneumothorax.

No definite calcified pleural plaque formation seen.

Atherosclerotic calcification noted at aortic arch.

Minimal degenerative disc disease changes inferior thoracic spine.
IMPRESSION: Hyperinflation with bronchitic changes question COPD.

No acute abnormalities.

## 2017-07-03 ENCOUNTER — Encounter: Payer: Medicare Other | Admitting: Pharmacist Clinician (PhC)/ Clinical Pharmacy Specialist

## 2017-07-14 DIAGNOSIS — E162 Hypoglycemia, unspecified: Secondary | ICD-10-CM | POA: Diagnosis not present

## 2017-07-14 DIAGNOSIS — I10 Essential (primary) hypertension: Secondary | ICD-10-CM | POA: Diagnosis not present

## 2017-07-14 DIAGNOSIS — E7800 Pure hypercholesterolemia, unspecified: Secondary | ICD-10-CM | POA: Diagnosis not present

## 2017-07-14 DIAGNOSIS — E1165 Type 2 diabetes mellitus with hyperglycemia: Secondary | ICD-10-CM | POA: Diagnosis not present

## 2017-07-19 ENCOUNTER — Other Ambulatory Visit: Payer: Self-pay | Admitting: Family Medicine

## 2017-07-21 ENCOUNTER — Other Ambulatory Visit: Payer: Self-pay | Admitting: *Deleted

## 2017-07-21 MED ORDER — INSULIN PEN NEEDLE 32G X 4 MM MISC: 3 refills | Status: DC

## 2017-07-24 ENCOUNTER — Encounter: Payer: Self-pay | Admitting: Pharmacist Clinician (PhC)/ Clinical Pharmacy Specialist

## 2017-08-03 ENCOUNTER — Other Ambulatory Visit: Payer: Self-pay | Admitting: Family Medicine

## 2017-08-21 ENCOUNTER — Ambulatory Visit (INDEPENDENT_AMBULATORY_CARE_PROVIDER_SITE_OTHER): Payer: Medicare Other | Admitting: Pharmacist Clinician (PhC)/ Clinical Pharmacy Specialist

## 2017-08-21 DIAGNOSIS — Z86718 Personal history of other venous thrombosis and embolism: Secondary | ICD-10-CM

## 2017-08-21 DIAGNOSIS — I482 Chronic atrial fibrillation, unspecified: Secondary | ICD-10-CM

## 2017-08-21 DIAGNOSIS — I4891 Unspecified atrial fibrillation: Secondary | ICD-10-CM

## 2017-08-21 DIAGNOSIS — D6859 Other primary thrombophilia: Secondary | ICD-10-CM | POA: Diagnosis not present

## 2017-08-21 LAB — COAGUCHEK XS/INR WAIVED
INR: 3.3 — AB (ref 0.9–1.1)
Prothrombin Time: 40.2 s

## 2017-08-21 NOTE — Patient Instructions (Signed)
Description   Change to taking 1 tablet every day of the week except for Tuesdays and Thursdays take 1 1/2 tablets.  INR today is 3.3 (goal is 2-3)  Just a little thin today

## 2017-08-26 ENCOUNTER — Other Ambulatory Visit: Payer: Self-pay | Admitting: Family Medicine

## 2017-08-26 ENCOUNTER — Other Ambulatory Visit: Payer: Self-pay | Admitting: Cardiovascular Disease

## 2017-08-26 NOTE — Telephone Encounter (Signed)
Rx sent to pharmacy   

## 2017-09-07 ENCOUNTER — Other Ambulatory Visit: Payer: Self-pay | Admitting: Family Medicine

## 2017-09-24 ENCOUNTER — Other Ambulatory Visit: Payer: Self-pay | Admitting: Family Medicine

## 2017-09-24 DIAGNOSIS — Z029 Encounter for administrative examinations, unspecified: Secondary | ICD-10-CM

## 2017-09-25 ENCOUNTER — Ambulatory Visit: Payer: Medicare Other | Admitting: Pharmacist Clinician (PhC)/ Clinical Pharmacy Specialist

## 2017-09-25 DIAGNOSIS — I4891 Unspecified atrial fibrillation: Secondary | ICD-10-CM

## 2017-09-25 DIAGNOSIS — D6859 Other primary thrombophilia: Secondary | ICD-10-CM

## 2017-09-25 DIAGNOSIS — I482 Chronic atrial fibrillation, unspecified: Secondary | ICD-10-CM

## 2017-09-25 DIAGNOSIS — Z86718 Personal history of other venous thrombosis and embolism: Secondary | ICD-10-CM

## 2017-09-25 DIAGNOSIS — R5383 Other fatigue: Secondary | ICD-10-CM

## 2017-09-25 LAB — COAGUCHEK XS/INR WAIVED
INR: 2.5 — AB (ref 0.9–1.1)
PROTHROMBIN TIME: 29.5 s

## 2017-09-25 NOTE — Patient Instructions (Signed)
Description   Continue taking 1 tablet every day of the week except for Tuesdays and Thursdays take 1 1/2 tablets.  INR today is 2.5 (goal is 2-3)  Perfect reading today

## 2017-09-26 LAB — ANEMIA PROFILE B
BASOS ABS: 0 10*3/uL (ref 0.0–0.2)
BASOS: 0 %
EOS (ABSOLUTE): 0.1 10*3/uL (ref 0.0–0.4)
Eos: 2 %
FERRITIN: 125 ng/mL (ref 30–400)
Hematocrit: 42 % (ref 37.5–51.0)
Hemoglobin: 13.8 g/dL (ref 13.0–17.7)
Immature Grans (Abs): 0 10*3/uL (ref 0.0–0.1)
Immature Granulocytes: 0 %
Iron Saturation: 54 % (ref 15–55)
Iron: 119 ug/dL (ref 38–169)
Lymphocytes Absolute: 1.2 10*3/uL (ref 0.7–3.1)
Lymphs: 26 %
MCH: 30.9 pg (ref 26.6–33.0)
MCHC: 32.9 g/dL (ref 31.5–35.7)
MCV: 94 fL (ref 79–97)
MONOS ABS: 0.3 10*3/uL (ref 0.1–0.9)
Monocytes: 6 %
Neutrophils Absolute: 3.1 10*3/uL (ref 1.4–7.0)
Neutrophils: 66 %
PLATELETS: 164 10*3/uL (ref 150–450)
RBC: 4.46 x10E6/uL (ref 4.14–5.80)
RDW: 13.1 % (ref 12.3–15.4)
RETIC CT PCT: 0.9 % (ref 0.6–2.6)
Total Iron Binding Capacity: 220 ug/dL — ABNORMAL LOW (ref 250–450)
UIBC: 101 ug/dL — ABNORMAL LOW (ref 111–343)
VITAMIN B 12: 627 pg/mL (ref 232–1245)
WBC: 4.7 10*3/uL (ref 3.4–10.8)

## 2017-09-26 LAB — THYROID PANEL WITH TSH
FREE THYROXINE INDEX: 1.6 (ref 1.2–4.9)
T3 Uptake Ratio: 28 % (ref 24–39)
T4, Total: 5.7 ug/dL (ref 4.5–12.0)
TSH: 2.57 u[IU]/mL (ref 0.450–4.500)

## 2017-10-29 LAB — HM DIABETES EYE EXAM

## 2017-11-11 ENCOUNTER — Ambulatory Visit (INDEPENDENT_AMBULATORY_CARE_PROVIDER_SITE_OTHER): Payer: Medicare Other | Admitting: Pharmacist Clinician (PhC)/ Clinical Pharmacy Specialist

## 2017-11-11 DIAGNOSIS — I482 Chronic atrial fibrillation, unspecified: Secondary | ICD-10-CM | POA: Diagnosis not present

## 2017-11-11 DIAGNOSIS — D6859 Other primary thrombophilia: Secondary | ICD-10-CM | POA: Diagnosis not present

## 2017-11-11 DIAGNOSIS — I4891 Unspecified atrial fibrillation: Secondary | ICD-10-CM

## 2017-11-11 DIAGNOSIS — Z86718 Personal history of other venous thrombosis and embolism: Secondary | ICD-10-CM | POA: Diagnosis not present

## 2017-11-11 LAB — COAGUCHEK XS/INR WAIVED
INR: 3.1 — ABNORMAL HIGH (ref 0.9–1.1)
Prothrombin Time: 37.2 s

## 2017-11-11 NOTE — Patient Instructions (Signed)
Description   Continue taking 1 tablet every day of the week except for Tuesdays and Thursdays take 1 1/2 tablets.  INR today is 3.1 (goal is 2-3)  Slightly thin today

## 2017-12-16 ENCOUNTER — Other Ambulatory Visit: Payer: Self-pay | Admitting: Cardiovascular Disease

## 2017-12-24 ENCOUNTER — Other Ambulatory Visit: Payer: Medicare Other

## 2017-12-25 ENCOUNTER — Other Ambulatory Visit: Payer: Medicare Other

## 2017-12-25 DIAGNOSIS — I4891 Unspecified atrial fibrillation: Secondary | ICD-10-CM

## 2017-12-25 DIAGNOSIS — Z794 Long term (current) use of insulin: Secondary | ICD-10-CM

## 2017-12-25 DIAGNOSIS — N400 Enlarged prostate without lower urinary tract symptoms: Secondary | ICD-10-CM

## 2017-12-25 DIAGNOSIS — E119 Type 2 diabetes mellitus without complications: Secondary | ICD-10-CM

## 2017-12-25 DIAGNOSIS — E7849 Other hyperlipidemia: Secondary | ICD-10-CM

## 2017-12-25 DIAGNOSIS — I10 Essential (primary) hypertension: Secondary | ICD-10-CM

## 2017-12-25 DIAGNOSIS — E559 Vitamin D deficiency, unspecified: Secondary | ICD-10-CM

## 2017-12-25 LAB — BAYER DCA HB A1C WAIVED: HB A1C: 7.2 % — AB (ref <7.0)

## 2017-12-26 LAB — BMP8+EGFR
BUN/Creatinine Ratio: 22 (ref 10–24)
BUN: 21 mg/dL (ref 8–27)
CALCIUM: 9.3 mg/dL (ref 8.6–10.2)
CHLORIDE: 102 mmol/L (ref 96–106)
CO2: 24 mmol/L (ref 20–29)
Creatinine, Ser: 0.95 mg/dL (ref 0.76–1.27)
GFR calc non Af Amer: 83 mL/min/{1.73_m2} (ref >59)
GFR, EST AFRICAN AMERICAN: 96 mL/min/{1.73_m2} (ref >59)
GLUCOSE: 170 mg/dL — AB (ref 65–99)
POTASSIUM: 4 mmol/L (ref 3.5–5.2)
Sodium: 140 mmol/L (ref 134–144)

## 2017-12-26 LAB — CBC WITH DIFFERENTIAL/PLATELET
Basophils Absolute: 0 10*3/uL (ref 0.0–0.2)
Basos: 0 %
EOS (ABSOLUTE): 0.1 10*3/uL (ref 0.0–0.4)
Eos: 2 %
Hematocrit: 41.5 % (ref 37.5–51.0)
Hemoglobin: 13.8 g/dL (ref 13.0–17.7)
IMMATURE GRANULOCYTES: 0 %
Immature Grans (Abs): 0 10*3/uL (ref 0.0–0.1)
Lymphocytes Absolute: 1.4 10*3/uL (ref 0.7–3.1)
Lymphs: 30 %
MCH: 30.9 pg (ref 26.6–33.0)
MCHC: 33.3 g/dL (ref 31.5–35.7)
MCV: 93 fL (ref 79–97)
MONOCYTES: 6 %
Monocytes Absolute: 0.3 10*3/uL (ref 0.1–0.9)
Neutrophils Absolute: 2.8 10*3/uL (ref 1.4–7.0)
Neutrophils: 62 %
PLATELETS: 171 10*3/uL (ref 150–450)
RBC: 4.47 x10E6/uL (ref 4.14–5.80)
RDW: 12.4 % (ref 12.3–15.4)
WBC: 4.6 10*3/uL (ref 3.4–10.8)

## 2017-12-26 LAB — LIPID PANEL
Chol/HDL Ratio: 2.2 ratio (ref 0.0–5.0)
Cholesterol, Total: 123 mg/dL (ref 100–199)
HDL: 56 mg/dL (ref >39)
LDL CALC: 60 mg/dL (ref 0–99)
Triglycerides: 37 mg/dL (ref 0–149)
VLDL CHOLESTEROL CAL: 7 mg/dL (ref 5–40)

## 2017-12-26 LAB — HEPATIC FUNCTION PANEL
ALK PHOS: 50 IU/L (ref 39–117)
ALT: 18 IU/L (ref 0–44)
AST: 27 IU/L (ref 0–40)
Albumin: 4.2 g/dL (ref 3.6–4.8)
BILIRUBIN, DIRECT: 0.14 mg/dL (ref 0.00–0.40)
Bilirubin Total: 0.7 mg/dL (ref 0.0–1.2)
Total Protein: 6.3 g/dL (ref 6.0–8.5)

## 2017-12-26 LAB — PSA, TOTAL AND FREE
PROSTATE SPECIFIC AG, SERUM: 0.6 ng/mL (ref 0.0–4.0)
PSA, Free Pct: 21.7 %
PSA, Free: 0.13 ng/mL

## 2017-12-26 LAB — VITAMIN D 25 HYDROXY (VIT D DEFICIENCY, FRACTURES): VIT D 25 HYDROXY: 62 ng/mL (ref 30.0–100.0)

## 2017-12-30 ENCOUNTER — Ambulatory Visit (INDEPENDENT_AMBULATORY_CARE_PROVIDER_SITE_OTHER): Payer: Medicare Other | Admitting: Family Medicine

## 2017-12-30 ENCOUNTER — Encounter: Payer: Self-pay | Admitting: Family Medicine

## 2017-12-30 VITALS — BP 127/78 | HR 88 | Temp 97.2°F | Ht 70.5 in | Wt 158.0 lb

## 2017-12-30 DIAGNOSIS — N400 Enlarged prostate without lower urinary tract symptoms: Secondary | ICD-10-CM

## 2017-12-30 DIAGNOSIS — I480 Paroxysmal atrial fibrillation: Secondary | ICD-10-CM

## 2017-12-30 DIAGNOSIS — E119 Type 2 diabetes mellitus without complications: Secondary | ICD-10-CM

## 2017-12-30 DIAGNOSIS — E559 Vitamin D deficiency, unspecified: Secondary | ICD-10-CM

## 2017-12-30 DIAGNOSIS — Z794 Long term (current) use of insulin: Secondary | ICD-10-CM

## 2017-12-30 DIAGNOSIS — I10 Essential (primary) hypertension: Secondary | ICD-10-CM

## 2017-12-30 DIAGNOSIS — E1169 Type 2 diabetes mellitus with other specified complication: Secondary | ICD-10-CM

## 2017-12-30 DIAGNOSIS — C439 Malignant melanoma of skin, unspecified: Secondary | ICD-10-CM

## 2017-12-30 DIAGNOSIS — Z0000 Encounter for general adult medical examination without abnormal findings: Secondary | ICD-10-CM | POA: Diagnosis not present

## 2017-12-30 DIAGNOSIS — E7849 Other hyperlipidemia: Secondary | ICD-10-CM

## 2017-12-30 DIAGNOSIS — I4891 Unspecified atrial fibrillation: Secondary | ICD-10-CM

## 2017-12-30 DIAGNOSIS — Z23 Encounter for immunization: Secondary | ICD-10-CM | POA: Diagnosis not present

## 2017-12-30 DIAGNOSIS — E785 Hyperlipidemia, unspecified: Secondary | ICD-10-CM

## 2017-12-30 LAB — URINALYSIS, COMPLETE
Bilirubin, UA: NEGATIVE
GLUCOSE, UA: NEGATIVE
Ketones, UA: NEGATIVE
LEUKOCYTES UA: NEGATIVE
Nitrite, UA: NEGATIVE
PH UA: 5.5 (ref 5.0–7.5)
Protein, UA: NEGATIVE
RBC, UA: NEGATIVE
Specific Gravity, UA: 1.02 (ref 1.005–1.030)
UUROB: 0.2 mg/dL (ref 0.2–1.0)

## 2017-12-30 LAB — MICROSCOPIC EXAMINATION
Bacteria, UA: NONE SEEN
Epithelial Cells (non renal): NONE SEEN /hpf (ref 0–10)
RENAL EPITHEL UA: NONE SEEN /HPF
WBC, UA: NONE SEEN /hpf (ref 0–5)

## 2017-12-30 LAB — COAGUCHEK XS/INR WAIVED
INR: 2.3 — AB (ref 0.9–1.1)
PROTHROMBIN TIME: 28 s

## 2017-12-30 NOTE — Patient Instructions (Addendum)
Medicare Annual Wellness Visit  [facility] and the medical providers at [facility] strive to bring you the best medical care.  In doing so we not only want to address your current medical conditions and concerns but also to detect new conditions early and prevent illness, disease and health-related problems.    Medicare offers a yearly Wellness Visit which allows our clinical staff to assess your need for preventative services including immunizations, lifestyle education, counseling to decrease risk of preventable diseases and screening for fall risk and other medical concerns.    This visit is provided free of charge (no copay) for all Medicare recipients. The clinical pharmacists at [facility] have begun to conduct these Wellness Visits which will also include a thorough review of all your medications.    As you primary medical provider recommend that you make an appointment for your Annual Wellness Visit if you have not done so already this year.  You may set up this appointment before you leave today or you may call back ([phone]) and schedule an appointment.  Please make sure when you call that you mention that you are scheduling your Annual Wellness Visit with the clinical pharmacist so that the appointment may be made for the proper length of time.     Continue current medications. Continue good therapeutic lifestyle changes which include good diet and exercise. Fall precautions discussed with patient. If an FOBT was given today- please return it to our front desk. If you are over 32 years old - you may need Prevnar 48 or the adult Pneumonia vaccine.  **Flu shots are available--- please call and schedule a FLU-CLINIC appointment**  After your visit with [place] today you will receive a survey in the mail or online from [employer] regarding your care with [place]. Please take a moment to fill this out. Your feedback is very  important to [place] as you can help [place] better understand your patient needs as well as improve your experience and satisfaction. WE CARE ABOUT YOU!!!   Follow-up with cardiology as planned Get colonoscopy as planned Continue to get yearly eye exams because of diabetes and make sure that we get a copy of that report Follow-up with endocrinology as planned Repeat INR in 6 weeks. Patient will receive flu shot today. With all of her blood work looking good, patient should continue with his current therapeutic lifestyle changes.

## 2017-12-30 NOTE — Progress Notes (Signed)
Subjective:    [name]: [name], male    DOB: 1950-05-10, 67 y.o.   MRN: [id number]  HPI  Patient is here today for annual wellness exam and follow up of chronic medical problems which includes diabetes, a fib and hypertension. He is taking medication regularly.  The patient is doing well overall and comes in today for a complete physical exam.  He also has already gotten his INR and it was 2.3.  He will also get a flu shot today.  Blood pressure readings were good on recheck it was 127/78.  He has no specific complaints.  Lab work that has been done has a PSA which is stable at 0.6.  The blood sugar was elevated at 170.  The creatinine, and all of the electrolytes were normal.  CBC has a normal white blood cell count a stable hemoglobin at 13.8 and an adequate platelet count.  All cholesterol numbers were excellent and at goal with an LDL C being 60 and triglycerides been great at 37 and a good cholesterol being good at 56.  Vitamin D level was good at 62.0.  All liver function tests were normal.  The hemoglobin A1c was improved from 8 months ago and is now 7.2%.  We will give the patient a copy of his lab work results and he will take a copy with him when he goes to see his endocrinologist.  The patient is pleasant and feeling well and doing well.  He does have a colonoscopy planned for January with his wife's gastroenterologist.  This will be a 10-year repeat.  There is no family history of colon cancer.  Today he denies any chest pain pressure tightness or shortness of breath.  He periodically sees his cardiologist Dr. Alvester Chou.  He denies any trouble with swallowing heartburn indigestion nausea vomiting diarrhea blood in the stool or black tarry bowel movements or change in bowel habits.  He is passing his water well and has no erectile dysfunction issues.  He sees Dr. Michiel Sites for his diabetes follow-up.  He will get another INR in about 6 weeks.     Patient Active Problem List   Diagnosis  Date Noted  . Erectile dysfunction due to arterial insufficiency 06/23/2017  . Asymmetrical right sensorineural hearing loss 03/25/2017  . Subjective tinnitus of both ears 03/25/2017  . Primary hypercoagulable state ([place]) [D68.59] 03/04/2016  . History of DVT of lower extremity 03/04/2016  . Melanoma of skin ([place]) 11/14/2015  . Chronic atrial fibrillation 07/04/2015  . Incisional hernia, without obstruction or gangrene 11/29/2013  . Vitamin D deficiency 11/29/2013  . Type 2 diabetes mellitus treated with insulin ([place]) 07/29/2012  . Long term (current) use of anticoagulants 04/22/2012  . Hyperlipidemia 06/23/2008  . Benign essential HTN 06/23/2008  . Paroxysmal atrial fibrillation ([place]) 06/23/2008  . Asbestos exposure 06/23/2008   Outpatient Encounter Medications as of 12/30/2017  Medication Sig  . atorvastatin (LIPITOR) 20 MG tablet TAKE 1 TABLET BY MOUTH ONCE A DAY  . co-enzyme Q-10 30 MG capsule Take 100 mg by mouth daily.   [name] diltiazem (CARDIZEM CD) 180 MG 24 hr capsule Take 1 capsule (180 mg total) by mouth daily. NEED OV.  . fluticasone (FLONASE) 50 MCG/ACT nasal spray SPRAY 2 SPRAYS INTO EACH NOSTRIL EVERY DAY  . Insulin Glargine (BASAGLAR KWIKPEN) 100 UNIT/ML SOPN Inject 17 Units into the skin every evening.   . insulin lispro (HUMALOG KWIKPEN) 100 UNIT/ML KwikPen Inject into the skin. Inject 6 to  10 units TID per sliding scale as directed.  . Insulin Pen Needle (BD PEN NEEDLE NANO U/F) 32G X 4 MM MISC USE WITH INSULIN 4 TIMES A DAY  . sildenafil (REVATIO) 20 MG tablet TAKE 2-5 TABLETS AS NEEDED PRIOR TO SEXUAL ACTIVITY  . sotalol (BETAPACE) 80 MG tablet Take 1 tablet (80 mg total) by mouth 2 (two) times daily.  [name] warfarin (COUMADIN) 2.5 MG tablet TAKE 1 TO 1 AND 1/2 TABLETS DAILY AS DIRECTED  . valACYclovir (VALTREX) 500 MG tablet TAKE 4 TABLETS BY MOUTH TWICE DAILY AS DIRECTED AND AS NEEDED (Patient not taking: Reported on 12/30/2017)  . [DISCONTINUED] insulin aspart (NOVOLOG  FLEXPEN) 100 UNIT/ML FlexPen Inject 2 Units into the skin 2 (two) times daily with a meal. Use according to sliding scale given in office (Patient taking differently: Inject 2-5 Units into the skin 3 (three) times daily with meals. Use according to sliding scale given in office)  . [DISCONTINUED] NOVOLOG FLEXPEN 100 UNIT/ML FlexPen INJECT 2 UNITS INTO THE SKIN TWICE DAILY WITH A MEAL. USE ACCORDING TO SLIDING SCALE GIVEN IN OFFICE  . [DISCONTINUED] scopolamine (TRANSDERM-SCOP, 1.5 MG,) 1 MG/3DAYS Place 1 patch (1.5 mg total) onto the skin every 3 (three) days. (Patient not taking: Reported on 06/23/2017)   No facility-administered encounter medications on file as of 12/30/2017.      Review of Systems  Constitutional: Negative.   HENT: Negative.   Eyes: Negative.   Respiratory: Negative.   Cardiovascular: Negative.   Gastrointestinal: Negative.   Endocrine: Negative.   Genitourinary: Negative.   Musculoskeletal: Negative.   Skin: Negative.   Allergic/Immunologic: Negative.   Neurological: Negative.   Hematological: Negative.   Psychiatric/Behavioral: Negative.        Objective:   Physical Exam  Constitutional: He is oriented to person, place, and time. He appears well-developed and well-nourished. No distress.  Is pleasant and alert and feeling well.  He is active physically with no complaints.  HENT:  Head: Normocephalic and atraumatic.  Right Ear: External ear normal.  Left Ear: External ear normal.  Nose: Nose normal.  Mouth/Throat: Oropharynx is clear and moist. No oropharyngeal exudate.  Eyes: Pupils are equal, round, and reactive to light. Conjunctivae and EOM are normal. Right eye exhibits no discharge. Left eye exhibits no discharge. No scleral icterus.  Gets yearly eye exam and patient says may have some slight macular degeneration from that eye exam evaluation  Neck: Normal range of motion. Neck supple. No thyromegaly present.  No bruits thyromegaly or anterior cervical  adenopathy  Cardiovascular: Normal rate, normal heart sounds and intact distal pulses.  No murmur heard. Heart is irregular irregular at 84/min with good pedal pulses and no edema  Pulmonary/Chest: Effort normal and breath sounds normal. He has no wheezes. He has no rales. He exhibits no tenderness.  Clear anteriorly and posteriorly no axillary adenopathy or chest wall masses  Abdominal: Soft. Bowel sounds are normal. He exhibits no mass. There is no tenderness.  No liver or spleen enlargement.  No epigastric tenderness.  No inguinal adenopathy.  No masses with good inguinal pulses  Genitourinary: Rectum normal and penis normal.  Genitourinary Comments: The prostate gland is enlarged but soft and smooth without lumps or masses.  There were no rectal masses.  External genitalia were within normal limits with no inguinal hernias palpated.  The patient does have some noninflamed external hemorrhoids.  Musculoskeletal: Normal range of motion. He exhibits no edema or tenderness.  Good range of motion of  all extremities without edema.  There were a lot of varicosities on both lower extremities left greater than right.  Most areas on the plantar surfaces of both feet with the right being worse than the left.  Patient is aware of this and get some checked daily.  Lymphadenopathy:    He has no cervical adenopathy.  Neurological: He is alert and oriented to person, place, and time. He has normal reflexes. No cranial nerve deficit.  Skin: Skin is warm and dry. No rash noted.  Psychiatric: He has a normal mood and affect. His behavior is normal. Judgment and thought content normal.  The patient's mood affect and behavior were all normal for him.  Nursing note and vitals reviewed.  BP 129/90 (BP Location: Left Arm)   Pulse 88   Temp (!) 97.2 F (36.2 C) (Oral)   Ht 5' 10.5" (1.791 m)   Wt 158 lb (71.7 kg)   BMI 22.35 kg/m         Assessment & Plan:  1. Benign essential HTN -The blood pressure  is good today and patient will continue with his current treatment.  2. Type 2 diabetes mellitus treated with insulin (HCC) -The hemoglobin A1c remains good and he will continue with current treatment and follow-up by endocrinologist  3. Other hyperlipidemia -Continue with current treatment as all cholesterol numbers were excellent  4. Vitamin D deficiency -Continue with vitamin D replacement  5. Benign prostatic hyperplasia, unspecified whether lower urinary tract symptoms present -Continue to drink plenty of fluids and stay well-hydrated.  Patient having no symptoms with BPH. - Urinalysis, Complete  6. Paroxysmal atrial fibrillation (HCC) -Follow-up with Dr. Gwenlyn Found as planned - CoaguChek XS/INR Waived  7. Annual physical exam - Urinalysis, Complete  8. Atrial fibrillation, unspecified type [facility]) -Follow-up with cardiology as planned  9. Melanoma of skin ([place]) -Follow-up with dermatology as planned  10. Hyperlipidemia associated with type 2 diabetes mellitus ([place]) -Continue current treatment and aggressive therapeutic lifestyle changes  11. Encounter for immunization - Flu vaccine HIGH DOSE PF   Patient Instructions                       Medicare Annual Wellness Visit  [facility] and the medical providers at [facility] strive to bring you the best medical care.  In doing so we not only want to address your current medical conditions and concerns but also to detect new conditions early and prevent illness, disease and health-related problems.    Medicare offers a yearly Wellness Visit which allows our clinical staff to assess your need for preventative services including immunizations, lifestyle education, counseling to decrease risk of preventable diseases and screening for fall risk and other medical concerns.    This visit is provided free of charge (no copay) for all Medicare recipients. The clinical pharmacists at [facility] have begun to conduct these Wellness Visits which will also include a thorough review of all your medications.    As you primary medical provider recommend that you make an appointment for your Annual Wellness Visit if you have not done so already this year.  You may set up this appointment before you leave today or you may call back ([phone]) and schedule an appointment.  Please make sure when you call that you mention that you are scheduling your Annual Wellness Visit with the clinical pharmacist so that the appointment may be made for the proper length of time.  Continue current medications. Continue good therapeutic lifestyle changes which include good diet and exercise. Fall precautions discussed with patient. If an FOBT was given today- please return it to our front desk. If you are over 88 years old - you may need Prevnar 38 or the adult Pneumonia vaccine.  **Flu shots are available--- please call and schedule a FLU-CLINIC appointment**  After your visit with [place] today you will receive a survey in the mail or online from [employer] regarding your care with [place]. Please take a moment to fill this out. Your feedback is very important to [place] as you can help [place] better understand your patient needs as well as improve your experience and satisfaction. WE CARE ABOUT YOU!!!   Follow-up with cardiology as planned Get colonoscopy as planned Continue to get yearly eye exams because of diabetes and make sure that we get a copy of that report Follow-up with endocrinology as planned Repeat INR in 6 weeks. Patient will receive flu shot today. With all of her blood work looking good, patient should continue with his current therapeutic lifestyle changes.   Arrie Senate MD

## 2018-01-08 ENCOUNTER — Other Ambulatory Visit: Payer: Self-pay | Admitting: Cardiovascular Disease

## 2018-02-06 ENCOUNTER — Other Ambulatory Visit: Payer: Self-pay | Admitting: Cardiovascular Disease

## 2018-02-09 ENCOUNTER — Other Ambulatory Visit: Payer: Self-pay | Admitting: Family Medicine

## 2018-02-09 ENCOUNTER — Other Ambulatory Visit: Payer: Self-pay | Admitting: Cardiovascular Disease

## 2018-02-16 ENCOUNTER — Other Ambulatory Visit: Payer: Self-pay | Admitting: Cardiovascular Disease

## 2018-02-16 MED ORDER — DILTIAZEM HCL ER COATED BEADS 180 MG PO CP24: 180.0000 mg | ORAL_CAPSULE | Freq: Every day | ORAL | 0 refills | Status: DC

## 2018-02-16 MED ORDER — SOTALOL HCL 80 MG PO TABS: 80.0000 mg | ORAL_TABLET | Freq: Two times a day (BID) | ORAL | 0 refills | Status: DC

## 2018-02-16 NOTE — Telephone Encounter (Signed)
Rx(s) sent to pharmacy electronically. Patient called and scheduled for overdue 1 year visit with MD

## 2018-02-16 NOTE — Telephone Encounter (Signed)
New Message    *STAT* If patient is at the pharmacy, call can be transferred to refill team.   1. Which medications need to be refilled? (please list name of each medication and dose if known) Diltiazem 180mg  and Sotalol 80mg   2. Which pharmacy/location (including street and city if local pharmacy) is medication to be sent to? [facility] [place]  3. Do they need a 30 day or 90 day supply? 90 day supply

## 2018-02-24 ENCOUNTER — Telehealth: Payer: Self-pay | Admitting: Family Medicine

## 2018-02-24 NOTE — Telephone Encounter (Signed)
Requesting conversation with provider's nurse.

## 2018-02-24 NOTE — Telephone Encounter (Signed)
PT is wanting to talk to [facility] about his Insulin Glargine (BASAGLAR KWIKPEN) 100 UNIT/ML SOPN

## 2018-02-24 NOTE — Telephone Encounter (Signed)
Pt wanted samples - in fridge aware

## 2018-03-12 ENCOUNTER — Ambulatory Visit: Payer: Medicare Other | Admitting: Cardiovascular Disease

## 2018-03-12 ENCOUNTER — Encounter: Payer: Self-pay | Admitting: Cardiovascular Disease

## 2018-03-12 DIAGNOSIS — I480 Paroxysmal atrial fibrillation: Secondary | ICD-10-CM

## 2018-03-12 DIAGNOSIS — I10 Essential (primary) hypertension: Secondary | ICD-10-CM

## 2018-03-12 DIAGNOSIS — E782 Mixed hyperlipidemia: Secondary | ICD-10-CM

## 2018-03-12 MED ORDER — DILTIAZEM HCL ER COATED BEADS 240 MG PO CP24: 240.0000 mg | ORAL_CAPSULE | Freq: Every day | ORAL | 3 refills | Status: DC

## 2018-03-12 MED ORDER — APIXABAN 5 MG PO TABS: 5.0000 mg | ORAL_TABLET | Freq: Two times a day (BID) | ORAL | 3 refills | Status: DC

## 2018-03-12 NOTE — Progress Notes (Signed)
03/12/2018 [name]   06/26/1950  [id number]  Primary Physician Chipper Herb, MD Primary Cardiologist: Lorretta Harp MD Garret Reddish, [place], [place]  HPI:  Christopher Avery is a 68 y.o.  thin and fit-appearing married Caucasian male, father of 43, grandfather of 4 grandchildren, who I saw  07/30/2016. His mother-in-law, Christopher Avery, was a patient of mine for a long time and died 2016-06-15.  He is accompanied by his wife Christopher Avery today.Christopher KitchenHe has a history of paroxysmal atrial fibrillation, maintaining sinus rhythm on Coumadin anticoagulation and sotalol. His other problems include hypertension, hyperlipidemia, and family history of heart disease.   Since I saw him a year ago he is remained stable.  He denies chest pain or shortness of breath.   Current Meds  Medication Sig  . atorvastatin (LIPITOR) 20 MG tablet TAKE 1 TABLET BY MOUTH ONCE A DAY  . co-enzyme Q-10 30 MG capsule Take 100 mg by mouth daily.   Christopher Avery diltiazem (CARDIZEM CD) 180 MG 24 hr capsule Take 1 capsule (180 mg total) by mouth daily.  . fluticasone (FLONASE) 50 MCG/ACT nasal spray SPRAY 2 SPRAYS INTO EACH NOSTRIL EVERY DAY  . Insulin Glargine (BASAGLAR KWIKPEN) 100 UNIT/ML SOPN Inject 17 Units into the skin every evening.   . insulin lispro (HUMALOG KWIKPEN) 100 UNIT/ML KwikPen Inject into the skin. Inject 6 to 10 units TID per sliding scale as directed.  . Insulin Pen Needle (BD PEN NEEDLE NANO U/F) 32G X 4 MM MISC USE WITH INSULIN 4 TIMES A DAY  . sildenafil (REVATIO) 20 MG tablet TAKE 2-5 TABLETS AS NEEDED PRIOR TO SEXUAL ACTIVITY  . sotalol (BETAPACE) 80 MG tablet Take 1 tablet (80 mg total) by mouth 2 (two) times daily.  . valACYclovir (VALTREX) 500 MG tablet TAKE 4 TABLETS BY MOUTH TWICE DAILY AS DIRECTED AND AS NEEDED  . warfarin (COUMADIN) 2.5 MG tablet TAKE 1 TO 1 AND 1/2 TABLETS DAILY AS DIRECTED     Allergies  Allergen Reactions  . Januvia [Sitagliptin] Hives    Social History   Socioeconomic  History  . Marital status: Married    Spouse name: Not on file  . Number of children: 2  . Years of education: 14 years  . Highest education level: Some college, no degree  Occupational History  . Occupation: Retired  Scientific laboratory technician  . Financial resource strain: Not hard at all  . Food insecurity:    Worry: Never true    Inability: Never true  . Transportation needs:    Medical: No    Non-medical: No  Tobacco Use  . Smoking status: Never Smoker  . Smokeless tobacco: Never Used  Substance and Sexual Activity  . Alcohol use: Yes    Alcohol/week: 0.0 standard drinks    Comment: wine 3-4 x a yr  . Drug use: No  . Sexual activity: Yes  Lifestyle  . Physical activity:    Days per week: 7 days    Minutes per session: 60 min  . Stress: Not at all  Relationships  . Social connections:    Talks on phone: More than three times a week    Gets together: More than three times a week    Attends religious service: More than 4 times per year    Active member of club or organization: Yes    Attends meetings of clubs or organizations: More than 4 times per year    Relationship status: Married  . Intimate partner  violence:    Fear of current or ex partner: No    Emotionally abused: No    Physically abused: No    Forced sexual activity: No  Other Topics Concern  . Not on file  Social History Narrative  . Not on file     Review of Systems: General: negative for chills, fever, night sweats or weight changes.  Cardiovascular: negative for chest pain, dyspnea on exertion, edema, orthopnea, palpitations, paroxysmal nocturnal dyspnea or shortness of breath Dermatological: negative for rash Respiratory: negative for cough or wheezing Urologic: negative for hematuria Abdominal: negative for nausea, vomiting, diarrhea, bright red blood per rectum, melena, or hematemesis Neurologic: negative for visual changes, syncope, or dizziness All other systems reviewed and are otherwise negative except  as noted above.    Blood pressure 138/78, pulse 73, height 6\' 1"  (1.854 m), weight 159 lb 9.6 oz (72.4 kg).  General appearance: alert and no distress Neck: no adenopathy, no carotid bruit, no JVD, supple, symmetrical, trachea midline and thyroid not enlarged, symmetric, no tenderness/mass/nodules Lungs: clear to auscultation bilaterally Heart: irregularly irregular rhythm Extremities: extremities normal, atraumatic, no cyanosis or edema Pulses: 2+ and symmetric Skin: Skin color, texture, turgor normal. No rashes or lesions Neurologic: Alert and oriented X 3, normal strength and tone. Normal symmetric reflexes. Normal coordination and gait  EKG atrial fibrillation with a ventricular spots of 73, poor R wave progression.  I personally reviewed this EKG.  ASSESSMENT AND PLAN:   Hyperlipidemia History of hyperlipidemia on statin therapy with lipid profile performed 12/25/2017 revealing total cholesterol 123, LDL 60 and HDL of 56.  Benign essential HTN History of essential hypertension her blood pressure measured today at 138/78.  He is on diltiazem.  Continue current meds at current dosing.  Paroxysmal atrial fibrillation (HCC) History of PAF in the past now seemingly persistent on sotalol and Coumadin.  I am going to discontinue his sotalol and increase his diltiazem for rate control.  We will discuss transitioning him to Eliquis from Coumadin.  He is completely unaware that he is in A. fib.      Lorretta Harp MD FACP,FACC,FAHA, [facility] 03/12/2018 10:51 AM

## 2018-03-12 NOTE — Assessment & Plan Note (Signed)
History of essential hypertension her blood pressure measured today at 138/78.  He is on diltiazem.  Continue current meds at current dosing.

## 2018-03-12 NOTE — Patient Instructions (Signed)
Medication Instructions:  Your physician has recommended you make the following change in your medication:  STOP TAKING YOUR WARFARIN (COUMADIN)  START ELIQUIS 5 MG, ONE TABLET BY MOUTH DAILY   CONTINUE TAKING YOUR SOTALOL (BETAPACE) 80 MG ALONG WITH YOUR DILTIAZEM (CARDIZEM CD) 180 MG UNTIL YOU RUN OUT OF YOUR DILTIAZEM (CARDIZEM CD) .   WHEN YOU RUN OUT OF YOUR DILTIAZEM 180 MG, STOP TAKING THIS MEDICATION AND STOP TAKING YOUR SOTALOL AS WELL. THEN, CALL OUR HEARTCARE OFFICE TO SPEAK WITH A CLINICAL PHARMACIST ABOUT STARTING DILTIAZEM (CARDIZEM CD) 240 MG.   If you need a refill on your cardiac medications before your next appointment, please call your pharmacy.   Lab work: NONE If you have labs (blood work) drawn today and your tests are completely normal, you will receive your results only by: [name] MyChart Message (if you have MyChart) OR . A paper copy in the mail If you have any lab test that is abnormal or we need to change your treatment, we will call you to review the results.  Testing/Procedures: NONE  Follow-Up: At [facility], you and your health needs are our priority.  As part of our continuing mission to provide you with exceptional heart care, we have created designated Provider Care Teams.  These Care Teams include your primary Cardiologist (physician) and Advanced Practice Providers (APPs -  Physician Assistants and Nurse Practitioners) who all work together to provide you with the care you need, when you need it. . You will need a follow up appointment in 12 months.  Please call our office 2 months in advance to schedule this appointment.  You may see Dr. Gwenlyn Found or one of the following Advanced Practice Providers on your designated Care Team:   . Kerin Ransom, [place] . Almyra Deforest, PA-C . Fabian Sharp, PA-C . Jory Sims, DNP . Rosaria Ferries, PA-C . Roby Lofts, PA-C . Sande Rives, PA-C

## 2018-03-12 NOTE — Assessment & Plan Note (Signed)
History of hyperlipidemia on statin therapy with lipid profile performed 12/25/2017 revealing total cholesterol 123, LDL 60 and HDL of 56.

## 2018-03-12 NOTE — Assessment & Plan Note (Signed)
History of PAF in the past now seemingly persistent on sotalol and Coumadin.  I am going to discontinue his sotalol and increase his diltiazem for rate control.  We will discuss transitioning him to Eliquis from Coumadin.  He is completely unaware that he is in A. fib.

## 2018-03-15 ENCOUNTER — Ambulatory Visit (INDEPENDENT_AMBULATORY_CARE_PROVIDER_SITE_OTHER): Payer: Medicare Other | Admitting: Physician Assistant

## 2018-03-15 ENCOUNTER — Encounter: Payer: Self-pay | Admitting: Physician Assistant

## 2018-03-15 VITALS — BP 130/88 | HR 86 | Temp 96.8°F | Ht 73.0 in | Wt 161.0 lb

## 2018-03-15 DIAGNOSIS — S8992XA Unspecified injury of left lower leg, initial encounter: Secondary | ICD-10-CM | POA: Diagnosis not present

## 2018-03-15 DIAGNOSIS — S8012XA Contusion of left lower leg, initial encounter: Secondary | ICD-10-CM

## 2018-03-15 DIAGNOSIS — T148XXA Other injury of unspecified body region, initial encounter: Secondary | ICD-10-CM
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
## 2018-03-16 ENCOUNTER — Other Ambulatory Visit (INDEPENDENT_AMBULATORY_CARE_PROVIDER_SITE_OTHER): Payer: Medicare Other

## 2018-03-16 DIAGNOSIS — S8992XA Unspecified injury of left lower leg, initial encounter: Secondary | ICD-10-CM | POA: Diagnosis not present

## 2018-03-16 DIAGNOSIS — S8012XA Contusion of left lower leg, initial encounter: Secondary | ICD-10-CM

## 2018-03-16 DIAGNOSIS — T148XXA Other injury of unspecified body region, initial encounter: Secondary | ICD-10-CM

## 2018-03-17 ENCOUNTER — Telehealth: Payer: Self-pay | Admitting: Family Medicine

## 2018-03-17 ENCOUNTER — Ambulatory Visit: Payer: Medicare Other | Admitting: Pharmacist Clinician (PhC)/ Clinical Pharmacy Specialist

## 2018-03-17 ENCOUNTER — Encounter: Payer: Medicare Other | Admitting: Pharmacist Clinician (PhC)/ Clinical Pharmacy Specialist

## 2018-03-17 DIAGNOSIS — D6859 Other primary thrombophilia: Secondary | ICD-10-CM

## 2018-03-17 DIAGNOSIS — I4891 Unspecified atrial fibrillation: Secondary | ICD-10-CM

## 2018-03-17 DIAGNOSIS — I482 Chronic atrial fibrillation, unspecified: Secondary | ICD-10-CM

## 2018-03-17 DIAGNOSIS — Z86718 Personal history of other venous thrombosis and embolism: Secondary | ICD-10-CM

## 2018-03-17 LAB — COAGUCHEK XS/INR WAIVED
INR: 3.5 — ABNORMAL HIGH (ref 0.9–1.1)
Prothrombin Time: 41.8 s

## 2018-03-17 NOTE — Patient Instructions (Signed)
Description   No warfarin today and tomorrow, then 1/2 tablet Friday then 1 tablet a day until you return  INR today is 3.5 (goal 2-3)  Too thin today

## 2018-03-24 ENCOUNTER — Encounter: Payer: Self-pay | Admitting: Pharmacist Clinician (PhC)/ Clinical Pharmacy Specialist

## 2018-03-24 ENCOUNTER — Ambulatory Visit (INDEPENDENT_AMBULATORY_CARE_PROVIDER_SITE_OTHER): Payer: Medicare Other | Admitting: Pharmacist Clinician (PhC)/ Clinical Pharmacy Specialist

## 2018-03-24 DIAGNOSIS — I4891 Unspecified atrial fibrillation: Secondary | ICD-10-CM | POA: Diagnosis not present

## 2018-03-24 DIAGNOSIS — Z86718 Personal history of other venous thrombosis and embolism: Secondary | ICD-10-CM

## 2018-03-24 DIAGNOSIS — I482 Chronic atrial fibrillation, unspecified: Secondary | ICD-10-CM | POA: Diagnosis not present

## 2018-03-24 DIAGNOSIS — D6859 Other primary thrombophilia: Secondary | ICD-10-CM | POA: Diagnosis not present

## 2018-03-24 LAB — COAGUCHEK XS/INR WAIVED
INR: 2.1 — AB (ref 0.9–1.1)
Prothrombin Time: 24.9 s

## 2018-03-24 NOTE — Patient Instructions (Signed)
Description   Resume previous schedule of 1 tablet a day except for Mondays and Fridays take 1 1/2 tablets.  INR today is 2.1 (goal 2-3)  Perfect reading today!

## 2018-04-01 ENCOUNTER — Telehealth: Payer: Self-pay | Admitting: Pharmacist Clinician (PhC)/ Clinical Pharmacy Specialist

## 2018-04-01 NOTE — Telephone Encounter (Signed)
Patient called to let us know that he has finished his diltiazem 180 mg capsules and will be switching to the 240 mg dose tomorrow.  He will take last dose of sotalol tonight as well.    Patient has also decided to continue with warfarin rather than switching to Eliquis.  He has been stable for years with relatively few dose changes and does not wish to change at this time.

## 2018-04-08 ENCOUNTER — Telehealth: Payer: Self-pay | Admitting: Family Medicine

## 2018-04-08 DIAGNOSIS — H6123 Impacted cerumen, bilateral: Secondary | ICD-10-CM | POA: Insufficient documentation

## 2018-04-08 NOTE — Telephone Encounter (Signed)
Samples placed in refrigerator for pick up

## 2018-04-16 ENCOUNTER — Telehealth: Payer: Self-pay | Admitting: Family Medicine

## 2018-04-16 NOTE — Telephone Encounter (Signed)
Pt aware - 1 sample available.

## 2018-04-16 NOTE — Telephone Encounter (Signed)
Pt is wanting to talk to [name] about his Basavar medication?

## 2018-04-21 ENCOUNTER — Ambulatory Visit (INDEPENDENT_AMBULATORY_CARE_PROVIDER_SITE_OTHER): Payer: Medicare Other | Admitting: Pharmacist Clinician (PhC)/ Clinical Pharmacy Specialist

## 2018-04-21 ENCOUNTER — Encounter: Payer: Self-pay | Admitting: Pharmacist Clinician (PhC)/ Clinical Pharmacy Specialist

## 2018-04-21 ENCOUNTER — Other Ambulatory Visit: Payer: Self-pay

## 2018-04-21 DIAGNOSIS — I4891 Unspecified atrial fibrillation: Secondary | ICD-10-CM | POA: Diagnosis not present

## 2018-04-21 DIAGNOSIS — Z86718 Personal history of other venous thrombosis and embolism: Secondary | ICD-10-CM

## 2018-04-21 DIAGNOSIS — I482 Chronic atrial fibrillation, unspecified: Secondary | ICD-10-CM

## 2018-04-21 DIAGNOSIS — D6859 Other primary thrombophilia: Secondary | ICD-10-CM | POA: Diagnosis not present

## 2018-04-21 LAB — COAGUCHEK XS/INR WAIVED
INR: 2.9 — ABNORMAL HIGH (ref 0.9–1.1)
Prothrombin Time: 34.9 s

## 2018-04-21 NOTE — Patient Instructions (Addendum)
  Description   Continue taking warfarin the same way:  1 tablet every day except for Mondays and Fridays take 1 1/2 tablets.  INR today 2.9 (goal is 2-3)  Perfect reading today

## 2018-05-11 ENCOUNTER — Telehealth: Payer: Self-pay | Admitting: Family Medicine

## 2018-05-12 ENCOUNTER — Encounter: Payer: Self-pay | Admitting: *Deleted

## 2018-05-12 NOTE — Telephone Encounter (Signed)
Letter written and signed by DWM. Pt aware / up front

## 2018-05-18 ENCOUNTER — Other Ambulatory Visit: Payer: Self-pay | Admitting: Cardiovascular Disease

## 2018-06-02 ENCOUNTER — Other Ambulatory Visit: Payer: Self-pay

## 2018-06-02 ENCOUNTER — Encounter: Payer: Self-pay | Admitting: Pharmacist Clinician (PhC)/ Clinical Pharmacy Specialist

## 2018-06-02 ENCOUNTER — Ambulatory Visit: Payer: Medicare Other | Admitting: Pharmacist Clinician (PhC)/ Clinical Pharmacy Specialist

## 2018-06-02 DIAGNOSIS — Z86718 Personal history of other venous thrombosis and embolism: Secondary | ICD-10-CM

## 2018-06-02 DIAGNOSIS — I4891 Unspecified atrial fibrillation: Secondary | ICD-10-CM

## 2018-06-02 DIAGNOSIS — D6859 Other primary thrombophilia: Secondary | ICD-10-CM

## 2018-06-02 DIAGNOSIS — I482 Chronic atrial fibrillation, unspecified: Secondary | ICD-10-CM

## 2018-06-02 LAB — COAGUCHEK XS/INR WAIVED
INR: 2.8 — ABNORMAL HIGH (ref 0.9–1.1)
Prothrombin Time: 33.5 s

## 2018-06-02 MED ORDER — WARFARIN SODIUM 2.5 MG PO TABS: 2.5000 mg | ORAL_TABLET | Freq: Every day | ORAL | 3 refills | Status: DC

## 2018-06-02 NOTE — Patient Instructions (Signed)
Description   Continue taking warfarin the same way:  1 tablet every day except for Mondays and Fridays take 1 1/2 tablets.  INR today 2.8 (goal is 2-3)  Perfect reading today

## 2018-06-05 ENCOUNTER — Other Ambulatory Visit: Payer: Self-pay | Admitting: Family Medicine

## 2018-06-15 ENCOUNTER — Ambulatory Visit: Payer: Self-pay

## 2018-06-15 ENCOUNTER — Ambulatory Visit: Payer: Medicare Other | Admitting: Orthopaedic Surgery

## 2018-06-15 ENCOUNTER — Encounter: Payer: Self-pay | Admitting: Orthopaedic Surgery

## 2018-06-15 ENCOUNTER — Other Ambulatory Visit: Payer: Self-pay

## 2018-06-15 VITALS — BP 121/79 | HR 89 | Ht 72.0 in | Wt 159.0 lb

## 2018-06-15 DIAGNOSIS — L84 Corns and callosities: Secondary | ICD-10-CM | POA: Insufficient documentation

## 2018-06-15 DIAGNOSIS — M79671 Pain in right foot: Secondary | ICD-10-CM | POA: Insufficient documentation
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
## 2018-06-23 ENCOUNTER — Encounter: Payer: Self-pay | Admitting: *Deleted

## 2018-06-29 ENCOUNTER — Other Ambulatory Visit: Payer: Medicare Other

## 2018-06-29 ENCOUNTER — Other Ambulatory Visit: Payer: Self-pay

## 2018-06-29 DIAGNOSIS — E785 Hyperlipidemia, unspecified: Secondary | ICD-10-CM

## 2018-06-29 DIAGNOSIS — I4891 Unspecified atrial fibrillation: Secondary | ICD-10-CM

## 2018-06-29 DIAGNOSIS — E559 Vitamin D deficiency, unspecified: Secondary | ICD-10-CM

## 2018-06-29 DIAGNOSIS — E1169 Type 2 diabetes mellitus with other specified complication: Secondary | ICD-10-CM

## 2018-06-29 DIAGNOSIS — I10 Essential (primary) hypertension: Secondary | ICD-10-CM

## 2018-06-29 LAB — LIPID PANEL

## 2018-06-30 LAB — CBC WITH DIFFERENTIAL/PLATELET
Basophils Absolute: 0 10*3/uL (ref 0.0–0.2)
Basos: 1 %
EOS (ABSOLUTE): 0.1 10*3/uL (ref 0.0–0.4)
Eos: 3 %
Hematocrit: 41.8 % (ref 37.5–51.0)
Hemoglobin: 14 g/dL (ref 13.0–17.7)
Immature Grans (Abs): 0 10*3/uL (ref 0.0–0.1)
Immature Granulocytes: 0 %
Lymphocytes Absolute: 1.2 10*3/uL (ref 0.7–3.1)
Lymphs: 24 %
MCH: 31.3 pg (ref 26.6–33.0)
MCHC: 33.5 g/dL (ref 31.5–35.7)
MCV: 93 fL (ref 79–97)
Monocytes Absolute: 0.3 10*3/uL (ref 0.1–0.9)
Monocytes: 6 %
Neutrophils Absolute: 3.3 10*3/uL (ref 1.4–7.0)
Neutrophils: 66 %
Platelets: 167 10*3/uL (ref 150–450)
RBC: 4.48 x10E6/uL (ref 4.14–5.80)
RDW: 12.4 % (ref 11.6–15.4)
WBC: 5 10*3/uL (ref 3.4–10.8)

## 2018-06-30 LAB — HEPATIC FUNCTION PANEL
ALT: 19 IU/L (ref 0–44)
AST: 25 IU/L (ref 0–40)
Albumin: 4.4 g/dL (ref 3.8–4.8)
Alkaline Phosphatase: 54 IU/L (ref 39–117)
Bilirubin Total: 0.6 mg/dL (ref 0.0–1.2)
Bilirubin, Direct: 0.17 mg/dL (ref 0.00–0.40)
Total Protein: 6.3 g/dL (ref 6.0–8.5)

## 2018-06-30 LAB — BMP8+EGFR
BUN/Creatinine Ratio: 24 (ref 10–24)
BUN: 24 mg/dL (ref 8–27)
CO2: 22 mmol/L (ref 20–29)
Calcium: 9.1 mg/dL (ref 8.6–10.2)
Chloride: 102 mmol/L (ref 96–106)
Creatinine, Ser: 1 mg/dL (ref 0.76–1.27)
GFR calc Af Amer: 90 mL/min/{1.73_m2} (ref >59)
GFR calc non Af Amer: 78 mL/min/{1.73_m2} (ref >59)
Glucose: 168 mg/dL — ABNORMAL HIGH (ref 65–99)
Potassium: 4.1 mmol/L (ref 3.5–5.2)
Sodium: 140 mmol/L (ref 134–144)

## 2018-06-30 LAB — LIPID PANEL
Chol/HDL Ratio: 2.2 ratio (ref 0.0–5.0)
Cholesterol, Total: 135 mg/dL (ref 100–199)
HDL: 61 mg/dL (ref >39)
LDL Calculated: 65 mg/dL (ref 0–99)
Triglycerides: 47 mg/dL (ref 0–149)
VLDL Cholesterol Cal: 9 mg/dL (ref 5–40)

## 2018-06-30 LAB — VITAMIN D 25 HYDROXY (VIT D DEFICIENCY, FRACTURES): Vit D, 25-Hydroxy: 79.6 ng/mL (ref 30.0–100.0)

## 2018-07-01 ENCOUNTER — Encounter: Payer: Self-pay | Admitting: Family Medicine

## 2018-07-01 ENCOUNTER — Ambulatory Visit (INDEPENDENT_AMBULATORY_CARE_PROVIDER_SITE_OTHER): Payer: Medicare Other | Admitting: Family Medicine

## 2018-07-01 ENCOUNTER — Other Ambulatory Visit: Payer: Self-pay

## 2018-07-01 DIAGNOSIS — I10 Essential (primary) hypertension: Secondary | ICD-10-CM | POA: Diagnosis not present

## 2018-07-01 DIAGNOSIS — E782 Mixed hyperlipidemia: Secondary | ICD-10-CM

## 2018-07-01 DIAGNOSIS — I482 Chronic atrial fibrillation, unspecified: Secondary | ICD-10-CM

## 2018-07-01 DIAGNOSIS — E1169 Type 2 diabetes mellitus with other specified complication: Secondary | ICD-10-CM

## 2018-07-01 DIAGNOSIS — N400 Enlarged prostate without lower urinary tract symptoms: Secondary | ICD-10-CM

## 2018-07-01 DIAGNOSIS — E785 Hyperlipidemia, unspecified: Secondary | ICD-10-CM

## 2018-07-01 DIAGNOSIS — C439 Malignant melanoma of skin, unspecified: Secondary | ICD-10-CM

## 2018-07-01 DIAGNOSIS — E119 Type 2 diabetes mellitus without complications: Secondary | ICD-10-CM | POA: Diagnosis not present

## 2018-07-01 DIAGNOSIS — E559 Vitamin D deficiency, unspecified: Secondary | ICD-10-CM

## 2018-07-01 DIAGNOSIS — I480 Paroxysmal atrial fibrillation: Secondary | ICD-10-CM

## 2018-07-01 DIAGNOSIS — N5201 Erectile dysfunction due to arterial insufficiency: Secondary | ICD-10-CM

## 2018-07-01 DIAGNOSIS — Z794 Long term (current) use of insulin: Secondary | ICD-10-CM

## 2018-07-01 NOTE — Patient Instructions (Addendum)
Continue to manage protimes regularly Continue to follow-up with cardiology and endocrinology Continue with aggressive therapeutic lifestyle changes including diet and exercise Continue with regular follow-up for eye checks because of history of diabetes. Get colonoscopies as planned from past history, please call and arrange to get this by the end of the summer Continue to practice good respiratory and hand hygiene Continue to follow-up with ophthalmology Always take a copy of your lab work when you go to see the endocrinologist Always follow-up regularly with the pulmonologist because of past asbestos exposure and make sure that they send [place] a copy of your checks with them.
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
## 2018-07-05 ENCOUNTER — Encounter: Payer: Self-pay | Admitting: Internal Medicine

## 2018-07-05 ENCOUNTER — Encounter: Payer: Self-pay | Admitting: *Deleted

## 2018-07-05 ENCOUNTER — Other Ambulatory Visit: Payer: Self-pay

## 2018-07-05 ENCOUNTER — Ambulatory Visit (INDEPENDENT_AMBULATORY_CARE_PROVIDER_SITE_OTHER): Payer: Medicare Other | Admitting: *Deleted

## 2018-07-05 DIAGNOSIS — Z0000 Encounter for general adult medical examination without abnormal findings: Secondary | ICD-10-CM | POA: Diagnosis not present

## 2018-07-05 DIAGNOSIS — Z1211 Encounter for screening for malignant neoplasm of colon: Secondary | ICD-10-CM
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
## 2018-07-05 NOTE — Patient Instructions (Signed)
  [name] , Thank you for taking time to come for your Medicare Wellness Visit. I appreciate your ongoing commitment to your health goals. Please review the following plan we discussed and let me know if I can assist you in the future.   These are the goals we discussed: Goals    . Patient Stated     Continue healthy diet and activity regimen       This is a list of the screening recommended for you and due dates:  Health Maintenance  Topic Date Due  . Pneumonia vaccines (2 of 2 - PPSV23) 12/04/2017  . Colon Cancer Screening  02/11/2018  . Urine Protein Check  06/24/2018  . Hemoglobin A1C  06/25/2018  .  Hepatitis C: One time screening is recommended by Center for Disease Control  (CDC) for  adults born from [birth year] through 1964.   11/13/2021*  . Flu Shot  09/04/2018  . Eye exam for diabetics  10/30/2018  . Complete foot exam   12/31/2018  . Tetanus Vaccine  07/11/2024  *Topic was postponed. The date shown is not the original due date.

## 2018-07-13 ENCOUNTER — Other Ambulatory Visit: Payer: Self-pay

## 2018-07-14 ENCOUNTER — Encounter: Payer: Self-pay | Admitting: Pharmacist Clinician (PhC)/ Clinical Pharmacy Specialist

## 2018-07-14 ENCOUNTER — Ambulatory Visit (INDEPENDENT_AMBULATORY_CARE_PROVIDER_SITE_OTHER): Payer: Medicare Other | Admitting: Pharmacist Clinician (PhC)/ Clinical Pharmacy Specialist

## 2018-07-14 DIAGNOSIS — I482 Chronic atrial fibrillation, unspecified: Secondary | ICD-10-CM | POA: Diagnosis not present

## 2018-07-14 DIAGNOSIS — Z86718 Personal history of other venous thrombosis and embolism: Secondary | ICD-10-CM

## 2018-07-14 DIAGNOSIS — I4891 Unspecified atrial fibrillation: Secondary | ICD-10-CM | POA: Diagnosis not present

## 2018-07-14 DIAGNOSIS — D6859 Other primary thrombophilia: Secondary | ICD-10-CM

## 2018-07-14 LAB — COAGUCHEK XS/INR WAIVED
INR: 2 — ABNORMAL HIGH (ref 0.9–1.1)
Prothrombin Time: 23.9 s

## 2018-07-14 NOTE — Patient Instructions (Signed)
Description   Continue taking warfarin the same way:  1 tablet every day except for Mondays and Fridays take 1 1/2 tablets.  INR today 2.0 (goal is 2-3)  Perfect reading today

## 2018-07-21 ENCOUNTER — Encounter: Payer: Self-pay | Admitting: Pharmacist Clinician (PhC)/ Clinical Pharmacy Specialist

## 2018-07-23 ENCOUNTER — Other Ambulatory Visit: Payer: Self-pay | Admitting: Family Medicine

## 2018-07-26 NOTE — Telephone Encounter (Signed)
Last office visit 06/25/2018, upcoming appointment 08/25/2018

## 2018-07-30 ENCOUNTER — Telehealth: Payer: Self-pay | Admitting: Emergency Medicine

## 2018-07-30 NOTE — Telephone Encounter (Signed)
Covid-19 screening questions   Do you now or have you had a fever in the last 14 days no   Do you have any respiratory symptoms of shortness of breath or cough now or in the last 14 days no  Do you have any family members or close contacts with diagnosed or suspected Covid-19 in the past 14 days no  Have you been tested for Covid-19 and found to be positive no          

## 2018-07-31 ENCOUNTER — Other Ambulatory Visit: Payer: Self-pay | Admitting: Family Medicine

## 2018-08-02 ENCOUNTER — Encounter: Payer: Self-pay | Admitting: Physician Assistant

## 2018-08-02 ENCOUNTER — Ambulatory Visit (INDEPENDENT_AMBULATORY_CARE_PROVIDER_SITE_OTHER): Payer: Medicare Other | Admitting: Physician Assistant

## 2018-08-02 ENCOUNTER — Telehealth: Payer: Self-pay

## 2018-08-02 VITALS — BP 128/78 | HR 97 | Temp 98.3°F | Ht 73.0 in | Wt 161.5 lb

## 2018-08-02 DIAGNOSIS — Z1211 Encounter for screening for malignant neoplasm of colon: Secondary | ICD-10-CM | POA: Diagnosis not present

## 2018-08-02 DIAGNOSIS — Z7901 Long term (current) use of anticoagulants: Secondary | ICD-10-CM | POA: Diagnosis not present

## 2018-08-02 MED ORDER — PEG 3350-KCL-NA BICARB-NACL 420 G PO SOLR: 4000.0000 mL | Freq: Once | ORAL | 0 refills | Status: AC

## 2018-08-02 NOTE — Telephone Encounter (Signed)
   Primary Cardiologist: Quay Burow, MD  Chart reviewed as part of pre-operative protocol coverage. Patient was contacted 08/02/2018 in reference to pre-operative risk assessment for pending surgery as outlined below.  Christopher Avery was last seen on 03/12/18 by Dr. Gwenlyn Found.  Since that day, Christopher Avery has done well. He does not have a history of CAD. He can complete more than 4.0 METS and denies anginal symptoms.    Per our pharmacy staff: Patient with diagnosis of afib on warfarin for anticoagulation.    Procedure: Colonoscopy Date of procedure: 09/08/2018  CHADS2-VASc score of  3 (CHF, HTN, AGE, DM2, stroke/tia x 2, CAD, AGE, male)  Patient does have a hx of DVT. This appers to be a isolated event  Per office protocol, patient can hold warfarin for 5 days prior to procedure.    Patient will NOT need bridging with Lovenox (enoxaparin) around procedure.   Therefore, based on ACC/AHA guidelines, the patient would be at acceptable risk for the planned procedure without further cardiovascular testing.   I will route this recommendation to the requesting party via Epic fax function and remove from pre-op pool.  Please call with questions.  Tami Lin Meera Vasco, PA 08/02/2018, 1:17 PM

## 2018-08-02 NOTE — Patient Instructions (Addendum)
If you are age 67 or older, your body mass index should be between 23-30. Your Body mass index is 21.31 kg/m. If this is out of the aforementioned range listed, please consider follow up with your Primary Care Provider.  If you are age 73 or younger, your body mass index should be between 19-25. Your Body mass index is 21.31 kg/m. If this is out of the aformentioned range listed, please consider follow up with your Primary Care Provider.   You have been scheduled for a colonoscopy. Please follow written instructions given to you at your visit today.  Please pick up your prep supplies at the pharmacy within the next 1-3 days. If you use inhalers (even only as needed), please bring them with you on the day of your procedure. Your physician has requested that you go to www.startemmi.com and enter the access code given to you at your visit today. This web site gives a general overview about your procedure. However, you should still follow specific instructions given to you by our office regarding your preparation for the procedure.  You will be contacted by our office prior to your procedure for directions on holding your COUMADIN.  If you do not hear from our office 1 week prior to your scheduled procedure, please call [phone] to discuss.   It was a pleasure to see you today!

## 2018-08-02 NOTE — Telephone Encounter (Signed)
Patient with diagnosis of afib on warfarin for anticoagulation.    Procedure: Colonoscopy  Date of procedure: 09/08/2018  CHADS2-VASc score of  3 (CHF, HTN, AGE, DM2, stroke/tia x 2, CAD, AGE, male)  Patient does have a hx of DVT. This appers to be a isolated event  Per office protocol, patient can hold warfarin for 5 days prior to procedure.    Patient will NOT need bridging with Lovenox (enoxaparin) around procedure.

## 2018-08-02 NOTE — Telephone Encounter (Signed)
[facility] Pre-operative Risk Assessment     Request for surgical clearance:     Endoscopy Procedure  What type of surgery is being performed?     Colonoscopy   When is this surgery scheduled?     09-08-2018  What type of clearance is required ?   Pharmacy  Are there any medications that need to be held prior to surgery and how long? Yes, Coumadin, 5 days  Practice name and name of physician performing surgery?      [facility]  What is your office phone and fax number?  Phone- [phone]  [phone]  Anesthesia type (None, local, MAC, general) ?       MAC

## 2018-08-02 NOTE — Telephone Encounter (Signed)
Coumadin for Afib, but managed by Dr. Merita Norton office.   Please provide recommendations on holding/bridging.

## 2018-08-02 NOTE — Progress Notes (Signed)
I agree with the above note, plan 

## 2018-08-02 NOTE — Progress Notes (Signed)
Subjective:    [name]: Christopher Avery, male    DOB: 1951-01-25, 68 y.o.   MRN: [id number]  HPI  Eugene is a pleasant 68 year old white male, new to GI today and referred by Dr. Morrie Sheldon for surveillance colonoscopy.  Patient reports that he had a colonoscopy about 10 years ago he believes with [facility] and was told it was negative. He has no current GI concerns, specifically no problems with abdominal pain or discomfort, no rectal bleeding, melena or hematochezia and no changes in bowel habits.  He denies any chronic heartburn or indigestion and no dysphasia. Patient has history of atrial fibrillation, hypertension, adult onset diabetes mellitus, hypercoagulable disorder and hyperlipidemia.  Also with remote melanoma. He is maintained on chronic Coumadin and followed by Dr. Quay Burow for cardiology. He does relate a grandfather having colon cancer diagnosed in his late 51s.   Review of Systems Pertinent positive and negative review of systems were noted in the above HPI section.  All other review of systems was otherwise negative.  Outpatient Encounter Medications as of 08/02/2018  Medication Sig  . atorvastatin (LIPITOR) 20 MG tablet TAKE 1 TABLET BY MOUTH ONCE A DAY  . Cholecalciferol (VITAMIN D3) 125 MCG (5000 UT) CAPS Take 1 capsule by mouth daily. Takes 6 days a week  . co-enzyme Q-10 30 MG capsule Take 100 mg by mouth daily.   [name] diltiazem (CARDIZEM CD) 240 MG 24 hr capsule Take 1 capsule (240 mg total) by mouth daily. DO NOT START THIS MEDICATION UNTIL AFTER YOU HAVE STOPPED YOUR CARDIZEM CD 180 MG AND SPOKEN WITH A HEARTCARE CLINICAL PHARMACIST  . Insulin Glargine (BASAGLAR KWIKPEN) 100 UNIT/ML SOPN Inject 17 Units into the skin every evening.   . insulin lispro (HUMALOG KWIKPEN) 100 UNIT/ML KwikPen Inject into the skin. Inject 6 to 10 units TID per sliding scale as directed.  . Insulin Pen Needle (BD PEN NEEDLE NANO U/F) 32G X 4 MM MISC USE WITH INSULIN 4 TIMES A DAY Dx E11.9   . warfarin (COUMADIN) 2.5 MG tablet Take 1 tablet (2.5 mg total) by mouth daily. Take 1 1/2 tablets on Mondays and Fridays and 1 tablet all other days of the week.  . fluticasone (FLONASE) 50 MCG/ACT nasal spray SPRAY 2 SPRAYS INTO EACH NOSTRIL EVERY DAY (Patient not taking: Reported on 08/02/2018)  . polyethylene glycol-electrolytes (NULYTELY/GOLYTELY) 420 g solution Take 4,000 mLs by mouth once for 1 dose.  . sildenafil (REVATIO) 20 MG tablet TAKE 2-5 TABLETS AS NEEDED PRIOR TO SEXUAL ACTIVITY (Patient not taking: Reported on 08/02/2018)  . valACYclovir (VALTREX) 500 MG tablet TAKE 4 TABLETS BY MOUTH TWICE DAILY AS DIRECTED AND AS NEEDED (Patient not taking: Reported on 08/02/2018)  . [DISCONTINUED] Insulin Pen Needle (BD PEN NEEDLE NANO U/F) 32G X 4 MM MISC USE WITH INSULIN 4 TIMES A DAY  . [DISCONTINUED] sildenafil (REVATIO) 20 MG tablet TAKE 2-5 TABLETS AS NEEDED PRIOR TO SEXUAL ACTIVITY   No facility-administered encounter medications on file as of 08/02/2018.    Allergies  Allergen Reactions  . Januvia [Sitagliptin] Hives   Patient Active Problem List   Diagnosis Date Noted  . Corn of foot 06/15/2018  . Pain in right foot 06/15/2018  . Erectile dysfunction due to arterial insufficiency 06/23/2017  . Asymmetrical right sensorineural hearing loss 03/25/2017  . Subjective tinnitus of both ears 03/25/2017  . Primary hypercoagulable state ([place]) [D68.59] 03/04/2016  . History of DVT of lower extremity 03/04/2016  . Melanoma of  skin ([place]) 11/14/2015  . Chronic atrial fibrillation 07/04/2015  . Incisional hernia, without obstruction or gangrene 11/29/2013  . Vitamin D deficiency 11/29/2013  . Type 2 diabetes mellitus treated with insulin ([place]) 07/29/2012  . Long term (current) use of anticoagulants 04/22/2012  . Hyperlipidemia 06/23/2008  . Benign essential HTN 06/23/2008  . Paroxysmal atrial fibrillation ([place]) 06/23/2008  . Asbestos exposure 06/23/2008   Social History    Socioeconomic History  . Marital status: Married    Spouse name: Not on file  . Number of children: 2  . Years of education: 14 years  . Highest education level: Some college, no degree  Occupational History  . Occupation: Retired    Fish farm manager: [employer]  Social Needs  . Financial resource strain: Not hard at all  . Food insecurity    Worry: Never true    Inability: Never true  . Transportation needs    Medical: No    Non-medical: No  Tobacco Use  . Smoking status: Never Smoker  . Smokeless tobacco: Never Used  Substance and Sexual Activity  . Alcohol use: Never    Alcohol/week: 0.0 standard drinks    Frequency: Never  . Drug use: No  . Sexual activity: Yes  Lifestyle  . Physical activity    Days per week: 6 days    Minutes per session: 60 min  . Stress: Not at all  Relationships  . Social connections    Talks on phone: More than three times a week    Gets together: More than three times a week    Attends religious service: More than 4 times per year    Active member of club or organization: Yes    Attends meetings of clubs or organizations: More than 4 times per year    Relationship status: Married  . Intimate partner violence    Fear of current or ex partner: No    Emotionally abused: No    Physically abused: No    Forced sexual activity: No  Other Topics Concern  . Not on file  Social History Narrative  . Not on file    Mr. Fidel family history includes Atrial fibrillation in his brother and mother; Colon cancer in his paternal grandfather; Diabetes in his brother; Heart disease in his father; Skin cancer in his brother.      Objective:    Vitals:   08/02/18 0956  BP: 128/78  Pulse: 97  Temp: 98.3 F (36.8 C)    Physical Exam Well-developed well-nourished older White male in no acute distress.   BMI 21.3  HEENT; nontraumatic normocephalic, EOMI, PE R LA, sclera anicteric. Oropharynx;not examined ,wearing mask /covid Neck; supple, no JVD  Cardiovascular; IRRregular rate and rhythm with S1-S2, no murmur rub or gallop Pulmonary; Clear bilaterally Abdomen; soft, nontender, nondistended, no palpable mass or hepatosplenomegaly, bowel sounds are active Rectal; not done Skin; benign exam, no jaundice rash or appreciable lesions Extremities; no clubbing cyanosis or edema skin warm and dry Neuro/Psych; alert and oriented x4, grossly nonfocal mood and affect appropriate       Assessment & Plan:   22. 68 year old white male referred for colon cancer screening, last colonoscopy 10 years ago per [facility] reported as negative. Patient is currently asymptomatic  2. chronic anticoagulation-on Coumadin 3.  Atrial fibrillation 4.  History of hypercoagulable disorder 5.  Hypertension 6.  Adult onset diabetes mellitus 7.  History of melanoma  Plan; Patient will be scheduled for colonoscopy with Dr. Ardis Hughs.  Procedure was discussed in detail with the patient including indications risks and benefits and he is agreeable to proceed. We will need to hold Coumadin for 5 days prior to the procedure.  We will communicate with his cardiologist, Dr. Quay Burow to assure this is reasonable for this patient. Patient has signed a release and will obtain copy of his prior colonoscopy.  Monzerrath Mcburney Genia Harold PA-C 08/02/2018   Cc: Chipper Herb, MD

## 2018-08-04 NOTE — Telephone Encounter (Signed)
L/M to call.

## 2018-08-10 NOTE — Telephone Encounter (Signed)
Pt has been notified and aware. He states clear understanding on holding his blood thinner 5 days before his procedure.

## 2018-08-16 ENCOUNTER — Encounter: Payer: Medicare Other | Admitting: Internal Medicine

## 2018-08-24 ENCOUNTER — Telehealth: Payer: Self-pay | Admitting: Family Medicine

## 2018-08-24 NOTE — Telephone Encounter (Signed)
appt made

## 2018-08-25 ENCOUNTER — Encounter: Payer: Self-pay | Admitting: Family Medicine

## 2018-08-25 ENCOUNTER — Other Ambulatory Visit: Payer: Self-pay | Admitting: Family Medicine

## 2018-08-27 ENCOUNTER — Other Ambulatory Visit: Payer: Self-pay

## 2018-08-27 ENCOUNTER — Ambulatory Visit (INDEPENDENT_AMBULATORY_CARE_PROVIDER_SITE_OTHER): Payer: Medicare Other | Admitting: Family Medicine

## 2018-08-27 ENCOUNTER — Encounter: Payer: Self-pay | Admitting: Family Medicine

## 2018-08-27 VITALS — BP 117/80 | HR 86 | Temp 99.1°F | Ht 73.0 in | Wt 159.2 lb

## 2018-08-27 DIAGNOSIS — I482 Chronic atrial fibrillation, unspecified: Secondary | ICD-10-CM

## 2018-08-27 DIAGNOSIS — Z86718 Personal history of other venous thrombosis and embolism: Secondary | ICD-10-CM | POA: Diagnosis not present

## 2018-08-27 DIAGNOSIS — D6859 Other primary thrombophilia: Secondary | ICD-10-CM

## 2018-08-27 NOTE — Progress Notes (Signed)
BP 117/80   Pulse 86   Temp 99.1 F (37.3 C) (Temporal)   Ht 6\' 1"  (1.854 m)   Wt 159 lb 3.2 oz (72.2 kg)   BMI 21.00 kg/m    Subjective:   [name]: Christopher Avery Avery, male    DOB: [birth year], 68 y.o.   MRN: [id number]  HPI: Christopher Avery Avery is a 68 y.o. male presenting on 08/27/2018 for Coagulation Disorder   HPI Coumadin recheck anticoagulation Patient is coming in today for Coumadin recheck and anticoagulation disorder.  He denies any bruising or bleeding or palpitations or flutters.  He is on it for chronic A. fib that is paroxysmal.  He says is been doing well and denies any major issues with that.  His heart rate today is 86 and regular.  Relevant past medical, surgical, family and social history reviewed and updated as indicated. Interim medical history since our last visit reviewed. Allergies and medications reviewed and updated.  Review of Systems  Constitutional: Negative for chills and fever.  Respiratory: Negative for shortness of breath and wheezing.   Cardiovascular: Negative for chest pain, palpitations and leg swelling.  Musculoskeletal: Negative for back pain and gait problem.  Skin: Negative for rash.  All other systems reviewed and are negative.   Per HPI unless specifically indicated above   Allergies as of 08/27/2018      Reactions   Januvia [sitagliptin] Hives      Medication List       Accurate as of August 27, 2018 10:30 AM. If you have any questions, ask your nurse or doctor.        atorvastatin 20 MG tablet Commonly known as: LIPITOR TAKE 1 TABLET BY MOUTH ONCE A DAY   Basaglar KwikPen 100 UNIT/ML Sopn Inject 17 Units into the skin every evening.   BD Pen Needle Nano U/F 32G X 4 MM Misc Generic drug: Insulin Pen Needle USE WITH INSULIN 4 TIMES A DAY Dx E11.9   co-enzyme Q-10 30 MG capsule Take 100 mg by mouth daily.   diltiazem 240 MG 24 hr capsule Commonly known as: CARDIZEM CD Take 1 capsule (240 mg total) by mouth daily. DO  NOT START THIS MEDICATION UNTIL AFTER YOU HAVE STOPPED YOUR CARDIZEM CD 180 MG AND SPOKEN WITH A HEARTCARE CLINICAL PHARMACIST   fluticasone 50 MCG/ACT nasal spray Commonly known as: FLONASE SPRAY 2 SPRAYS INTO EACH NOSTRIL EVERY DAY   HumaLOG KwikPen 100 UNIT/ML KwikPen Generic drug: insulin lispro Inject into the skin. Inject 6 to 10 units TID per sliding scale as directed.   sildenafil 20 MG tablet Commonly known as: REVATIO TAKE 2-5 TABLETS AS NEEDED PRIOR TO SEXUAL ACTIVITY   valACYclovir 500 MG tablet Commonly known as: VALTREX TAKE 4 TABLETS BY MOUTH TWICE DAILY AS DIRECTED AND AS NEEDED   Vitamin D3 125 MCG (5000 UT) Caps Take 1 capsule by mouth daily. Takes 6 days a week   warfarin 2.5 MG tablet Commonly known as: Coumadin Take as directed by the anticoagulation clinic. If you are unsure how to take this medication, talk to your nurse or doctor. Original instructions: Take 1 tablet (2.5 mg total) by mouth daily. Take 1 1/2 tablets on Mondays and Fridays and 1 tablet all other days of the week.        Objective:   BP 117/80   Pulse 86   Temp 99.1 F (37.3 C) (Temporal)   Ht 6\' 1"  (1.854 m)   Wt 159 lb 3.2  oz (72.2 kg)   BMI 21.00 kg/m   Wt Readings from Last 3 Encounters:  08/27/18 159 lb 3.2 oz (72.2 kg)  08/02/18 161 lb 8 oz (73.3 kg)  06/15/18 159 lb (72.1 kg)    Physical Exam Vitals signs and nursing note reviewed.  Constitutional:      General: He is not in acute distress.    Appearance: He is well-developed. He is not diaphoretic.  Eyes:     General: No scleral icterus.    Conjunctiva/sclera: Conjunctivae normal.  Neck:     Musculoskeletal: Neck supple.     Thyroid: No thyromegaly.  Cardiovascular:     Rate and Rhythm: Normal rate and regular rhythm.     Heart sounds: Normal heart sounds. No murmur.  Pulmonary:     Effort: Pulmonary effort is normal. No respiratory distress.     Breath sounds: Normal breath sounds. No wheezing.   Lymphadenopathy:     Cervical: No cervical adenopathy.  Neurological:     Mental Status: He is alert and oriented to person, place, and time.     Coordination: Coordination normal.  Psychiatric:        Behavior: Behavior normal.       Assessment & Plan:   Problem List Items Addressed This Visit      Cardiovascular and Mediastinum   Chronic atrial fibrillation     Hematopoietic and Hemostatic   Primary hypercoagulable state ([place]) [D68.59]     Other   History of DVT of lower extremity - Primary   Relevant Orders   Protime-INR      Coumadin is send out because her machine is still broken, will contact patient with results tomorrow Follow up plan: Return if symptoms worsen or fail to improve.  Counseling provided for all of the vaccine components Orders Placed This Encounter  Procedures  . [place] Dettinger, MD [facility] 08/27/2018, 10:30 AM

## 2018-08-28 ENCOUNTER — Telehealth: Payer: Self-pay | Admitting: Family Medicine

## 2018-08-28 DIAGNOSIS — D6859 Other primary thrombophilia: Secondary | ICD-10-CM

## 2018-08-28 DIAGNOSIS — Z86718 Personal history of other venous thrombosis and embolism: Secondary | ICD-10-CM

## 2018-08-28 DIAGNOSIS — I482 Chronic atrial fibrillation, unspecified: Secondary | ICD-10-CM

## 2018-08-28 LAB — PROTIME-INR
INR: 2.7 — ABNORMAL HIGH (ref 0.8–1.2)
Prothrombin Time: 28 s — ABNORMAL HIGH (ref 9.1–12.0)

## 2018-08-28 NOTE — Telephone Encounter (Signed)
Description   Continue taking warfarin the same way:  1 tablet every day except for Mondays and Fridays take 1 1/2 tablets.  INR today 2.7 (goal is 2-3)  Perfect reading today  Return in 4 to 6 weeks    Patient was notified of results Caryl Pina, MD [facility] 08/28/2018, 7:50 AM

## 2018-09-07 ENCOUNTER — Telehealth: Payer: Self-pay | Admitting: Gastroenterology

## 2018-09-07 NOTE — Telephone Encounter (Signed)
Pt is scheduled for a colonoscopy tomorrow and reported that he has not stopped Coumadin.

## 2018-09-07 NOTE — Telephone Encounter (Signed)
It is too bad that he did not stop taking his Coumadin.  I recommend that we reschedule his procedure at a later date so that he is able to comply with the 5-day Coumadin hold as we had discussed.

## 2018-09-07 NOTE — Telephone Encounter (Signed)
Patient forgot about holding his coumadin and has taken it daily as usual. His last dose was yesterday. He is scheduled for a colonoscopy tomorrow at 8:00am. He said he recently went to have his blood checked and that it was good. I told him I would let MD know and get back with him.

## 2018-09-07 NOTE — Telephone Encounter (Signed)
Called patient back and explained that we will need to reschedule. Procedure for tomorrow was cancelled and rescheduled for 09/20/2018. I went over all new instructions with patient and his wife regarding what times to take his new prep and when to stop his coumadin as well as certain foods. They both understood and were very apologetic. Also called a new golytely into his pharmacy.

## 2018-09-07 NOTE — Telephone Encounter (Signed)

## 2018-09-08 ENCOUNTER — Encounter: Payer: Medicare Other | Admitting: Gastroenterology

## 2018-09-17 ENCOUNTER — Telehealth: Payer: Self-pay | Admitting: Gastroenterology

## 2018-09-17 NOTE — Telephone Encounter (Signed)

## 2018-09-20 ENCOUNTER — Other Ambulatory Visit: Payer: Self-pay

## 2018-09-20 ENCOUNTER — Ambulatory Visit (AMBULATORY_SURGERY_CENTER): Payer: Medicare Other | Admitting: Gastroenterology

## 2018-09-20 ENCOUNTER — Encounter: Payer: Self-pay | Admitting: Gastroenterology

## 2018-09-20 VITALS — BP 128/82 | HR 77 | Temp 96.2°F | Resp 9 | Ht 73.0 in | Wt 161.0 lb

## 2018-09-20 DIAGNOSIS — D123 Benign neoplasm of transverse colon: Secondary | ICD-10-CM

## 2018-09-20 DIAGNOSIS — Z1211 Encounter for screening for malignant neoplasm of colon: Secondary | ICD-10-CM

## 2018-09-20 MED ORDER — SODIUM CHLORIDE 0.9 % IV SOLN: 500.0000 mL | Freq: Once | INTRAVENOUS | Status: DC

## 2018-09-20 NOTE — Op Note (Addendum)
[facility] Patient Name: Christopher Avery Procedure Date: 09/20/2018 2:06 PM MRN: [id number] Endoscopist: Milus Banister , MD Age: 68 Referring MD:  Date of Birth: 04-Aug-1950 Gender: Male Account #: [id number] Procedure:                Colonoscopy Indications:              Screening for colorectal malignant neoplasm Medicines:                Monitored Anesthesia Care Procedure:                Pre-Anesthesia Assessment:                           - Prior to the procedure, a History and Physical                            was performed, and patient medications and                            allergies were reviewed. The patient's tolerance of                            previous anesthesia was also reviewed. The risks                            and benefits of the procedure and the sedation                            options and risks were discussed with the patient.                            All questions were answered, and informed consent                            was obtained. Prior Anticoagulants: The patient has                            taken Coumadin (warfarin), last dose was 5 days                            prior to procedure. ASA Grade Assessment: II - A                            patient with mild systemic disease. After reviewing                            the risks and benefits, the patient was deemed in                            satisfactory condition to undergo the procedure.                           After obtaining informed consent, the colonoscope  was passed under direct vision. Throughout the                            procedure, the patient's blood pressure, pulse, and                            oxygen saturations were monitored continuously. The                            Colonoscope was introduced through the anus and                            advanced to the the cecum, identified by                            appendiceal  orifice and ileocecal valve. The                            colonoscopy was performed without difficulty. The                            patient tolerated the procedure well. The quality                            of the bowel preparation was good. The ileocecal                            valve, appendiceal orifice, and rectum were                            photographed. Scope In: 2:09:41 PM Scope Out: 2:20:02 PM Scope Withdrawal Time: 0 hours 8 minutes 8 seconds  Total Procedure Duration: 0 hours 10 minutes 21 seconds  Findings:                 Three sessile polyps were found in the transverse                            colon. The polyps were 2 to 4 mm in size. These                            polyps were removed with a cold snare. Resection                            and retrieval were complete.                           Multiple small-mouthed diverticula were found in                            the left colon.                           External and internal hemorrhoids were found. The  hemorrhoids were medium-sized.                           The exam was otherwise without abnormality on                            direct and retroflexion views. Complications:            No immediate complications. Estimated blood loss:                            None. Estimated Blood Loss:     Estimated blood loss: none. Impression:               - Three 2 to 4 mm polyps in the transverse colon,                            removed with a cold snare. Resected and retrieved.                           - Diverticulosis in the left colon.                           - External and internal hemorrhoids.                           - The examination was otherwise normal on direct                            and retroflexion views. Recommendation:           - Patient has a contact number available for                            emergencies. The signs and symptoms of potential                             delayed complications were discussed with the                            patient. Return to normal activities tomorrow.                            Written discharge instructions were provided to the                            patient.                           - Resume previous diet.                           - Continue present medications. OK to resume your                            coumadin today.                           -  Repeat colonoscopy is recommended. The                            colonoscopy date will be determined after pathology                            results from today's exam become available for                            review. Likely 04-09-08 years. Milus Banister, MD 09/20/2018 2:22:46 PM This report has been signed electronically.

## 2018-09-20 NOTE — Progress Notes (Signed)
Report given to PACU, vss 

## 2018-09-20 NOTE — Patient Instructions (Addendum)
Read all handouts given to you by your recovery room nurse.   You may restart your coumadin today per Dr. Ardis Hughs.  YOU HAD AN ENDOSCOPIC PROCEDURE TODAY AT [facility]:   Refer to the procedure report that was given to you for any specific questions about what was found during the examination.  If the procedure report does not answer your questions, please call your gastroenterologist to clarify.  If you requested that your care partner not be given the details of your procedure findings, then the procedure report has been included in a sealed envelope for you to review at your convenience later.  YOU SHOULD EXPECT: Some feelings of bloating in the abdomen. Passage of more gas than usual.  Walking can help get rid of the air that was put into your GI tract during the procedure and reduce the bloating. If you had a lower endoscopy (such as a colonoscopy or flexible sigmoidoscopy) you may notice spotting of blood in your stool or on the toilet paper. If you underwent a bowel prep for your procedure, you may not have a normal bowel movement for a few days.  Please Note:  You might notice some irritation and congestion in your nose or some drainage.  This is from the oxygen used during your procedure.  There is no need for concern and it should clear up in a day or so.  SYMPTOMS TO REPORT IMMEDIATELY:   Following lower endoscopy (colonoscopy or flexible sigmoidoscopy):  Excessive amounts of blood in the stool  Significant tenderness or worsening of abdominal pains  Swelling of the abdomen that is new, acute  Fever of 100F or higher   For urgent or emergent issues, a gastroenterologist can be reached at any hour by calling [phone].   DIET:  We do recommend a small meal at first, but then you may proceed to your regular diet.  Drink plenty of fluids but you should avoid alcoholic beverages for 24 hours. Try to increase the fiber in your diet, and drink plenty of  water.  ACTIVITY:  You should plan to take it easy for the rest of today and you should NOT DRIVE or use heavy machinery until tomorrow (because of the sedation medicines used during the test).    FOLLOW UP: Our staff will call the number listed on your records 48-72 hours following your procedure to check on you and address any questions or concerns that you may have regarding the information given to you following your procedure. If we do not reach you, we will leave a message.  We will attempt to reach you two times.  During this call, we will ask if you have developed any symptoms of COVID 19. If you develop any symptoms (ie: fever, flu-like symptoms, shortness of breath, cough etc.) before then, please call [phone].  If you test positive for Covid 19 in the 2 weeks post procedure, please call and report this information to [place].    If any biopsies were taken you will be contacted by phone or by letter within the next 1-3 weeks.  Please call us at [phone] if you have not heard about the biopsies in 3 weeks.    SIGNATURES/CONFIDENTIALITY: You and/or your care partner have signed paperwork which will be entered into your electronic medical record.  These signatures attest to the fact that that the information above on your After Visit Summary has been reviewed and is understood.  Full responsibility of the confidentiality of  this discharge information lies with you and/or your care-partner.

## 2018-09-22 ENCOUNTER — Telehealth: Payer: Self-pay | Admitting: *Deleted

## 2018-09-22 NOTE — Telephone Encounter (Signed)
  Follow up Call-  Call back number 09/20/2018  Post procedure Call Back phone  # [phone]  Permission to leave phone message Yes  Some recent data might be hidden     Patient questions:  Do you have a fever, pain , or abdominal swelling? No. Pain Score  0 *  Have you tolerated food without any problems? Yes.    Have you been able to return to your normal activities? Yes.    Do you have any questions about your discharge instructions: Diet   No. Medications  No. Follow up visit  No.  Do you have questions or concerns about your Care? No.  Actions: * If pain score is 4 or above: No action needed, pain <4.  1. Have you developed a fever since your procedure? no  2.   Have you had an respiratory symptoms (SOB or cough) since your procedure? no  3.   Have you tested positive for COVID 19 since your procedure no  4.   Have you had any family members/close contacts diagnosed with the COVID 19 since your procedure?  no   If yes to any of these questions please route to Joylene John, RN and Alphonsa Gin, Therapist, sports.

## 2018-09-26 ENCOUNTER — Other Ambulatory Visit: Payer: Self-pay | Admitting: Family Medicine

## 2018-09-27 ENCOUNTER — Encounter: Payer: Self-pay | Admitting: Gastroenterology

## 2018-10-28 ENCOUNTER — Ambulatory Visit (INDEPENDENT_AMBULATORY_CARE_PROVIDER_SITE_OTHER): Payer: Medicare Other | Admitting: Family Medicine

## 2018-10-28 ENCOUNTER — Encounter: Payer: Self-pay | Admitting: Family Medicine

## 2018-10-28 DIAGNOSIS — I482 Chronic atrial fibrillation, unspecified: Secondary | ICD-10-CM

## 2018-10-28 DIAGNOSIS — Z86718 Personal history of other venous thrombosis and embolism: Secondary | ICD-10-CM

## 2018-10-28 DIAGNOSIS — I4891 Unspecified atrial fibrillation: Secondary | ICD-10-CM | POA: Diagnosis not present

## 2018-10-28 DIAGNOSIS — D6859 Other primary thrombophilia: Secondary | ICD-10-CM | POA: Diagnosis not present

## 2018-10-28 LAB — COAGUCHEK XS/INR WAIVED
INR: 2.9 — ABNORMAL HIGH (ref 0.9–1.1)
Prothrombin Time: 35.2 s

## 2018-10-28 NOTE — Addendum Note (Signed)
Addended by: Nigel Berthold C on: 10/28/2018 10:28 AM   Modules accepted: Orders

## 2018-10-28 NOTE — Progress Notes (Signed)
Virtual Visit via telephone Note  I connected with Christopher Avery on 10/28/18 at 1010 by telephone and verified that I am speaking with the correct person using two identifiers. Christopher Avery is currently located at home and no other people are currently with her during visit. The provider, Fransisca Kaufmann , MD is located in their office at time of visit.  Call ended at 1015  I discussed the limitations, risks, security and privacy concerns of performing an evaluation and management service by telephone and the availability of in person appointments. I also discussed with the patient that there may be a patient responsible charge related to this service. The patient expressed understanding and agreed to proceed.   History and Present Illness: Patient is calling in for a recheck of his INR.  He had it done earlier today in the office and is 2.9.  He denies any major bruising or bleeding and says that his dose is still doing very well for him.    Outpatient Encounter Medications as of 10/28/2018  Medication Sig  . atorvastatin (LIPITOR) 20 MG tablet TAKE 1 TABLET BY MOUTH ONCE A DAY  . Cholecalciferol (VITAMIN D3) 125 MCG (5000 UT) CAPS Take 1 capsule by mouth daily. Takes 6 days a week  . co-enzyme Q-10 30 MG capsule Take 100 mg by mouth daily.   [name] diltiazem (CARDIZEM CD) 240 MG 24 hr capsule Take 1 capsule (240 mg total) by mouth daily. DO NOT START THIS MEDICATION UNTIL AFTER YOU HAVE STOPPED YOUR CARDIZEM CD 180 MG AND SPOKEN WITH A HEARTCARE CLINICAL PHARMACIST  . fluticasone (FLONASE) 50 MCG/ACT nasal spray SPRAY 2 SPRAYS INTO EACH NOSTRIL EVERY DAY  . Insulin Glargine (BASAGLAR KWIKPEN) 100 UNIT/ML SOPN Inject 17 Units into the skin every evening.   . insulin lispro (HUMALOG KWIKPEN) 100 UNIT/ML KwikPen Inject into the skin. Inject 6 to 10 units TID per sliding scale as directed.  . Insulin Pen Needle (BD PEN NEEDLE NANO U/F) 32G X 4 MM MISC USE WITH INSULIN 4 TIMES A DAY Dx E11.9   . sildenafil (REVATIO) 20 MG tablet TAKE 2-5 TABLETS AS NEEDED PRIOR TO SEXUAL ACTIVITY  . valACYclovir (VALTREX) 500 MG tablet TAKE 4 TABLETS BY MOUTH TWICE DAILY AS DIRECTED AND AS NEEDED  . warfarin (COUMADIN) 2.5 MG tablet Take 1 tablet (2.5 mg total) by mouth daily. Take 1 1/2 tablets on Mondays and Fridays and 1 tablet all other days of the week.  . [DISCONTINUED] sildenafil (REVATIO) 20 MG tablet TAKE 2-5 TABLETS AS NEEDED PRIOR TO SEXUAL ACTIVITY   No facility-administered encounter medications on file as of 10/28/2018.     Review of Systems  Constitutional: Negative for chills and fever.  Respiratory: Negative for shortness of breath and wheezing.   Cardiovascular: Negative for chest pain and leg swelling.  Gastrointestinal: Negative for anal bleeding and blood in stool.  Genitourinary: Negative for hematuria.  Musculoskeletal: Negative for back pain and gait problem.  Skin: Negative for rash.  All other systems reviewed and are negative.   Observations/Objective: Patient sounds comfortable and in no acute distress Description   Continue taking warfarin the same way:  1 tablet every day except for Mondays and Fridays take 1 1/2 tablets.  INR today 2.9 (goal is 2-3)  Perfect reading today  Return in 4 to 6 weeks     Assessment and Plan: Problem List Items Addressed This Visit      Cardiovascular and Mediastinum   Chronic atrial  fibrillation     Hematopoietic and Hemostatic   Primary hypercoagulable state ([place]) [D68.59]     Other   History of DVT of lower extremity    Other Visit Diagnoses    Atrial fibrillation, unspecified type ([place])           Follow Up Instructions:  Follow-up in 6 weeks for INR recheck   I discussed the assessment and treatment plan with the patient. The patient was provided an opportunity to ask questions and all were answered. The patient agreed with the plan and demonstrated an understanding of the instructions.   The patient was  advised to call back or seek an in-person evaluation if the symptoms worsen or if the condition fails to improve as anticipated.  The above assessment and management plan was discussed with the patient. The patient verbalized understanding of and has agreed to the management plan. Patient is aware to call the clinic if symptoms persist or worsen. Patient is aware when to return to the clinic for a follow-up visit. Patient educated on when it is appropriate to go to the emergency department.    I provided 5 minutes of non-face-to-face time during this encounter.    Worthy Rancher, MD

## 2018-12-08 ENCOUNTER — Telehealth: Payer: Self-pay | Admitting: Family Medicine

## 2018-12-08 NOTE — Telephone Encounter (Signed)
Patient requesting Basaglar and Humalog samples. Basaglar only insulin available. Left sample in fridge for patient pick up. Pt notified

## 2018-12-14 ENCOUNTER — Ambulatory Visit: Payer: Medicare Other

## 2018-12-16 ENCOUNTER — Other Ambulatory Visit: Payer: Self-pay | Admitting: Family Medicine

## 2019-01-04 ENCOUNTER — Other Ambulatory Visit: Payer: Self-pay

## 2019-01-04 ENCOUNTER — Ambulatory Visit: Payer: Medicare Other | Admitting: Orthopaedic Surgery

## 2019-01-04 ENCOUNTER — Ambulatory Visit: Payer: Self-pay

## 2019-01-04 ENCOUNTER — Encounter: Payer: Self-pay | Admitting: Orthopaedic Surgery

## 2019-01-04 VITALS — Ht 73.0 in | Wt 161.0 lb

## 2019-01-04 DIAGNOSIS — M19042 Primary osteoarthritis, left hand: Secondary | ICD-10-CM | POA: Insufficient documentation

## 2019-01-04 DIAGNOSIS — M11232 Other chondrocalcinosis, left wrist: Secondary | ICD-10-CM | POA: Insufficient documentation

## 2019-01-04 DIAGNOSIS — M79642 Pain in left hand: Secondary | ICD-10-CM | POA: Diagnosis not present
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
## 2019-01-11 ENCOUNTER — Telehealth: Payer: Self-pay | Admitting: Family Medicine

## 2019-01-11 NOTE — Telephone Encounter (Signed)
Pt aware ntbs and scheduled appt with Dr D tomorrow at 8:25 and also put a sample in the fridge in lab for pt tomorrow.

## 2019-01-11 NOTE — Telephone Encounter (Signed)
We do have samples of Basaglar in the fridge. Pt hasn't had an A1C since 12/2017, last INR check was 10/28/18. He cancelled his appt on 12/14/18. Ok to give sample or does pt ntbs?

## 2019-01-11 NOTE — Telephone Encounter (Signed)
Go ahead and give him 1 sample and then have him schedule an appointment as soon as possible

## 2019-01-12 ENCOUNTER — Other Ambulatory Visit: Payer: Self-pay

## 2019-01-12 ENCOUNTER — Ambulatory Visit (INDEPENDENT_AMBULATORY_CARE_PROVIDER_SITE_OTHER): Payer: Medicare Other | Admitting: Family Medicine

## 2019-01-12 ENCOUNTER — Encounter: Payer: Self-pay | Admitting: Family Medicine

## 2019-01-12 VITALS — BP 131/83 | HR 80 | Temp 97.3°F | Ht 73.0 in | Wt 156.2 lb

## 2019-01-12 DIAGNOSIS — Z794 Long term (current) use of insulin: Secondary | ICD-10-CM

## 2019-01-12 DIAGNOSIS — Z7901 Long term (current) use of anticoagulants: Secondary | ICD-10-CM

## 2019-01-12 DIAGNOSIS — I4891 Unspecified atrial fibrillation: Secondary | ICD-10-CM | POA: Diagnosis not present

## 2019-01-12 DIAGNOSIS — D6859 Other primary thrombophilia: Secondary | ICD-10-CM

## 2019-01-12 DIAGNOSIS — E782 Mixed hyperlipidemia: Secondary | ICD-10-CM

## 2019-01-12 DIAGNOSIS — Z86718 Personal history of other venous thrombosis and embolism: Secondary | ICD-10-CM

## 2019-01-12 DIAGNOSIS — I482 Chronic atrial fibrillation, unspecified: Secondary | ICD-10-CM

## 2019-01-12 DIAGNOSIS — E119 Type 2 diabetes mellitus without complications: Secondary | ICD-10-CM

## 2019-01-12 DIAGNOSIS — I10 Essential (primary) hypertension: Secondary | ICD-10-CM

## 2019-01-12 LAB — COAGUCHEK XS/INR WAIVED
INR: 2.5 — ABNORMAL HIGH (ref 0.9–1.1)
Prothrombin Time: 29.6 s

## 2019-01-12 MED ORDER — ATORVASTATIN CALCIUM 20 MG PO TABS: 20.0000 mg | ORAL_TABLET | Freq: Every day | ORAL | 3 refills | Status: DC

## 2019-01-12 NOTE — Progress Notes (Signed)
BP 131/83   Pulse 80   Temp (!) 97.3 F (36.3 C) (Temporal)   Ht '6\' 1"'  (1.854 m)   Wt 156 lb 3.2 oz (70.9 kg)   SpO2 100%   BMI 20.61 kg/m    Subjective:   [name]: Christopher Avery, male    DOB: November 27, 1950, 68 y.o.   MRN: [id number]  HPI: Christopher Avery is a 68 y.o. male presenting on 01/12/2019 for Coagulation Disorder and Medical Management of Chronic Issues (check up of chronic medical conditions)   HPI Coumadin recheck Target goal: 2.0-3.0 Reason on anticoagulation: A. fib and history of DVT Patient denies any bruising or bleeding or chest pain or palpitations   Hyperlipidemia Patient is coming in for recheck of his hyperlipidemia. The patient is currently taking atorvastatin. They deny any issues with myalgias or history of liver damage from it. They deny any focal numbness or weakness or chest pain.   Hypertension and A. fib Patient is currently on diltiazem 240, and their blood pressure today is 131/83. Patient denies any lightheadedness or dizziness. Patient denies headaches, blurred vision, chest pains, shortness of breath, or weakness. Denies any side effects from medication and is content with current medication.   Relevant past medical, surgical, family and social history reviewed and updated as indicated. Interim medical history since our last visit reviewed. Allergies and medications reviewed and updated.  Review of Systems  Constitutional: Negative for chills and fever.  Eyes: Negative for discharge.  Respiratory: Negative for shortness of breath and wheezing.   Cardiovascular: Positive for palpitations. Negative for chest pain and leg swelling.  Musculoskeletal: Negative for back pain and gait problem.  Skin: Negative for rash.  Neurological: Negative for dizziness, weakness and light-headedness.  All other systems reviewed and are negative.   Per HPI unless specifically indicated above   Allergies as of 01/12/2019      Reactions   Januvia  [sitagliptin] Hives      Medication List       Accurate as of January 12, 2019  9:06 AM. If you have any questions, ask your nurse or doctor.        atorvastatin 20 MG tablet Commonly known as: LIPITOR Take 1 tablet (20 mg total) by mouth daily.   Basaglar KwikPen 100 UNIT/ML Sopn Inject 17 Units into the skin every evening.   BD Pen Needle Nano U/F 32G X 4 MM Misc Generic drug: Insulin Pen Needle USE WITH INSULIN 4 TIMES A DAY Dx E11.9   co-enzyme Q-10 30 MG capsule Take 100 mg by mouth daily.   diltiazem 240 MG 24 hr capsule Commonly known as: CARDIZEM CD Take 1 capsule (240 mg total) by mouth daily. DO NOT START THIS MEDICATION UNTIL AFTER YOU HAVE STOPPED YOUR CARDIZEM CD 180 MG AND SPOKEN WITH A HEARTCARE CLINICAL PHARMACIST   fluticasone 50 MCG/ACT nasal spray Commonly known as: FLONASE SPRAY 2 SPRAYS INTO EACH NOSTRIL EVERY DAY   HumaLOG KwikPen 100 UNIT/ML KwikPen Generic drug: insulin lispro Inject into the skin. Inject 6 to 10 units TID per sliding scale as directed.   sildenafil 20 MG tablet Commonly known as: REVATIO TAKE 2-5 TABLETS AS NEEDED PRIOR TO SEXUAL ACTIVITY   valACYclovir 500 MG tablet Commonly known as: VALTREX TAKE 4 TABLETS BY MOUTH TWICE DAILY AS DIRECTED AND AS NEEDED   Vitamin D3 125 MCG (5000 UT) Caps Take 1 capsule by mouth daily. Takes 6 days a week   warfarin 2.5 MG tablet Commonly  known as: Coumadin Take as directed by the anticoagulation clinic. If you are unsure how to take this medication, talk to your nurse or doctor. Original instructions: Take 1 tablet (2.5 mg total) by mouth daily. Take 1 1/2 tablets on Mondays and Fridays and 1 tablet all other days of the week.        Objective:   BP 131/83   Pulse 80   Temp (!) 97.3 F (36.3 C) (Temporal)   Ht '6\' 1"'  (1.854 m)   Wt 156 lb 3.2 oz (70.9 kg)   SpO2 100%   BMI 20.61 kg/m   Wt Readings from Last 3 Encounters:  01/12/19 156 lb 3.2 oz (70.9 kg)  01/04/19 161 lb  (73 kg)  09/20/18 161 lb (73 kg)    Physical Exam Vitals signs and nursing note reviewed.  Constitutional:      General: He is not in acute distress.    Appearance: He is well-developed. He is not diaphoretic.  Eyes:     General: No scleral icterus.    Conjunctiva/sclera: Conjunctivae normal.  Neck:     Musculoskeletal: Neck supple.     Thyroid: No thyromegaly.  Cardiovascular:     Rate and Rhythm: Normal rate. Rhythm irregular.     Heart sounds: Normal heart sounds. No murmur.  Pulmonary:     Effort: Pulmonary effort is normal. No respiratory distress.     Breath sounds: Normal breath sounds. No wheezing.  Musculoskeletal: Normal range of motion.  Lymphadenopathy:     Cervical: No cervical adenopathy.  Skin:    General: Skin is warm and dry.     Findings: No rash.  Neurological:     Mental Status: He is alert and oriented to person, place, and time.     Coordination: Coordination normal.  Psychiatric:        Behavior: Behavior normal.     Description   Continue taking warfarin the same way:  1 tablet every day except for Mondays and Fridays take 1 1/2 tablets.   INR today 2.5 (goal is 2-3)  Perfect reading today  Return in 6 to 8 weeks      Assessment & Plan:   Problem List Items Addressed This Visit      Cardiovascular and Mediastinum   Benign essential HTN   Relevant Medications   atorvastatin (LIPITOR) 20 MG tablet   Other Relevant Orders   CMP14+EGFR   Chronic atrial fibrillation (HCC)   Relevant Medications   atorvastatin (LIPITOR) 20 MG tablet     Endocrine   Type 2 diabetes mellitus treated with insulin (HCC)   Relevant Medications   atorvastatin (LIPITOR) 20 MG tablet   Other Relevant Orders   CBC with Differential/Platelet   CMP14+EGFR   Lipid panel   Microalbumin / creatinine urine ratio     Hematopoietic and Hemostatic   Primary hypercoagulable state ([place]) [D68.59]     Other   Hyperlipidemia   Relevant Medications   atorvastatin  (LIPITOR) 20 MG tablet   Other Relevant Orders   Lipid panel   Long term (current) use of anticoagulants   History of DVT of lower extremity    Other Visit Diagnoses    Atrial fibrillation, unspecified type ([place])    -  Primary   Relevant Medications   atorvastatin (LIPITOR) 20 MG tablet   Other Relevant Orders   inr FINGERSTICK   CBC with Differential/Platelet   CMP14+EGFR   Lipid panel   Microalbumin / creatinine urine ratio  Patient sees endocrinology to get his A1c.  We will check the rest of the labs and try to get a urine from, if we cannot then we will get one from endocrinology.  Continue current dose of Coumadin Follow up plan: Return if symptoms worsen or fail to improve, for Return in 6 to 8 weeks for Coumadin recheck.  Counseling provided for all of the vaccine components Orders Placed This Encounter  Procedures  . inr FINGERSTICK  . CBC with Differential/Platelet  . CMP14+EGFR  . Lipid panel  . Microalbumin / creatinine urine ratio    Caryl Pina, MD [facility] 01/12/2019, 9:06 AM

## 2019-01-13 LAB — CMP14+EGFR
ALT: 22 IU/L (ref 0–44)
AST: 30 IU/L (ref 0–40)
Albumin/Globulin Ratio: 2.3 — ABNORMAL HIGH (ref 1.2–2.2)
Albumin: 4.6 g/dL (ref 3.8–4.8)
Alkaline Phosphatase: 63 IU/L (ref 39–117)
BUN/Creatinine Ratio: 24 (ref 10–24)
BUN: 23 mg/dL (ref 8–27)
Bilirubin Total: 0.5 mg/dL (ref 0.0–1.2)
CO2: 25 mmol/L (ref 20–29)
Calcium: 9.3 mg/dL (ref 8.6–10.2)
Chloride: 102 mmol/L (ref 96–106)
Creatinine, Ser: 0.95 mg/dL (ref 0.76–1.27)
GFR calc Af Amer: 95 mL/min/{1.73_m2} (ref >59)
GFR calc non Af Amer: 82 mL/min/{1.73_m2} (ref >59)
Globulin, Total: 2 g/dL (ref 1.5–4.5)
Glucose: 164 mg/dL — ABNORMAL HIGH (ref 65–99)
Potassium: 4.3 mmol/L (ref 3.5–5.2)
Sodium: 140 mmol/L (ref 134–144)
Total Protein: 6.6 g/dL (ref 6.0–8.5)

## 2019-01-13 LAB — CBC WITH DIFFERENTIAL/PLATELET
Basophils Absolute: 0 10*3/uL (ref 0.0–0.2)
Basos: 1 %
EOS (ABSOLUTE): 0.1 10*3/uL (ref 0.0–0.4)
Eos: 1 %
Hematocrit: 43.7 % (ref 37.5–51.0)
Hemoglobin: 14.6 g/dL (ref 13.0–17.7)
Immature Grans (Abs): 0 10*3/uL (ref 0.0–0.1)
Immature Granulocytes: 0 %
Lymphocytes Absolute: 1.4 10*3/uL (ref 0.7–3.1)
Lymphs: 30 %
MCH: 31.7 pg (ref 26.6–33.0)
MCHC: 33.4 g/dL (ref 31.5–35.7)
MCV: 95 fL (ref 79–97)
Monocytes Absolute: 0.3 10*3/uL (ref 0.1–0.9)
Monocytes: 6 %
Neutrophils Absolute: 2.9 10*3/uL (ref 1.4–7.0)
Neutrophils: 62 %
Platelets: 169 10*3/uL (ref 150–450)
RBC: 4.6 x10E6/uL (ref 4.14–5.80)
RDW: 12.5 % (ref 11.6–15.4)
WBC: 4.6 10*3/uL (ref 3.4–10.8)

## 2019-01-13 LAB — LIPID PANEL
Chol/HDL Ratio: 2.2 ratio (ref 0.0–5.0)
Cholesterol, Total: 142 mg/dL (ref 100–199)
HDL: 64 mg/dL (ref >39)
LDL Chol Calc (NIH): 67 mg/dL (ref 0–99)
Triglycerides: 51 mg/dL (ref 0–149)
VLDL Cholesterol Cal: 11 mg/dL (ref 5–40)

## 2019-01-14 ENCOUNTER — Telehealth: Payer: Self-pay | Admitting: Family Medicine

## 2019-01-14 NOTE — Telephone Encounter (Signed)
Pt says he saw Dr Dettinger yesterday and was given a sample of of diabetic medication and he believes he left it here. He can't find it. Wants call back from nurse.

## 2019-01-14 NOTE — Telephone Encounter (Signed)
Pt found medication at home, will close encounter.

## 2019-01-17 ENCOUNTER — Telehealth: Payer: Self-pay | Admitting: Family Medicine

## 2019-01-17 ENCOUNTER — Encounter: Payer: Self-pay | Admitting: Family Medicine

## 2019-01-17 NOTE — Telephone Encounter (Signed)
Patient called requesting to speak with nurse to get his lab results. Would like to be called around 4PM tomorrow afternoon.

## 2019-02-11 ENCOUNTER — Other Ambulatory Visit: Payer: Self-pay

## 2019-02-11 ENCOUNTER — Other Ambulatory Visit (HOSPITAL_COMMUNITY)
Admission: RE | Admit: 2019-02-11 | Discharge: 2019-02-11 | Disposition: A | Discharge status: Home / Self Care | Payer: Medicare PPO | Source: Ambulatory Visit | Attending: Primary Care | Admitting: Primary Care

## 2019-02-11 DIAGNOSIS — Z20822 Contact with and (suspected) exposure to covid-19: Secondary | ICD-10-CM | POA: Insufficient documentation

## 2019-02-11 DIAGNOSIS — Z01812 Encounter for preprocedural laboratory examination: Secondary | ICD-10-CM | POA: Insufficient documentation

## 2019-02-11 LAB — SARS CORONAVIRUS 2 (TAT 6-24 HRS): SARS Coronavirus 2: NEGATIVE

## 2019-02-15 ENCOUNTER — Other Ambulatory Visit: Payer: Self-pay | Admitting: Pulmonary Disease

## 2019-02-15 DIAGNOSIS — Z77090 Contact with and (suspected) exposure to asbestos: Secondary | ICD-10-CM

## 2019-02-16 ENCOUNTER — Ambulatory Visit (INDEPENDENT_AMBULATORY_CARE_PROVIDER_SITE_OTHER): Payer: Worker's Compensation | Admitting: Primary Care

## 2019-02-16 ENCOUNTER — Encounter: Payer: Self-pay | Admitting: Primary Care

## 2019-02-16 ENCOUNTER — Telehealth: Payer: Self-pay

## 2019-02-16 ENCOUNTER — Other Ambulatory Visit: Payer: Self-pay

## 2019-02-16 ENCOUNTER — Ambulatory Visit (INDEPENDENT_AMBULATORY_CARE_PROVIDER_SITE_OTHER): Payer: Worker's Compensation | Admitting: Pulmonary Disease

## 2019-02-16 ENCOUNTER — Ambulatory Visit (INDEPENDENT_AMBULATORY_CARE_PROVIDER_SITE_OTHER): Payer: Medicare PPO

## 2019-02-16 VITALS — BP 116/68 | HR 89 | Temp 97.3°F | Ht 73.0 in | Wt 157.0 lb

## 2019-02-16 DIAGNOSIS — Z77090 Contact with and (suspected) exposure to asbestos: Secondary | ICD-10-CM

## 2019-02-16 LAB — PULMONARY FUNCTION TEST
DL/VA % pred: 99 %
DL/VA: 4.05 ml/min/mmHg/L
DLCO unc % pred: 106 %
DLCO unc: 29.02 ml/min/mmHg
FEF 25-75 Post: 4.29 L/sec
FEF 25-75 Pre: 4.13 L/sec
FEF2575-%Change-Post: 3 %
FEF2575-%Pred-Post: 157 %
FEF2575-%Pred-Pre: 151 %
FEV1-%Change-Post: 0 %
FEV1-%Pred-Post: 114 %
FEV1-%Pred-Pre: 113 %
FEV1-Post: 3.99 L
FEV1-Pre: 3.98 L
FEV1FVC-%Change-Post: -1 %
FEV1FVC-%Pred-Pre: 110 %
FEV6-%Change-Post: 4 %
FEV6-%Pred-Post: 110 %
FEV6-%Pred-Pre: 105 %
FEV6-Post: 4.94 L
FEV6-Pre: 4.72 L
FEV6FVC-%Change-Post: 2 %
FEV6FVC-%Pred-Post: 105 %
FEV6FVC-%Pred-Pre: 103 %
FVC-%Change-Post: 1 %
FVC-%Pred-Post: 104 %
FVC-%Pred-Pre: 102 %
FVC-Post: 4.94 L
FVC-Pre: 4.84 L
Post FEV1/FVC ratio: 81 %
Post FEV6/FVC ratio: 100 %
Pre FEV1/FVC ratio: 82 %
Pre FEV6/FVC Ratio: 98 %

## 2019-02-16 NOTE — Progress Notes (Signed)
PFT done today. 

## 2019-02-16 NOTE — Patient Instructions (Addendum)
Recommendations: Annual visit with CXR and pulmonary function testing  Orders: Needs chest x-ray today re: asbestos exposure PFTs in 1 year  Follow-up: Follow-up in 1 year with Dr. Elsworth Soho
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
## 2019-02-17 NOTE — Progress Notes (Signed)
Please let patient know CXR was normal, no acute cardiopulmonary abnormality.

## 2019-02-24 ENCOUNTER — Other Ambulatory Visit: Payer: Self-pay

## 2019-02-24 ENCOUNTER — Ambulatory Visit: Payer: Medicare PPO | Attending: Internal Medicine

## 2019-02-24 DIAGNOSIS — Z20822 Contact with and (suspected) exposure to covid-19: Secondary | ICD-10-CM

## 2019-02-25 ENCOUNTER — Encounter: Payer: Self-pay | Admitting: Primary Care

## 2019-02-25 LAB — NOVEL CORONAVIRUS, NAA: SARS-CoV-2, NAA: NOT DETECTED

## 2019-02-25 NOTE — Assessment & Plan Note (Signed)
-   Stable; no respiratory symptoms - PFTs 2021 normal, DLCO 29.02 (106%) - CXR normal  - Needs annual follow-up

## 2019-04-08 ENCOUNTER — Telehealth: Payer: Self-pay | Admitting: Family Medicine

## 2019-04-08 NOTE — Telephone Encounter (Signed)
Humalog Kwikpen 100unit/ml samples ready for pick up. Patient aware

## 2019-04-14 DIAGNOSIS — Z0289 Encounter for other administrative examinations: Secondary | ICD-10-CM

## 2019-04-18 ENCOUNTER — Other Ambulatory Visit: Payer: Self-pay | Admitting: Family Medicine

## 2019-04-26 DIAGNOSIS — M19049 Primary osteoarthritis, unspecified hand: Secondary | ICD-10-CM | POA: Insufficient documentation

## 2019-04-26 DIAGNOSIS — M79644 Pain in right finger(s): Secondary | ICD-10-CM | POA: Insufficient documentation

## 2019-04-29 ENCOUNTER — Telehealth: Payer: Self-pay | Admitting: Family Medicine

## 2019-04-29 NOTE — Telephone Encounter (Signed)
Patient does need a pro time ASAP but he needs an appointment for it, if there is any openings on Monday we can slip him in a week and watch for cancellations but he does need an appointment for it

## 2019-04-29 NOTE — Telephone Encounter (Signed)
No opening left detailed message for patient to call back and make an appointment when there is an available one per Dr. Warrick Parisian

## 2019-05-09 ENCOUNTER — Encounter: Payer: Self-pay | Admitting: Family Medicine

## 2019-05-09 ENCOUNTER — Other Ambulatory Visit: Payer: Self-pay

## 2019-05-09 ENCOUNTER — Ambulatory Visit (INDEPENDENT_AMBULATORY_CARE_PROVIDER_SITE_OTHER): Payer: Medicare PPO | Admitting: Family Medicine

## 2019-05-09 VITALS — BP 122/79 | HR 84 | Temp 99.3°F | Ht 73.0 in | Wt 160.5 lb

## 2019-05-09 DIAGNOSIS — Z7901 Long term (current) use of anticoagulants: Secondary | ICD-10-CM

## 2019-05-09 DIAGNOSIS — D6859 Other primary thrombophilia: Secondary | ICD-10-CM | POA: Diagnosis not present

## 2019-05-09 DIAGNOSIS — I482 Chronic atrial fibrillation, unspecified: Secondary | ICD-10-CM

## 2019-05-09 DIAGNOSIS — Z86718 Personal history of other venous thrombosis and embolism: Secondary | ICD-10-CM | POA: Diagnosis not present

## 2019-05-09 LAB — COAGUCHEK XS/INR WAIVED
INR: 3.2 — ABNORMAL HIGH (ref 0.9–1.1)
Prothrombin Time: 38.8 s

## 2019-05-09 NOTE — Progress Notes (Signed)
   BP 122/79   Pulse 84   Temp 99.3 F (37.4 C)   Ht 6\' 1"  (1.854 m)   Wt 160 lb 8 oz (72.8 kg)   SpO2 98%   BMI 21.18 kg/m    Subjective:   [name]: Christopher Avery, male    DOB: 22-Nov-1950, 69 y.o.   MRN: [id number]  HPI: Christopher Avery is a 69 y.o. male presenting on 05/09/2019 for Atrial Fibrillation   HPI Coumadin recheck Target goal: 2.0-3.0 Reason on anticoagulation: Chronic A. fib Patient denies any bruising or bleeding or chest pain or palpitations   Relevant past medical, surgical, family and social history reviewed and updated as indicated. Interim medical history since our last visit reviewed. Allergies and medications reviewed and updated.  Review of Systems  Constitutional: Negative for chills and fever.  Eyes: Negative for visual disturbance.  Respiratory: Negative for shortness of breath and wheezing.   Cardiovascular: Negative for chest pain and leg swelling.  Skin: Negative for rash.  Neurological: Negative for dizziness, weakness and light-headedness.  All other systems reviewed and are negative.   Per HPI unless specifically indicated above    Objective:   BP 122/79   Pulse 84   Temp 99.3 F (37.4 C)   Ht 6\' 1"  (1.854 m)   Wt 160 lb 8 oz (72.8 kg)   SpO2 98%   BMI 21.18 kg/m   Wt Readings from Last 3 Encounters:  05/09/19 160 lb 8 oz (72.8 kg)  02/16/19 157 lb (71.2 kg)  01/12/19 156 lb 3.2 oz (70.9 kg)    Physical Exam Vitals and nursing note reviewed.  Constitutional:      General: He is not in acute distress.    Appearance: He is well-developed. He is not diaphoretic.  Eyes:     General: No scleral icterus.    Conjunctiva/sclera: Conjunctivae normal.  Neck:     Thyroid: No thyromegaly.  Neurological:     Mental Status: He is alert and oriented to person, place, and time.     Coordination: Coordination normal.  Psychiatric:        Behavior: Behavior normal.       Assessment & Plan:   Problem List Items Addressed  This Visit      Cardiovascular and Mediastinum   Chronic atrial fibrillation ([place])     Hematopoietic and Hemostatic   Primary hypercoagulable state ([place]) [D68.59]     Other   Long term (current) use of anticoagulants - Primary   Relevant Orders   CoaguChek XS/INR Waived   History of DVT of lower extremity      Description   Decrease dose and take 1 tablet every day except for  Fridays take 1 1/2 tablets.   INR today 3.2 (goal is 2-3)   Return in 6 to 8 weeks      Follow up plan: Return if symptoms worsen or fail to improve, for 4-6 weeks.  Counseling provided for all of the vaccine components Orders Placed This Encounter  Procedures  . CoaguChek XS/INR [place], MD [facility] 05/09/2019, 3:22 PM

## 2019-05-18 ENCOUNTER — Telehealth: Payer: Self-pay | Admitting: Family Medicine

## 2019-05-18 NOTE — Telephone Encounter (Signed)
Humalog sample ready for pick up

## 2019-06-12 ENCOUNTER — Other Ambulatory Visit: Payer: Self-pay | Admitting: Cardiovascular Disease

## 2019-07-11 DIAGNOSIS — Z85820 Personal history of malignant melanoma of skin: Secondary | ICD-10-CM | POA: Diagnosis not present

## 2019-07-11 DIAGNOSIS — Z1283 Encounter for screening for malignant neoplasm of skin: Secondary | ICD-10-CM | POA: Diagnosis not present

## 2019-07-11 DIAGNOSIS — D225 Melanocytic nevi of trunk: Secondary | ICD-10-CM | POA: Diagnosis not present

## 2019-07-11 DIAGNOSIS — X32XXXD Exposure to sunlight, subsequent encounter: Secondary | ICD-10-CM | POA: Diagnosis not present

## 2019-07-11 DIAGNOSIS — Z08 Encounter for follow-up examination after completed treatment for malignant neoplasm: Secondary | ICD-10-CM | POA: Diagnosis not present

## 2019-07-11 DIAGNOSIS — L570 Actinic keratosis: Secondary | ICD-10-CM | POA: Diagnosis not present

## 2019-07-11 DIAGNOSIS — D485 Neoplasm of uncertain behavior of skin: Secondary | ICD-10-CM | POA: Diagnosis not present

## 2019-07-11 DIAGNOSIS — C4441 Basal cell carcinoma of skin of scalp and neck: Secondary | ICD-10-CM | POA: Diagnosis not present

## 2019-07-19 DIAGNOSIS — H11153 Pinguecula, bilateral: Secondary | ICD-10-CM | POA: Diagnosis not present

## 2019-07-19 DIAGNOSIS — E1165 Type 2 diabetes mellitus with hyperglycemia: Secondary | ICD-10-CM | POA: Diagnosis not present

## 2019-07-19 DIAGNOSIS — E119 Type 2 diabetes mellitus without complications: Secondary | ICD-10-CM | POA: Diagnosis not present

## 2019-07-19 DIAGNOSIS — H2513 Age-related nuclear cataract, bilateral: Secondary | ICD-10-CM | POA: Diagnosis not present

## 2019-07-19 DIAGNOSIS — I10 Essential (primary) hypertension: Secondary | ICD-10-CM | POA: Diagnosis not present

## 2019-07-19 DIAGNOSIS — E7800 Pure hypercholesterolemia, unspecified: Secondary | ICD-10-CM | POA: Diagnosis not present

## 2019-07-19 DIAGNOSIS — E162 Hypoglycemia, unspecified: Secondary | ICD-10-CM | POA: Diagnosis not present

## 2019-07-19 DIAGNOSIS — H353132 Nonexudative age-related macular degeneration, bilateral, intermediate dry stage: Secondary | ICD-10-CM | POA: Diagnosis not present

## 2019-07-19 LAB — HM DIABETES EYE EXAM

## 2019-07-26 ENCOUNTER — Encounter: Payer: Self-pay | Admitting: Nurse Practitioner

## 2019-07-26 ENCOUNTER — Ambulatory Visit (INDEPENDENT_AMBULATORY_CARE_PROVIDER_SITE_OTHER): Payer: Medicare PPO | Admitting: Nurse Practitioner

## 2019-07-26 ENCOUNTER — Other Ambulatory Visit: Payer: Self-pay

## 2019-07-26 VITALS — BP 141/87 | HR 73 | Temp 98.0°F | Resp 20 | Ht 73.0 in | Wt 161.0 lb

## 2019-07-26 DIAGNOSIS — R609 Edema, unspecified: Secondary | ICD-10-CM | POA: Diagnosis not present

## 2019-07-26 DIAGNOSIS — R600 Localized edema: Secondary | ICD-10-CM

## 2019-07-26 MED ORDER — FUROSEMIDE 20 MG PO TABS: 20.0000 mg | ORAL_TABLET | Freq: Every day | ORAL | 3 refills | Status: DC

## 2019-07-26 NOTE — Patient Instructions (Signed)
Peripheral Edema  Peripheral edema is swelling that is caused by a buildup of fluid. Peripheral edema most often affects the lower legs, ankles, and feet. It can also develop in the arms, hands, and face. The area of the body that has peripheral edema will look swollen. It may also feel heavy or warm. Your clothes may start to feel tight. Pressing on the area may make a temporary dent in your skin. You may not be able to move your swollen arm or leg as much as usual. There are many causes of peripheral edema. It can happen because of a complication of other conditions such as congestive heart failure, kidney disease, or a problem with your blood circulation. It also can be a side effect of certain medicines or because of an infection. It often happens to women during pregnancy. Sometimes, the cause is not known. Follow these instructions at home: Managing pain, stiffness, and swelling   Raise (elevate) your legs while you are sitting or lying down.  Move around often to prevent stiffness and to lessen swelling.  Do not sit or stand for long periods of time.  Wear support stockings as told by your health care provider. Medicines  Take over-the-counter and prescription medicines only as told by your health care provider.  Your health care provider may prescribe medicine to help your body get rid of excess water (diuretic). General instructions  Pay attention to any changes in your symptoms.  Follow instructions from your health care provider about limiting salt (sodium) in your diet. Sometimes, eating less salt may reduce swelling.  Moisturize skin daily to help prevent skin from cracking and draining.  Keep all follow-up visits as told by your health care provider. This is important. Contact a health care provider if you have:  A fever.  Edema that starts suddenly or is getting worse, especially if you are pregnant or have a medical condition.  Swelling in only one leg.  Increased  swelling, redness, or pain in one or both of your legs.  Drainage or sores at the area where you have edema. Get help right away if you:  Develop shortness of breath, especially when you are lying down.  Have pain in your chest or abdomen.  Feel weak.  Feel faint. Summary  Peripheral edema is swelling that is caused by a buildup of fluid. Peripheral edema most often affects the lower legs, ankles, and feet.  Move around often to prevent stiffness and to lessen swelling. Do not sit or stand for long periods of time.  Pay attention to any changes in your symptoms.  Contact a health care provider if you have edema that starts suddenly or is getting worse, especially if you are pregnant or have a medical condition.  Get help right away if you develop shortness of breath, especially when lying down. This information is not intended to replace advice given to you by your health care provider. Make sure you discuss any questions you have with your health care provider. Document Revised: 10/14/2017 Document Reviewed: 10/14/2017 Elsevier Patient Education  2020 Elsevier Inc.  

## 2019-07-26 NOTE — Progress Notes (Signed)
   Subjective:    [name]: Christopher Avery, male    DOB: 1950/04/28, 69 y.o.   MRN: [id number]   Chief Complaint: Bilateral legs swelling   HPI Patient come sin today c/o swelling of bilateral lower ext. Has been going on for over a week. He says it resolves at night and comes back during the day. The right leg is worse then the left. He denies any SOB or dyspnea. Doe snot check blood pressure at home.    Review of Systems  Constitutional: Negative.   Respiratory: Negative for cough, chest tightness and shortness of breath.   Cardiovascular: Positive for leg swelling. Negative for chest pain and palpitations.  Gastrointestinal: Negative.   Genitourinary: Negative.   Neurological: Negative.   Psychiatric/Behavioral: Negative.   All other systems reviewed and are negative.      Objective:   Physical Exam Vitals and nursing note reviewed.  Constitutional:      Appearance: Normal appearance.  Cardiovascular:     Rate and Rhythm: Normal rate and regular rhythm.     Heart sounds: Normal heart sounds.     Comments: Rope like and fine varicosities of bil lower ext. Pulmonary:     Breath sounds: Normal breath sounds.  Musculoskeletal:     Right lower leg: Edema (!+) present.     Left lower leg: Edema (mild) present.  Skin:    General: Skin is warm.  Neurological:     General: No focal deficit present.     Mental Status: He is alert.  Psychiatric:        Mood and Affect: Mood normal.        Behavior: Behavior normal.    BP (!) 141/87   Pulse 73   Temp 98 F (36.7 C) (Temporal)   Resp 20   Ht _0  (1.854 m)   Wt 161 lb (73 kg)   SpO2 98%   BMI 21.24 kg/m         Assessment & Plan:  Christopher Avery in today with chief complaint of Bilateral legs swelling   1. Peripheral edema Wear compression socks when up working elevate legs when sitting - CMP14+EGFR - Brain natriuretic peptide - furosemide (LASIX) 20 MG tablet; Take 1 tablet (20 mg total) by mouth  daily.  Dispense: 30 tablet; Refill: 3    The above assessment and management plan was discussed with the patient. The patient verbalized understanding of and has agreed to the management plan. Patient is aware to call the clinic if symptoms persist or worsen. Patient is aware when to return to the clinic for a follow-up visit. Patient educated on when it is appropriate to go to the emergency department.   Mary-Margaret Hassell Done, FNP

## 2019-07-27 LAB — CMP14+EGFR
ALT: 53 IU/L — ABNORMAL HIGH (ref 0–44)
AST: 39 IU/L (ref 0–40)
Albumin/Globulin Ratio: 2.1 (ref 1.2–2.2)
Albumin: 4.2 g/dL (ref 3.8–4.8)
Alkaline Phosphatase: 70 IU/L (ref 48–121)
BUN/Creatinine Ratio: 24 (ref 10–24)
BUN: 22 mg/dL (ref 8–27)
Bilirubin Total: 0.6 mg/dL (ref 0.0–1.2)
CO2: 25 mmol/L (ref 20–29)
Calcium: 9.1 mg/dL (ref 8.6–10.2)
Chloride: 102 mmol/L (ref 96–106)
Creatinine, Ser: 0.9 mg/dL (ref 0.76–1.27)
GFR calc Af Amer: 101 mL/min/{1.73_m2} (ref >59)
GFR calc non Af Amer: 87 mL/min/{1.73_m2} (ref >59)
Globulin, Total: 2 g/dL (ref 1.5–4.5)
Glucose: 289 mg/dL — ABNORMAL HIGH (ref 65–99)
Potassium: 4.4 mmol/L (ref 3.5–5.2)
Sodium: 138 mmol/L (ref 134–144)
Total Protein: 6.2 g/dL (ref 6.0–8.5)

## 2019-07-27 LAB — BRAIN NATRIURETIC PEPTIDE: BNP: 249.1 pg/mL — ABNORMAL HIGH (ref 0.0–100.0)

## 2019-08-01 DIAGNOSIS — I4891 Unspecified atrial fibrillation: Secondary | ICD-10-CM | POA: Diagnosis not present

## 2019-08-01 DIAGNOSIS — Z7901 Long term (current) use of anticoagulants: Secondary | ICD-10-CM | POA: Diagnosis not present

## 2019-08-01 DIAGNOSIS — E785 Hyperlipidemia, unspecified: Secondary | ICD-10-CM | POA: Diagnosis not present

## 2019-08-01 DIAGNOSIS — B009 Herpesviral infection, unspecified: Secondary | ICD-10-CM | POA: Diagnosis not present

## 2019-08-01 DIAGNOSIS — Z8249 Family history of ischemic heart disease and other diseases of the circulatory system: Secondary | ICD-10-CM | POA: Diagnosis not present

## 2019-08-01 DIAGNOSIS — I83893 Varicose veins of bilateral lower extremities with other complications: Secondary | ICD-10-CM | POA: Diagnosis not present

## 2019-08-01 DIAGNOSIS — E1165 Type 2 diabetes mellitus with hyperglycemia: Secondary | ICD-10-CM | POA: Diagnosis not present

## 2019-08-01 DIAGNOSIS — Z794 Long term (current) use of insulin: Secondary | ICD-10-CM | POA: Diagnosis not present

## 2019-08-01 DIAGNOSIS — Z809 Family history of malignant neoplasm, unspecified: Secondary | ICD-10-CM | POA: Diagnosis not present

## 2019-08-01 DIAGNOSIS — I8312 Varicose veins of left lower extremity with inflammation: Secondary | ICD-10-CM | POA: Diagnosis not present

## 2019-08-01 DIAGNOSIS — I8311 Varicose veins of right lower extremity with inflammation: Secondary | ICD-10-CM | POA: Diagnosis not present

## 2019-08-01 DIAGNOSIS — D6869 Other thrombophilia: Secondary | ICD-10-CM | POA: Diagnosis not present

## 2019-08-11 ENCOUNTER — Other Ambulatory Visit: Payer: Self-pay | Admitting: Family Medicine

## 2019-08-15 ENCOUNTER — Ambulatory Visit: Payer: Medicare PPO | Admitting: Physician Assistant

## 2019-08-15 ENCOUNTER — Encounter: Payer: Self-pay | Admitting: Family Medicine

## 2019-08-15 ENCOUNTER — Other Ambulatory Visit: Payer: Self-pay | Admitting: Cardiovascular Disease

## 2019-08-15 NOTE — Telephone Encounter (Signed)
*  STAT* If patient is at the pharmacy, call can be transferred to refill team.   1. Which medications need to be refilled? (please list name of each medication and dose if known)? diltiazem (CARDIZEM CD) 240 MG 24 hr capsule  2. Which pharmacy/location (including street and city if local pharmacy) is medication to be sent to? [id number] - [name], [place]  3. Do they need a 30 day or 90 day supply? 90 day

## 2019-08-16 MED ORDER — DILTIAZEM HCL ER COATED BEADS 240 MG PO CP24: ORAL_CAPSULE | ORAL | 0 refills | Status: DC

## 2019-08-22 DIAGNOSIS — I8311 Varicose veins of right lower extremity with inflammation: Secondary | ICD-10-CM | POA: Diagnosis not present

## 2019-08-22 DIAGNOSIS — I8312 Varicose veins of left lower extremity with inflammation: Secondary | ICD-10-CM | POA: Diagnosis not present

## 2019-08-22 DIAGNOSIS — I83813 Varicose veins of bilateral lower extremities with pain: Secondary | ICD-10-CM | POA: Diagnosis not present

## 2019-08-22 DIAGNOSIS — E1165 Type 2 diabetes mellitus with hyperglycemia: Secondary | ICD-10-CM | POA: Diagnosis not present

## 2019-08-23 ENCOUNTER — Encounter: Payer: Self-pay | Admitting: Family Medicine

## 2019-08-23 ENCOUNTER — Other Ambulatory Visit: Payer: Self-pay

## 2019-08-23 ENCOUNTER — Ambulatory Visit (INDEPENDENT_AMBULATORY_CARE_PROVIDER_SITE_OTHER): Payer: Medicare PPO | Admitting: Family Medicine

## 2019-08-23 VITALS — BP 137/80 | HR 79 | Temp 98.0°F | Ht 73.0 in | Wt 157.0 lb

## 2019-08-23 DIAGNOSIS — Z794 Long term (current) use of insulin: Secondary | ICD-10-CM | POA: Diagnosis not present

## 2019-08-23 DIAGNOSIS — E119 Type 2 diabetes mellitus without complications: Secondary | ICD-10-CM | POA: Diagnosis not present

## 2019-08-23 DIAGNOSIS — I482 Chronic atrial fibrillation, unspecified: Secondary | ICD-10-CM | POA: Diagnosis not present

## 2019-08-23 DIAGNOSIS — D6859 Other primary thrombophilia: Secondary | ICD-10-CM

## 2019-08-23 DIAGNOSIS — Z86718 Personal history of other venous thrombosis and embolism: Secondary | ICD-10-CM | POA: Diagnosis not present

## 2019-08-23 LAB — COAGUCHEK XS/INR WAIVED
INR: 2.2 — ABNORMAL HIGH (ref 0.9–1.1)
Prothrombin Time: 26.8 s

## 2019-08-23 NOTE — Progress Notes (Signed)
BP 137/80    Pulse 79    Temp 98 F (36.7 C)    Ht 6\' 1"  (1.854 m)    Wt 157 lb (71.2 kg)    SpO2 98%    BMI 20.71 kg/m    Subjective:   [name]: Christopher Avery, male    DOB: 25-Jan-1951, 69 y.o.   MRN: [id number]  HPI: Christopher Avery is a 69 y.o. male presenting on 08/23/2019 for Medical Management of Chronic Issues and Atrial Fibrillation   HPI Coumadin recheck Target goal: 2.0-3.0 Reason on anticoagulation: Chronic A. fib Patient denies any bruising or bleeding or chest pain or palpitations   Relevant past medical, surgical, family and social history reviewed and updated as indicated. Interim medical history since our last visit reviewed. Allergies and medications reviewed and updated.  Review of Systems  Constitutional: Negative for chills and fever.  Eyes: Negative for visual disturbance.  Respiratory: Negative for shortness of breath and wheezing.   Cardiovascular: Negative for chest pain and leg swelling.  Musculoskeletal: Negative for back pain and gait problem.  Skin: Negative for rash.  Neurological: Negative for dizziness, weakness and light-headedness.  All other systems reviewed and are negative.   Per HPI unless specifically indicated above   Allergies as of 08/23/2019      Reactions   Januvia [sitagliptin] Hives      Medication List       Accurate as of August 23, 2019  9:17 AM. If you have any questions, ask your nurse or doctor.        atorvastatin 20 MG tablet Commonly known as: LIPITOR Take 1 tablet (20 mg total) by mouth daily.   Basaglar KwikPen 100 UNIT/ML Inject 17 Units into the skin every evening.   BD Pen Needle Nano 2nd Gen 32G X 4 MM Misc Generic drug: Insulin Pen Needle USE WITH INSULIN 4 TIMES A DAY DX E11.9   co-enzyme Q-10 30 MG capsule Take 100 mg by mouth daily.   diltiazem 240 MG 24 hr capsule Commonly known as: CARDIZEM CD TAKE 1 CAPSULE DAILY   fluticasone 50 MCG/ACT nasal spray Commonly known as: FLONASE SPRAY  2 SPRAYS INTO EACH NOSTRIL EVERY DAY   furosemide 20 MG tablet Commonly known as: LASIX Take 1 tablet (20 mg total) by mouth daily.   HumaLOG KwikPen 100 UNIT/ML KwikPen Generic drug: insulin lispro Inject into the skin. Inject 6 to 10 units TID per sliding scale as directed.   sildenafil 20 MG tablet Commonly known as: REVATIO TAKE 2-5 TABLETS AS NEEDED PRIOR TO SEXUAL ACTIVITY   valACYclovir 500 MG tablet Commonly known as: VALTREX TAKE 4 TABLETS BY MOUTH TWICE DAILY AS DIRECTED AND AS NEEDED   Vitamin D3 125 MCG (5000 UT) Caps Take 1 capsule by mouth daily. Takes 6 days a week   warfarin 2.5 MG tablet Commonly known as: Coumadin Take as directed by the anticoagulation clinic. If you are unsure how to take this medication, talk to your nurse or doctor. Original instructions: Take 1 tablet (2.5 mg total) by mouth daily. Take 1 1/2 tablets on Mondays and Fridays and 1 tablet all other days of the week.        Objective:   BP 137/80    Pulse 79    Temp 98 F (36.7 C)    Ht 6\' 1"  (1.854 m)    Wt 157 lb (71.2 kg)    SpO2 98%    BMI 20.71 kg/m  Wt Readings from Last 3 Encounters:  08/23/19 157 lb (71.2 kg)  07/26/19 161 lb (73 kg)  05/09/19 160 lb 8 oz (72.8 kg)    Physical Exam Vitals and nursing note reviewed.  Constitutional:      General: He is not in acute distress.    Appearance: He is well-developed. He is not diaphoretic.  Eyes:     General: No scleral icterus.    Conjunctiva/sclera: Conjunctivae normal.  Neck:     Thyroid: No thyromegaly.  Cardiovascular:     Rate and Rhythm: Normal rate. Rhythm irregular.     Heart sounds: Normal heart sounds. No murmur heard.   Pulmonary:     Effort: Pulmonary effort is normal. No respiratory distress.     Breath sounds: Normal breath sounds. No wheezing.  Musculoskeletal:        General: Normal range of motion.     Cervical back: Neck supple.  Lymphadenopathy:     Cervical: No cervical adenopathy.  Skin:     General: Skin is warm and dry.     Findings: No rash.  Neurological:     Mental Status: He is alert and oriented to person, place, and time.     Coordination: Coordination normal.  Psychiatric:        Behavior: Behavior normal.     Results for orders placed or performed in visit on 08/03/19  HM DIABETES EYE EXAM  Result Value Ref Range   HM Diabetic Eye Exam No Retinopathy No Retinopathy    Assessment & Plan:   Problem List Items Addressed This Visit      Cardiovascular and Mediastinum   Chronic atrial fibrillation ([place])     Endocrine   Type 2 diabetes mellitus treated with insulin ([place]) - Primary     Hematopoietic and Hemostatic   Primary hypercoagulable state ([place]) [D68.59]     Other   History of DVT of lower extremity       Patient sees an endocrinologist Dr. Michiel Sites for diabetes and last A1c was just recently at 7.0, will request those records. Description   continue dose and take 1 tablet every day except for  Fridays take 1 1/2 tablets.   INR today 2.2 (goal is 2-3)   Return in 6 to 8 weeks     Follow up plan: Return if symptoms worsen or fail to improve, for 6 to 8-week INR recheck.  Counseling provided for all of the vaccine components No orders of the defined types were placed in this encounter.   Caryl Pina, MD [facility] 08/23/2019, 9:17 AM

## 2019-08-23 NOTE — Addendum Note (Signed)
Addended by: Alphonzo Dublin on: 08/23/2019 11:22 AM   Modules accepted: Orders

## 2019-08-23 NOTE — Addendum Note (Signed)
Addended by: Alphonzo Dublin on: 08/23/2019 10:42 AM   Modules accepted: Orders

## 2019-08-25 DIAGNOSIS — C44311 Basal cell carcinoma of skin of nose: Secondary | ICD-10-CM | POA: Diagnosis not present

## 2019-08-25 DIAGNOSIS — Z85828 Personal history of other malignant neoplasm of skin: Secondary | ICD-10-CM | POA: Diagnosis not present

## 2019-08-25 DIAGNOSIS — L570 Actinic keratosis: Secondary | ICD-10-CM | POA: Diagnosis not present

## 2019-08-25 DIAGNOSIS — X32XXXD Exposure to sunlight, subsequent encounter: Secondary | ICD-10-CM | POA: Diagnosis not present

## 2019-08-25 DIAGNOSIS — Z08 Encounter for follow-up examination after completed treatment for malignant neoplasm: Secondary | ICD-10-CM | POA: Diagnosis not present

## 2019-09-07 ENCOUNTER — Ambulatory Visit: Payer: Medicare PPO | Admitting: Cardiovascular Disease

## 2019-09-07 ENCOUNTER — Encounter: Payer: Self-pay | Admitting: Cardiovascular Disease

## 2019-09-07 ENCOUNTER — Other Ambulatory Visit: Payer: Self-pay

## 2019-09-07 VITALS — BP 132/74 | HR 62 | Ht 72.0 in | Wt 156.4 lb

## 2019-09-07 DIAGNOSIS — I482 Chronic atrial fibrillation, unspecified: Secondary | ICD-10-CM | POA: Diagnosis not present

## 2019-09-07 DIAGNOSIS — I10 Essential (primary) hypertension: Secondary | ICD-10-CM | POA: Diagnosis not present

## 2019-09-07 DIAGNOSIS — R0989 Other specified symptoms and signs involving the circulatory and respiratory systems: Secondary | ICD-10-CM | POA: Diagnosis not present

## 2019-09-07 NOTE — Patient Instructions (Signed)
Medication Instructions:  Your physician recommends that you continue on your current medications as directed. Please refer to the Current Medication list given to you today.  *If you need a refill on your cardiac medications before your next appointment, please call your pharmacy*   Lab Work: NONE If you have labs (blood work) drawn today and your tests are completely normal, you will receive your results only by: [name] MyChart Message (if you have MyChart) OR . A paper copy in the mail If you have any lab test that is abnormal or we need to change your treatment, we will call you to review the results.   Testing/Procedures: CAROTID DOPPLER   Follow-Up: At [facility], you and your health needs are our priority.  As part of our continuing mission to provide you with exceptional heart care, we have created designated Provider Care Teams.  These Care Teams include your primary Cardiologist (physician) and Advanced Practice Providers (APPs -  Physician Assistants and Nurse Practitioners) who all work together to provide you with the care you need, when you need it.  We recommend signing up for the patient portal called "MyChart".  Sign up information is provided on this After Visit Summary.  MyChart is used to connect with patients for Virtual Visits (Telemedicine).  Patients are able to view lab/test results, encounter notes, upcoming appointments, etc.  Non-urgent messages can be sent to your provider as well.   To learn more about what you can do with MyChart, go to [email or web address].    Your next appointment:   12 month(s)  The format for your next appointment:   In Person  Provider:   You may see Dr. Gwenlyn Found or one of the following Advanced Practice Providers on your designated Care Team:    Kerin Ransom, PA-C  [place], [place]  Coletta Memos, [place]    Other Instructions

## 2019-09-07 NOTE — Assessment & Plan Note (Signed)
History of essential hypertension blood pressure measured today 132/74.  He is on diltiazem.

## 2019-09-07 NOTE — Assessment & Plan Note (Signed)
History of hyperlipidemia on statin therapy with lipid profile Pap performed 01/12/2019 revealing total cholesterol of 142, with an LDL of 67 and HDL 42.

## 2019-09-07 NOTE — Assessment & Plan Note (Signed)
History of persistent atrial fibrillation rate controlled on Coumadin anticoagulation with recent INR 2.2.  He does not wish to change to a DOAC.  He is asymptomatic.

## 2019-09-07 NOTE — Progress Notes (Signed)
09/07/2019 Christopher Avery   1950-06-23  [id number]  Primary Physician Dettinger, Christopher Kaufmann, MD Primary Cardiologist: Christopher Harp MD Christopher Avery, Christopher Avery, [place]  HPI:  Christopher Avery is a 69 y.o.  thin and fit-appearing married Caucasian male, father of 79, grandfather of 4 grandchildren, who I saw  03/12/2018.Marland KitchenHis mother-in-law, Christopher Avery, was a patient of mine for a long time and died 06/16/2016.  He is accompanied by his wife Christopher Avery today.Marland KitchenHe has a history of paroxysmal atrial fibrillation, maintaining sinus rhythm on Coumadin anticoagulation  His other problems include hypertension, hyperlipidemia, and family history of heart disease.   Since I saw him a year and a half ago he is done well.  He continues on Coumadin with therapeutic INRs.  He is unaware that he is in A. fib.  He denies chest pain or shortness of breath.   Current Meds  Medication Sig  . amoxicillin (AMOXIL) 500 MG capsule amoxicillin 500 mg capsule  TAKE 4 CAPSULES BY MOUTH 1 HOUR PRIOR TO DENTAL APPOINTMENT  . atorvastatin (LIPITOR) 20 MG tablet Take 1 tablet (20 mg total) by mouth daily.  . BD PEN NEEDLE NANO 2ND GEN 32G X 4 MM MISC USE WITH INSULIN 4 TIMES A DAY DX E11.9  . Cholecalciferol (VITAMIN D3) 125 MCG (5000 UT) CAPS Take 1 capsule by mouth daily. Takes 6 days a week  . co-enzyme Q-10 30 MG capsule Take 100 mg by mouth daily.   [name] diltiazem (CARDIZEM CD) 240 MG 24 hr capsule TAKE 1 CAPSULE DAILY  . fluticasone (FLONASE) 50 MCG/ACT nasal spray SPRAY 2 SPRAYS INTO EACH NOSTRIL EVERY DAY  . furosemide (LASIX) 20 MG tablet Take 1 tablet (20 mg total) by mouth daily.  [name] NOVOLOG FLEXPEN 100 UNIT/ML FlexPen   . sildenafil (REVATIO) 20 MG tablet TAKE 2-5 TABLETS AS NEEDED PRIOR TO SEXUAL ACTIVITY  . valACYclovir (VALTREX) 500 MG tablet TAKE 4 TABLETS BY MOUTH TWICE DAILY AS DIRECTED AND AS NEEDED  . warfarin (COUMADIN) 2.5 MG tablet Take 1 tablet (2.5 mg total) by mouth daily. Take 1 1/2 tablets on Mondays  and Fridays and 1 tablet all other days of the week.     Allergies  Allergen Reactions  . Christopher Avery [Christopher Avery] Hives    Social History   Socioeconomic History  . Marital status: Married    Spouse name: Not on file  . Number of children: 2  . Years of education: 14 years  . Highest education level: Some college, no degree  Occupational History  . Occupation: Retired    Fish farm manager: [employer]  Tobacco Use  . Smoking status: Never Smoker  . Smokeless tobacco: Never Used  Vaping Use  . Vaping Use: Never used  Substance and Sexual Activity  . Alcohol use: Never    Alcohol/week: 0.0 standard drinks  . Drug use: No  . Sexual activity: Yes  Other Topics Concern  . Not on file  Social History Narrative  . Not on file   Social Determinants of Health   Financial Resource Strain:   . Difficulty of Paying Living Expenses:   Food Insecurity:   . Worried About Charity fundraiser in the Last Year:   . Arboriculturist in the Last Year:   Transportation Needs:   . Film/video editor (Medical):   [name] Lack of Transportation (Non-Medical):   Physical Activity:   . Days of Exercise per Week:   . Minutes of Exercise per  Session:   Stress:   . Feeling of Stress :   Social Connections:   . Frequency of Communication with Friends and Family:   . Frequency of Social Gatherings with Friends and Family:   . Attends Religious Services:   . Active Member of Clubs or Organizations:   . Attends Archivist Meetings:   [name] Marital Status:   Intimate Partner Violence:   . Fear of Current or Ex-Partner:   . Emotionally Abused:   [name] Physically Abused:   . Sexually Abused:      Review of Systems: General: negative for chills, fever, night sweats or weight changes.  Cardiovascular: negative for chest pain, dyspnea on exertion, edema, orthopnea, palpitations, paroxysmal nocturnal dyspnea or shortness of breath Dermatological: negative for rash Respiratory: negative for cough or  wheezing Urologic: negative for hematuria Abdominal: negative for nausea, vomiting, diarrhea, bright red blood per rectum, melena, or hematemesis Neurologic: negative for visual changes, syncope, or dizziness All other systems reviewed and are otherwise negative except as noted above.    Blood pressure 132/74, pulse 62, height 6' (1.829 m), weight 156 lb 6.4 oz (70.9 kg).  General appearance: alert and no distress Neck: no adenopathy, no JVD, supple, symmetrical, trachea midline, thyroid not enlarged, symmetric, no tenderness/mass/nodules and High-pitched soft left carotid bruit Lungs: clear to auscultation bilaterally Heart: irregularly irregular rhythm Extremities: extremities normal, atraumatic, no cyanosis or edema Pulses: 2+ and symmetric Skin: Skin color, texture, turgor normal. No rashes or lesions Neurologic: Alert and oriented X 3, normal strength and tone. Normal symmetric reflexes. Normal coordination and gait  EKG atrial fibrillation with ventricular spots of 62 and occasional aberrantly conducted beats.  I personally reviewed this EKG.  ASSESSMENT AND PLAN:   Hyperlipidemia History of hyperlipidemia on statin therapy with lipid profile Pap performed 01/12/2019 revealing total cholesterol of 142, with an LDL of 67 and HDL 42.  Benign essential HTN History of essential hypertension blood pressure measured today 132/74.  He is on diltiazem.  Chronic atrial fibrillation (HCC) History of persistent atrial fibrillation rate controlled on Coumadin anticoagulation with recent INR 2.2.  He does not wish to change to a DOAC.  He is asymptomatic.  Left carotid bruit Newly recognized left carotid bruit on exam today.  Will check carotid Doppler studies.      Christopher Harp MD FACP,FACC,FAHA, [facility] 09/07/2019 9:27 AM

## 2019-09-07 NOTE — Assessment & Plan Note (Signed)
Newly recognized left carotid bruit on exam today.  Will check carotid Doppler studies.

## 2019-09-12 ENCOUNTER — Other Ambulatory Visit: Payer: Self-pay

## 2019-09-12 ENCOUNTER — Ambulatory Visit (HOSPITAL_COMMUNITY)
Admission: RE | Admit: 2019-09-12 | Discharge: 2019-09-12 | Disposition: A | Discharge status: Home / Self Care | Payer: Medicare PPO | Source: Ambulatory Visit | Attending: Cardiology | Admitting: Cardiology

## 2019-09-12 DIAGNOSIS — R0989 Other specified symptoms and signs involving the circulatory and respiratory systems: Secondary | ICD-10-CM

## 2019-09-16 ENCOUNTER — Other Ambulatory Visit: Payer: Self-pay | Admitting: Family Medicine

## 2019-09-16 NOTE — Telephone Encounter (Signed)
Last office visit 08/23/2019 Last refill 04/18/2019, #50, 1 refill

## 2019-09-29 ENCOUNTER — Other Ambulatory Visit: Payer: Self-pay | Admitting: Family Medicine

## 2019-10-18 ENCOUNTER — Other Ambulatory Visit: Payer: Self-pay | Admitting: Nurse Practitioner

## 2019-10-18 DIAGNOSIS — R609 Edema, unspecified: Secondary | ICD-10-CM

## 2019-11-10 DIAGNOSIS — E1165 Type 2 diabetes mellitus with hyperglycemia: Secondary | ICD-10-CM | POA: Diagnosis not present

## 2019-11-19 ENCOUNTER — Other Ambulatory Visit: Payer: Self-pay | Admitting: Cardiovascular Disease

## 2019-11-25 DIAGNOSIS — M7541 Impingement syndrome of right shoulder: Secondary | ICD-10-CM | POA: Diagnosis not present

## 2019-12-27 ENCOUNTER — Other Ambulatory Visit: Payer: Self-pay

## 2019-12-27 ENCOUNTER — Encounter: Payer: Self-pay | Admitting: Nurse Practitioner

## 2019-12-27 ENCOUNTER — Ambulatory Visit (INDEPENDENT_AMBULATORY_CARE_PROVIDER_SITE_OTHER): Payer: Medicare PPO | Admitting: Nurse Practitioner

## 2019-12-27 VITALS — BP 140/85 | HR 92 | Temp 97.7°F | Ht 73.0 in | Wt 155.2 lb

## 2019-12-27 DIAGNOSIS — D6859 Other primary thrombophilia: Secondary | ICD-10-CM | POA: Diagnosis not present

## 2019-12-27 DIAGNOSIS — Z23 Encounter for immunization: Secondary | ICD-10-CM

## 2019-12-27 DIAGNOSIS — I482 Chronic atrial fibrillation, unspecified: Secondary | ICD-10-CM

## 2019-12-27 DIAGNOSIS — Z86718 Personal history of other venous thrombosis and embolism: Secondary | ICD-10-CM

## 2019-12-27 LAB — COAGUCHEK XS/INR WAIVED
INR: 2 — ABNORMAL HIGH (ref 0.9–1.1)
Prothrombin Time: 24.3 s

## 2019-12-27 NOTE — Assessment & Plan Note (Signed)
Chronic arterial fibrillation controlled on 2.5 mg Coumadin.  Patient is therapeutic.  Follow-up 6 to 8 weeks.

## 2019-12-27 NOTE — Patient Instructions (Signed)

## 2019-12-27 NOTE — Progress Notes (Signed)
Subjective:     Indication: atrial fibrillation Bleeding signs/symptoms: None Thromboembolic signs/symptoms: None  Missed Coumadin doses: None Medication changes: no Dietary changes: no Bacterial/viral infection: no Other concerns: no  The following portions of the patient's history were reviewed and updated as appropriate: allergies, current medications, past family history, past medical history, past social history, past surgical history and problem list.  Review of Systems Pertinent items are noted in HPI.   Objective:    INR Today: 2.0 Current dose: 2.5 mg    Assessment:    Therapeutic INR for goal of 2-3   Plan:    1. New dose: no change   2. Next INR: 6-8 weeks

## 2020-01-17 ENCOUNTER — Other Ambulatory Visit: Payer: Self-pay | Admitting: Family Medicine

## 2020-01-18 DIAGNOSIS — E1165 Type 2 diabetes mellitus with hyperglycemia: Secondary | ICD-10-CM | POA: Diagnosis not present

## 2020-01-18 DIAGNOSIS — R634 Abnormal weight loss: Secondary | ICD-10-CM | POA: Diagnosis not present

## 2020-01-18 DIAGNOSIS — E162 Hypoglycemia, unspecified: Secondary | ICD-10-CM | POA: Diagnosis not present

## 2020-01-18 DIAGNOSIS — I10 Essential (primary) hypertension: Secondary | ICD-10-CM | POA: Diagnosis not present

## 2020-01-18 DIAGNOSIS — E7800 Pure hypercholesterolemia, unspecified: Secondary | ICD-10-CM | POA: Diagnosis not present

## 2020-01-30 DIAGNOSIS — E1165 Type 2 diabetes mellitus with hyperglycemia: Secondary | ICD-10-CM | POA: Diagnosis not present

## 2020-02-10 ENCOUNTER — Other Ambulatory Visit: Payer: Self-pay | Admitting: Family Medicine

## 2020-02-13 DIAGNOSIS — M25511 Pain in right shoulder: Secondary | ICD-10-CM | POA: Diagnosis not present

## 2020-02-16 ENCOUNTER — Other Ambulatory Visit: Payer: Self-pay | Admitting: Cardiovascular Disease

## 2020-03-01 DIAGNOSIS — C44612 Basal cell carcinoma of skin of right upper limb, including shoulder: Secondary | ICD-10-CM | POA: Diagnosis not present

## 2020-03-13 DIAGNOSIS — M25511 Pain in right shoulder: Secondary | ICD-10-CM | POA: Diagnosis not present

## 2020-03-20 ENCOUNTER — Encounter: Payer: Self-pay | Admitting: Family Medicine

## 2020-03-20 ENCOUNTER — Telehealth: Payer: Self-pay

## 2020-03-20 DIAGNOSIS — M25511 Pain in right shoulder: Secondary | ICD-10-CM | POA: Diagnosis not present

## 2020-03-21 ENCOUNTER — Other Ambulatory Visit: Payer: Self-pay

## 2020-03-21 ENCOUNTER — Ambulatory Visit (INDEPENDENT_AMBULATORY_CARE_PROVIDER_SITE_OTHER): Payer: Medicare PPO | Admitting: Family Medicine

## 2020-03-21 ENCOUNTER — Encounter: Payer: Self-pay | Admitting: Family Medicine

## 2020-03-21 VITALS — BP 136/82 | HR 63 | Temp 98.1°F | Resp 20 | Ht 73.0 in | Wt 165.0 lb

## 2020-03-21 DIAGNOSIS — E785 Hyperlipidemia, unspecified: Secondary | ICD-10-CM

## 2020-03-21 DIAGNOSIS — E119 Type 2 diabetes mellitus without complications: Secondary | ICD-10-CM

## 2020-03-21 DIAGNOSIS — I10 Essential (primary) hypertension: Secondary | ICD-10-CM

## 2020-03-21 DIAGNOSIS — E1169 Type 2 diabetes mellitus with other specified complication: Secondary | ICD-10-CM

## 2020-03-21 DIAGNOSIS — Z794 Long term (current) use of insulin: Secondary | ICD-10-CM

## 2020-03-21 DIAGNOSIS — E782 Mixed hyperlipidemia: Secondary | ICD-10-CM | POA: Diagnosis not present

## 2020-03-21 DIAGNOSIS — I482 Chronic atrial fibrillation, unspecified: Secondary | ICD-10-CM | POA: Diagnosis not present

## 2020-03-21 DIAGNOSIS — Z86718 Personal history of other venous thrombosis and embolism: Secondary | ICD-10-CM | POA: Diagnosis not present

## 2020-03-21 DIAGNOSIS — D6859 Other primary thrombophilia: Secondary | ICD-10-CM

## 2020-03-21 DIAGNOSIS — N529 Male erectile dysfunction, unspecified: Secondary | ICD-10-CM | POA: Diagnosis not present

## 2020-03-21 LAB — LIPID PANEL
Chol/HDL Ratio: 2.1 ratio (ref 0.0–5.0)
Cholesterol, Total: 123 mg/dL (ref 100–199)
HDL: 59 mg/dL (ref >39)
LDL Chol Calc (NIH): 53 mg/dL (ref 0–99)
Triglycerides: 44 mg/dL (ref 0–149)
VLDL Cholesterol Cal: 11 mg/dL (ref 5–40)

## 2020-03-21 LAB — CMP14+EGFR
ALT: 29 IU/L (ref 0–44)
AST: 24 IU/L (ref 0–40)
Albumin/Globulin Ratio: 2 (ref 1.2–2.2)
Albumin: 4.3 g/dL (ref 3.8–4.8)
Alkaline Phosphatase: 65 IU/L (ref 44–121)
BUN/Creatinine Ratio: 19 (ref 10–24)
BUN: 19 mg/dL (ref 8–27)
Bilirubin Total: 0.5 mg/dL (ref 0.0–1.2)
CO2: 24 mmol/L (ref 20–29)
Calcium: 9.5 mg/dL (ref 8.6–10.2)
Chloride: 105 mmol/L (ref 96–106)
Creatinine, Ser: 1 mg/dL (ref 0.76–1.27)
GFR calc Af Amer: 88 mL/min/{1.73_m2} (ref >59)
GFR calc non Af Amer: 76 mL/min/{1.73_m2} (ref >59)
Globulin, Total: 2.1 g/dL (ref 1.5–4.5)
Glucose: 154 mg/dL — ABNORMAL HIGH (ref 65–99)
Potassium: 5.1 mmol/L (ref 3.5–5.2)
Sodium: 142 mmol/L (ref 134–144)
Total Protein: 6.4 g/dL (ref 6.0–8.5)

## 2020-03-21 LAB — CBC WITH DIFFERENTIAL/PLATELET
Basophils Absolute: 0 10*3/uL (ref 0.0–0.2)
Basos: 1 %
EOS (ABSOLUTE): 0.1 10*3/uL (ref 0.0–0.4)
Eos: 1 %
Hematocrit: 42 % (ref 37.5–51.0)
Hemoglobin: 14.2 g/dL (ref 13.0–17.7)
Immature Grans (Abs): 0 10*3/uL (ref 0.0–0.1)
Immature Granulocytes: 0 %
Lymphocytes Absolute: 1.3 10*3/uL (ref 0.7–3.1)
Lymphs: 26 %
MCH: 32.1 pg (ref 26.6–33.0)
MCHC: 33.8 g/dL (ref 31.5–35.7)
MCV: 95 fL (ref 79–97)
Monocytes Absolute: 0.4 10*3/uL (ref 0.1–0.9)
Monocytes: 7 %
Neutrophils Absolute: 3.2 10*3/uL (ref 1.4–7.0)
Neutrophils: 65 %
Platelets: 157 10*3/uL (ref 150–450)
RBC: 4.42 x10E6/uL (ref 4.14–5.80)
RDW: 11.8 % (ref 11.6–15.4)
WBC: 5 10*3/uL (ref 3.4–10.8)

## 2020-03-21 LAB — BAYER DCA HB A1C WAIVED: HB A1C (BAYER DCA - WAIVED): 6.8 % (ref <7.0)

## 2020-03-21 LAB — COAGUCHEK XS/INR WAIVED
INR: 2.2 — ABNORMAL HIGH (ref 0.9–1.1)
Prothrombin Time: 26.7 s

## 2020-03-21 NOTE — Progress Notes (Addendum)
BP 136/82   Pulse 63   Temp 98.1 F (36.7 C) (Temporal)   Resp 20   Ht 6\' 1"  (1.854 m)   Wt 165 lb (74.8 kg)   SpO2 100%   BMI 21.77 kg/m    Subjective:   [name]: Christopher Avery, male    DOB: [birth year], 70 y.o.   MRN: [id number]  HPI: Christopher Avery is a 70 y.o. male presenting on 03/21/2020 for Medical Management of Chronic Issues   HPI Hyperlipidemia Patient is coming in for recheck of his hyperlipidemia. The patient is currently taking atorvastatin. They deny any issues with myalgias or history of liver damage from it. They deny any focal numbness or weakness or chest pain.   Type 2 diabetes mellitus Patient comes in today for recheck of his diabetes. Patient has been currently taking NovoLog as needed. Patient is not currently on an ACE inhibitor/ARB. Patient has not seen an ophthalmologist this year. Patient denies any issues with their feet. The symptom started onset as an adult hypertension and hyperlipidemia ARE RELATED TO DM   Coumadin recheck Target goal: 2.0-3.0 Reason on anticoagulation: Chronic A. fib and history of DVT Patient denies any bruising or bleeding or chest pain or palpitations   Hypertension Patient is currently on diltiazem, and their blood pressure today is 136/82. Patient denies any lightheadedness or dizziness. Patient denies headaches, blurred vision, chest pains, shortness of breath, or weakness. Denies any side effects from medication and is content with current medication.   Patient is complaining of erectile dysfunction at times, he has been using the Viagra but over the past month is not working as well.  We discussed other options versus going to [facility] and he would like to hold off on changes and try a little bit further and make sure he stays hydrated and keep his sugars down and if still not working we may consider Cialis or urology referral in the future.  Relevant past medical, surgical, family and social history reviewed and  updated as indicated. Interim medical history since our last visit reviewed. Allergies and medications reviewed and updated.  Review of Systems  Constitutional: Negative for chills and fever.  Eyes: Negative for visual disturbance.  Respiratory: Negative for shortness of breath and wheezing.   Cardiovascular: Negative for chest pain and leg swelling.  Musculoskeletal: Negative for back pain and gait problem.  Skin: Negative for rash.  Neurological: Negative for dizziness, weakness and light-headedness.  All other systems reviewed and are negative.   Per HPI unless specifically indicated above   Allergies as of 03/21/2020      Reactions   Januvia [sitagliptin] Hives      Medication List       Accurate as of March 21, 2020 11:51 AM. If you have any questions, ask your nurse or doctor.        STOP taking these medications   furosemide 20 MG tablet Commonly known as: LASIX Stopped by: Fransisca Kaufmann Sarahy Creedon, MD     TAKE these medications   atorvastatin 20 MG tablet Commonly known as: LIPITOR TAKE 1 TABLET BY MOUTH EVERY DAY   BD Pen Needle Nano 2nd Gen 32G X 4 MM Misc Generic drug: Insulin Pen Needle USE WITH INSULIN 4 TIMES A DAY DX E11.9   co-enzyme Q-10 30 MG capsule Take 100 mg by mouth daily.   diltiazem 240 MG 24 hr capsule Commonly known as: CARDIZEM CD TAKE 1 CAPSULE BY MOUTH EVERY DAY  fluticasone 50 MCG/ACT nasal spray Commonly known as: FLONASE SPRAY 2 SPRAYS INTO EACH NOSTRIL EVERY DAY   NovoLOG FlexPen 100 UNIT/ML FlexPen Generic drug: insulin aspart   sildenafil 20 MG tablet Commonly known as: REVATIO TAKE 2-5 TABLETS AS NEEDED PRIOR TO SEXUAL ACTIVITY   valACYclovir 500 MG tablet Commonly known as: VALTREX TAKE 4 TABLETS BY MOUTH TWICE DAILY AS DIRECTED AND AS NEEDED   Vitamin D3 125 MCG (5000 UT) Caps Take 1 capsule by mouth daily. Takes 6 days a week   warfarin 2.5 MG tablet Commonly known as: COUMADIN Take as directed by the  anticoagulation clinic. If you are unsure how to take this medication, talk to your nurse or doctor. Original instructions: TAKE 1 TO 1 AND 1/2 TABLETS DAILY AS DIRECTED        Objective:   BP 136/82   Pulse 63   Temp 98.1 F (36.7 C) (Temporal)   Resp 20   Ht 6\' 1"  (1.854 m)   Wt 165 lb (74.8 kg)   SpO2 100%   BMI 21.77 kg/m   Wt Readings from Last 3 Encounters:  03/21/20 165 lb (74.8 kg)  12/27/19 155 lb 3.2 oz (70.4 kg)  09/07/19 156 lb 6.4 oz (70.9 kg)    Physical Exam Vitals and nursing note reviewed.  Constitutional:      General: He is not in acute distress.    Appearance: He is well-developed and well-nourished. He is not diaphoretic.  Eyes:     General: No scleral icterus.    Extraocular Movements: EOM normal.     Conjunctiva/sclera: Conjunctivae normal.  Neck:     Thyroid: No thyromegaly.  Cardiovascular:     Rate and Rhythm: Normal rate and regular rhythm.     Pulses: Intact distal pulses.     Heart sounds: Normal heart sounds. No murmur heard.   Pulmonary:     Effort: Pulmonary effort is normal. No respiratory distress.     Breath sounds: Normal breath sounds. No wheezing.  Musculoskeletal:        General: No edema. Normal range of motion.     Cervical back: Neck supple.  Lymphadenopathy:     Cervical: No cervical adenopathy.  Skin:    General: Skin is warm and dry.     Findings: No rash.  Neurological:     Mental Status: He is alert and oriented to person, place, and time.     Coordination: Coordination normal.  Psychiatric:        Mood and Affect: Mood and affect normal.        Behavior: Behavior normal.     Results for orders placed or performed in visit on 03/21/20  Bayer DCA Hb A1c Waived  Result Value Ref Range   HB A1C (BAYER DCA - WAIVED) 6.8 <7.0 %    Assessment & Plan:   Problem List Items Addressed This Visit      Cardiovascular and Mediastinum   Benign essential HTN   Chronic atrial fibrillation (HCC)     Endocrine    Type 2 diabetes mellitus treated with insulin ([place])     Hematopoietic and Hemostatic   Primary hypercoagulable state ([place]) [D68.59]     Other   Hyperlipidemia   History of DVT of lower extremity - Primary   Relevant Orders   CoaguChek XS/INR Waived    Other Visit Diagnoses    Hyperlipidemia associated with type 2 diabetes mellitus ([place])       Erectile dysfunction, unspecified  erectile dysfunction type          Continue current medication, no changes, A1c looks good at 6.8.  Description   No changes to current dose INR today 2.2 (goal is 2-3)   Return in 6 to 8 weeks     Follow up plan: Return if symptoms worsen or fail to improve, for 6 to 8-week INR recheck.  Counseling provided for all of the vaccine components Orders Placed This Encounter  Procedures  . CoaguChek XS/INR [place], MD [facility] 03/21/2020, 11:51 AM

## 2020-03-26 ENCOUNTER — Telehealth: Payer: Self-pay

## 2020-03-27 ENCOUNTER — Telehealth: Payer: Self-pay

## 2020-03-27 NOTE — Telephone Encounter (Signed)
Pt informed that EKG was performed at Dr. Kennon Holter office in August last year. Pt would like for his office to perform the next EKG and if labs are required for surgical clearance, he will have his office do that as well.

## 2020-04-19 DIAGNOSIS — E1165 Type 2 diabetes mellitus with hyperglycemia: Secondary | ICD-10-CM | POA: Diagnosis not present

## 2020-04-20 ENCOUNTER — Other Ambulatory Visit: Payer: Self-pay | Admitting: Family Medicine

## 2020-04-27 ENCOUNTER — Other Ambulatory Visit: Payer: Self-pay

## 2020-04-27 ENCOUNTER — Encounter: Payer: Self-pay | Admitting: Family Medicine

## 2020-04-27 ENCOUNTER — Ambulatory Visit (INDEPENDENT_AMBULATORY_CARE_PROVIDER_SITE_OTHER): Payer: Medicare PPO | Admitting: Family Medicine

## 2020-04-27 VITALS — BP 148/83 | HR 67 | Ht 73.0 in | Wt 163.0 lb

## 2020-04-27 DIAGNOSIS — Z01818 Encounter for other preprocedural examination: Secondary | ICD-10-CM | POA: Diagnosis not present

## 2020-04-27 NOTE — Progress Notes (Signed)
BP (!) 148/83   Pulse 67   Ht 6\' 1"  (1.854 m)   Wt 163 lb (73.9 kg)   SpO2 98%   BMI 21.51 kg/m    Subjective:   [name]: Christopher Avery, male    DOB: 10/25/1950, 70 y.o.   MRN: [id number]  HPI: CYPHER PAULE is a 70 y.o. male presenting on 04/27/2020 for Surgical Clearance (Right rotator cuff/)   HPI Preoperative clearance Patient is coming in for preoperative clearance for right rotator cuff repair that is going to be performed by Dr. Susa Day.  He was recently diagnosed with A. fib and has a cardiologist and Dr. Alvester Chou.  We will do EKG today and send it to Dr. Gwenlyn Found.  Otherwise his A1c is under control most recently and his INR looks good most recently but will likely have to check it before the surgery again.  Relevant past medical, surgical, family and social history reviewed and updated as indicated. Interim medical history since our last visit reviewed. Allergies and medications reviewed and updated.  Review of Systems  Constitutional: Negative for chills and fever.  Eyes: Negative for visual disturbance.  Respiratory: Negative for shortness of breath and wheezing.   Cardiovascular: Negative for chest pain and leg swelling.  Musculoskeletal: Negative for back pain and gait problem.  Skin: Negative for rash.  Neurological: Negative for dizziness, weakness and light-headedness.  All other systems reviewed and are negative.   Per HPI unless specifically indicated above   Allergies as of 04/27/2020      Reactions   Januvia [sitagliptin] Hives      Medication List       Accurate as of April 27, 2020 10:04 AM. If you have any questions, ask your nurse or doctor.        atorvastatin 20 MG tablet Commonly known as: LIPITOR TAKE 1 TABLET BY MOUTH EVERY DAY   BD Pen Needle Nano 2nd Gen 32G X 4 MM Misc Generic drug: Insulin Pen Needle USE WITH INSULIN 4 TIMES A DAY DX E11.9   co-enzyme Q-10 30 MG capsule Take 100 mg by mouth daily.   diltiazem 240 MG  24 hr capsule Commonly known as: CARDIZEM CD TAKE 1 CAPSULE BY MOUTH EVERY DAY   fluticasone 50 MCG/ACT nasal spray Commonly known as: FLONASE SPRAY 2 SPRAYS INTO EACH NOSTRIL EVERY DAY   NovoLOG FlexPen 100 UNIT/ML FlexPen Generic drug: insulin aspart   sildenafil 20 MG tablet Commonly known as: REVATIO TAKE 2-5 TABLETS AS NEEDED PRIOR TO SEXUAL ACTIVITY   valACYclovir 500 MG tablet Commonly known as: VALTREX TAKE 4 TABLETS BY MOUTH TWICE DAILY AS DIRECTED AND AS NEEDED   Vitamin D3 125 MCG (5000 UT) Caps Take 1 capsule by mouth daily. Takes 6 days a week   warfarin 2.5 MG tablet Commonly known as: COUMADIN Take as directed by the anticoagulation clinic. If you are unsure how to take this medication, talk to your nurse or doctor. Original instructions: TAKE 1 TO 1 AND 1/2 TABLETS DAILY AS DIRECTED        Objective:   BP (!) 148/83   Pulse 67   Ht 6\' 1"  (1.854 m)   Wt 163 lb (73.9 kg)   SpO2 98%   BMI 21.51 kg/m   Wt Readings from Last 3 Encounters:  04/27/20 163 lb (73.9 kg)  03/21/20 165 lb (74.8 kg)  12/27/19 155 lb 3.2 oz (70.4 kg)    Physical Exam Vitals and nursing note reviewed.  Constitutional:      General: He is not in acute distress.    Appearance: He is well-developed. He is not diaphoretic.  Eyes:     General: No scleral icterus.    Conjunctiva/sclera: Conjunctivae normal.  Neck:     Thyroid: No thyromegaly.  Cardiovascular:     Rate and Rhythm: Normal rate. Rhythm irregular.     Heart sounds: Normal heart sounds. No murmur heard.   Pulmonary:     Effort: Pulmonary effort is normal. No respiratory distress.     Breath sounds: Normal breath sounds. No wheezing.  Musculoskeletal:        General: Normal range of motion.     Cervical back: Neck supple.  Lymphadenopathy:     Cervical: No cervical adenopathy.  Skin:    General: Skin is warm and dry.     Findings: No rash.  Neurological:     Mental Status: He is alert and oriented to  person, place, and time.     Coordination: Coordination normal.  Psychiatric:        Behavior: Behavior normal.     EKG: A. fib, rate controlled with PVCs, unchanged from previous  Assessment & Plan:   Problem List Items Addressed This Visit   None   Visit Diagnoses    Preoperative clearance    -  Primary   Relevant Orders   EKG 12-Lead (Completed)      Cleared for surgery, still is going to [facility] and will fax information cardiology into his Ortho. Follow up plan: Return if symptoms worsen or fail to improve.  Counseling provided for all of the vaccine components Orders Placed This Encounter  Procedures  . EKG 12-Lead    Caryl Pina, MD [facility] 04/27/2020, 10:04 AM

## 2020-04-30 ENCOUNTER — Telehealth: Payer: Self-pay | Admitting: *Deleted

## 2020-04-30 NOTE — Telephone Encounter (Signed)
   [facility] Pre-operative Risk Assessment      Request for surgical clearance:  1. What type of surgery is being performed? Right shoulder decompression and rotator cuff  2. When is this surgery scheduled?  tbd  3. What type of clearance is required (medical clearance vs. Pharmacy clearance to hold med vs. Both)? Both   4. Are there any medications that need to be held prior to surgery and how long? warfarin  5. Practice name and name of physician performing surgery?  [facility]; Dr Susa Day   6. What is the office phone number? [phone]   7.   What is the office fax number? [facility]  8.   Anesthesia type (None, local, MAC, general) ? unknown   Raiford Simmonds 04/30/2020, 3:21 PM  _________________________________________________________________   (provider comments below)

## 2020-05-01 NOTE — Telephone Encounter (Signed)
Patient with diagnosis of A Fib and history of DVT on warfarin for anticoagulation.    Procedure: Right shoulder decompression and rotator cuff  Date of procedure: TBD  Patient's warfarin and INR are managed by PCP.  Coag clearance should be routed to Dr Dettinger's office

## 2020-05-01 NOTE — Telephone Encounter (Signed)
   Primary Cardiologist: Dr. Quay Burow, MD   Chart reviewed as part of pre-operative protocol coverage. Because of FERGUSON GERTNER past medical history and time since last visit, he will require a follow-up visit in order to better assess preoperative cardiovascular risk.  Pre-op covering staff: - The patient already has an upcoming appointment 05/09/20 with Dr. Gwenlyn Found, please add "pre-op clearance" to the appointment notes so provider is aware. - Please contact requesting surgeon's office via preferred method (i.e, phone, fax) to inform them of need for appointment prior to surgery.  The patients Coumadin is managed by his PCP. Please let their team known that holding and/or bridging will need to come from them.    Kathyrn Drown, NP  05/01/2020, 9:27 AM

## 2020-05-02 NOTE — Telephone Encounter (Signed)
Pt has appt with Dr. Gwenlyn Found 05/04/20. Will update appt notes for pre op clearance needed. Will send FYI to requesting office.

## 2020-05-03 ENCOUNTER — Other Ambulatory Visit: Payer: Self-pay | Admitting: Family Medicine

## 2020-05-03 NOTE — Telephone Encounter (Signed)
   Primary Cardiologist: Quay Burow, MD  Chart reviewed as part of pre-operative protocol coverage. Given past medical history and time since last visit, based on ACC/AHA guidelines, Christopher Avery would be at acceptable risk for the planned procedure without further cardiovascular testing.   Patient's Coumadin is managed by his PCP.  Recommendations for holding or bridging the medication will need to come from prescribing provider.  I will route this recommendation to the requesting party via Epic fax function and remove from pre-op pool.  Please call with questions.  Christopher Ng. Klaryssa Fauth NP-C    05/03/2020, 2:45 PM [facility] [address] Office [phone] Fax [phone]

## 2020-05-03 NOTE — Telephone Encounter (Signed)
Cleared for his Ortho Surg

## 2020-05-04 ENCOUNTER — Other Ambulatory Visit: Payer: Self-pay

## 2020-05-04 ENCOUNTER — Encounter: Payer: Self-pay | Admitting: Cardiovascular Disease

## 2020-05-04 ENCOUNTER — Ambulatory Visit: Payer: Medicare PPO | Admitting: Cardiovascular Disease

## 2020-05-04 VITALS — BP 142/74 | HR 55 | Ht 72.0 in | Wt 164.0 lb

## 2020-05-04 DIAGNOSIS — I10 Essential (primary) hypertension: Secondary | ICD-10-CM | POA: Diagnosis not present

## 2020-05-04 DIAGNOSIS — E782 Mixed hyperlipidemia: Secondary | ICD-10-CM | POA: Diagnosis not present

## 2020-05-04 DIAGNOSIS — Z01818 Encounter for other preprocedural examination: Secondary | ICD-10-CM

## 2020-05-04 DIAGNOSIS — I482 Chronic atrial fibrillation, unspecified: Secondary | ICD-10-CM | POA: Diagnosis not present

## 2020-05-04 DIAGNOSIS — R0989 Other specified symptoms and signs involving the circulatory and respiratory systems: Secondary | ICD-10-CM

## 2020-05-04 NOTE — Assessment & Plan Note (Signed)
History of hyperlipidemia on atorvastatin with lipid profile performed 03/21/2020 revealing a total cholesterol 123, LDL 53 and HDL 59.

## 2020-05-04 NOTE — Patient Instructions (Signed)
Medication Instructions:  °Your physician recommends that you continue on your current medications as directed. Please refer to the Current Medication list given to you today. ° °*If you need a refill on your cardiac medications before your next appointment, please call your pharmacy* ° °Lab Work: °NONE ordered at this time of appointment  ° °If you have labs (blood work) drawn today and your tests are completely normal, you will receive your results only by: °MyChart Message (if you have MyChart) OR °A paper copy in the mail °If you have any lab test that is abnormal or we need to change your treatment, we will call you to review the results. ° °Testing/Procedures: °NONE ordered at this time of appointment  ° °Follow-Up: °At [facility], you and your health needs are our priority.  As part of our continuing mission to provide you with exceptional heart care, we have created designated Provider Care Teams.  These Care Teams include your primary Cardiologist (physician) and Advanced Practice Providers (APPs -  Physician Assistants and Nurse Practitioners) who all work together to provide you with the care you need, when you need it. ° °Your next appointment:   °1 year(s) ° °The format for your next appointment:   °In Person ° °Provider:   °Jonathan Berry, MD   ° °Other Instructions ° ° °

## 2020-05-04 NOTE — Assessment & Plan Note (Signed)
History of essential hypertension a blood pressure measured today at 142/74.  He is on diltiazem.

## 2020-05-04 NOTE — Assessment & Plan Note (Signed)
History of chronic A. fib rate controlled on warfarin oral anticoagulation.

## 2020-05-04 NOTE — Progress Notes (Signed)
05/04/2020 [name]   12-02-1950  [id number]  Primary Physician Dettinger, Fransisca Kaufmann, MD Primary Cardiologist: Lorretta Harp MD Garret Reddish, [place], [place]  HPI:  Christopher Avery is a 70 y.o.  thin and fit-appearing married Caucasian male, father of 28, grandfather of 4 grandchildren, who I saw 09/07/2019.Marland KitchenHis mother-in-law, Christopher Avery, was a patientof minefor a long time and died 07-01-16.  He is accompanied by his wife Christopher Avery today, daughter of Christopher Avery. He is accompanied by his wife Christopher Avery today.Marland KitchenHe has a history of paroxysmal atrial fibrillation, maintaining sinus rhythm on Coumadin anticoagulation  His other problems include hypertension, hyperlipidemia, and family history of heart disease.  Since I saw him a year and a half ago he is done well.  He continues on Coumadin with therapeutic INRs.  He is unaware that he is in A. fib.  He denies chest pain or shortness of breath.  He is scheduled to have arthroscopic shoulder surgery in the upcoming future and is here for clearance.   Current Meds  Medication Sig  . atorvastatin (LIPITOR) 20 MG tablet TAKE 1 TABLET BY MOUTH EVERY DAY  . BD PEN NEEDLE NANO 2ND GEN 32G X 4 MM MISC USE WITH INSULIN 4 TIMES A DAY DX E11.9  . Cholecalciferol (VITAMIN D3) 125 MCG (5000 UT) CAPS Take 1 capsule by mouth daily. Takes 6 days a week  . co-enzyme Q-10 30 MG capsule Take 100 mg by mouth daily.  [name] diltiazem (CARDIZEM CD) 240 MG 24 hr capsule TAKE 1 CAPSULE BY MOUTH EVERY DAY  . fluticasone (FLONASE) 50 MCG/ACT nasal spray SPRAY 2 SPRAYS INTO EACH NOSTRIL EVERY DAY  . insulin glargine (LANTUS SOLOSTAR) 100 UNIT/ML Solostar Pen 19u  . NOVOLOG FLEXPEN 100 UNIT/ML FlexPen Sliding scale  . sildenafil (REVATIO) 20 MG tablet TAKE 2-5 TABLETS AS NEEDED PRIOR TO SEXUAL ACTIVITY  . valACYclovir (VALTREX) 500 MG tablet TAKE 4 TABLETS BY MOUTH TWICE DAILY AS DIRECTED AND AS NEEDED  . warfarin (COUMADIN) 2.5 MG tablet TAKE 1 TO 1 AND 1/2  TABLETS DAILY AS DIRECTED     Allergies  Allergen Reactions  . Januvia [Sitagliptin] Hives    Social History   Socioeconomic History  . Marital status: Married    Spouse name: Not on file  . Number of children: 2  . Years of education: 14 years  . Highest education level: Some college, no degree  Occupational History  . Occupation: Retired    Fish farm manager: [employer]  Tobacco Use  . Smoking status: Never Smoker  . Smokeless tobacco: Never Used  Vaping Use  . Vaping Use: Never used  Substance and Sexual Activity  . Alcohol use: Never    Alcohol/week: 0.0 standard drinks  . Drug use: No  . Sexual activity: Yes  Other Topics Concern  . Not on file  Social History Narrative  . Not on file   Social Determinants of Health   Financial Resource Strain: Not on file  Food Insecurity: Not on file  Transportation Needs: Not on file  Physical Activity: Not on file  Stress: Not on file  Social Connections: Not on file  Intimate Partner Violence: Not on file     Review of Systems: General: negative for chills, fever, night sweats or weight changes.  Cardiovascular: negative for chest pain, dyspnea on exertion, edema, orthopnea, palpitations, paroxysmal nocturnal dyspnea or shortness of breath Dermatological: negative for rash Respiratory: negative for cough or wheezing Urologic: negative for hematuria Abdominal:  negative for nausea, vomiting, diarrhea, bright red blood per rectum, melena, or hematemesis Neurologic: negative for visual changes, syncope, or dizziness All other systems reviewed and are otherwise negative except as noted above.    Blood pressure (!) 142/74, pulse (!) 55, height 6' (1.829 m), weight 164 lb (74.4 kg), SpO2 98 %.  General appearance: alert and no distress Neck: no adenopathy, no JVD, supple, symmetrical, trachea midline, thyroid not enlarged, symmetric, no tenderness/mass/nodules and Soft left carotid bruit Lungs: clear to auscultation  bilaterally Heart: irregularly irregular rhythm Extremities: extremities normal, atraumatic, no cyanosis or edema Pulses: 2+ and symmetric Skin: Skin color, texture, turgor normal. No rashes or lesions Neurologic: Alert and oriented X 3, normal strength and tone. Normal symmetric reflexes. Normal coordination and gait  EKG atrial fibrillation with a ventricular sponsor of 55 and occasional aberrantly conducted beats.  I personally reviewed this EKG.  ASSESSMENT AND PLAN:   Hyperlipidemia History of hyperlipidemia on atorvastatin with lipid profile performed 03/21/2020 revealing a total cholesterol 123, LDL 53 and HDL 59.  Benign essential HTN History of essential hypertension a blood pressure measured today at 142/74.  He is on diltiazem.  Chronic atrial fibrillation (HCC) History of chronic A. fib rate controlled on warfarin oral anticoagulation.  Left carotid bruit History of left carotid bruit with normal carotid Dopplers performed last year.      Lorretta Harp MD [place], [facility] 05/04/2020 12:25 PM

## 2020-05-04 NOTE — Assessment & Plan Note (Signed)
History of left carotid bruit with normal carotid Dopplers performed last year.

## 2020-05-09 ENCOUNTER — Ambulatory Visit: Payer: Medicare PPO | Admitting: Cardiovascular Disease

## 2020-05-14 ENCOUNTER — Ambulatory Visit (INDEPENDENT_AMBULATORY_CARE_PROVIDER_SITE_OTHER): Payer: Medicare PPO

## 2020-05-14 VITALS — Ht 72.0 in | Wt 164.0 lb

## 2020-05-14 DIAGNOSIS — E162 Hypoglycemia, unspecified: Secondary | ICD-10-CM | POA: Insufficient documentation

## 2020-05-14 DIAGNOSIS — E7800 Pure hypercholesterolemia, unspecified: Secondary | ICD-10-CM | POA: Insufficient documentation

## 2020-05-14 DIAGNOSIS — E1165 Type 2 diabetes mellitus with hyperglycemia: Secondary | ICD-10-CM | POA: Insufficient documentation

## 2020-05-14 DIAGNOSIS — Z0000 Encounter for general adult medical examination without abnormal findings: Secondary | ICD-10-CM | POA: Diagnosis not present

## 2020-05-14 NOTE — Patient Instructions (Signed)
[name] , Thank you for taking time to come for your Medicare Wellness Visit. I appreciate your ongoing commitment to your health goals. Please review the following plan we discussed and let me know if I can assist you in the future.   Screening recommendations/referrals: Colonoscopy: Done 09/20/2011 - Repeat in 3 years Recommended yearly ophthalmology/optometry visit for glaucoma screening and checkup Recommended yearly dental visit for hygiene and checkup  Vaccinations: Influenza vaccine: Done 12/27/2019 - Repeat annually Pneumococcal vaccine: Done 04/30/2016 & 12/21/2012 Tdap vaccine: Done 07/12/2014 - Repeat in 10 years Shingles vaccine: Shingrix discussed. Please contact your pharmacy for coverage information. (If you find you have had these, please let us know the dates to be added to your chart)   Covid-19: Done 03/01/19, 03/29/19, & 11/29/2019  Advanced directives: Advance directive discussed with you today. I have provided a copy for you to complete at home and have notarized. Once this is complete please bring a copy in to our office so we can scan it into your chart.  Conditions/risks identified: Keep up the great work!  Next appointment: Follow up in one year for your annual wellness visit.   Preventive Care 69 Years and Older, Male  Preventive care refers to lifestyle choices and visits with your health care provider that can promote health and wellness. What does preventive care include?  A yearly physical exam. This is also called an annual well check.  Dental exams once or twice a year.  Routine eye exams. Ask your health care provider how often you should have your eyes checked.  Personal lifestyle choices, including:  Daily care of your teeth and gums.  Regular physical activity.  Eating a healthy diet.  Avoiding tobacco and drug use.  Limiting alcohol use.  Practicing safe sex.  Taking low doses of aspirin every day.  Taking vitamin and mineral  supplements as recommended by your health care provider. What happens during an annual well check? The services and screenings done by your health care provider during your annual well check will depend on your age, overall health, lifestyle risk factors, and family history of disease. Counseling  Your health care provider may ask you questions about your:  Alcohol use.  Tobacco use.  Drug use.  Emotional well-being.  Home and relationship well-being.  Sexual activity.  Eating habits.  History of falls.  Memory and ability to understand (cognition).  Work and work Statistician. Screening  You may have the following tests or measurements:  Height, weight, and BMI.  Blood pressure.  Lipid and cholesterol levels. These may be checked every 5 years, or more frequently if you are over 69 years old.  Skin check.  Lung cancer screening. You may have this screening every year starting at age 69 if you have a 30-pack-year history of smoking and currently smoke or have quit within the past 15 years.  Fecal occult blood test (FOBT) of the stool. You may have this test every year starting at age 69.  Flexible sigmoidoscopy or colonoscopy. You may have a sigmoidoscopy every 5 years or a colonoscopy every 10 years starting at age 69.  Prostate cancer screening. Recommendations will vary depending on your family history and other risks.  Hepatitis C blood test.  Hepatitis B blood test.  Sexually transmitted disease (STD) testing.  Diabetes screening. This is done by checking your blood sugar (glucose) after you have not eaten for a while (fasting). You may have this done every 1-3 years.  Abdominal aortic aneurysm (  AAA) screening. You may need this if you are a current or former smoker.  Osteoporosis. You may be screened starting at age 69 if you are at high risk. Talk with your health care provider about your test results, treatment options, and if necessary, the need for more  tests. Vaccines  Your health care provider may recommend certain vaccines, such as:  Influenza vaccine. This is recommended every year.  Tetanus, diphtheria, and acellular pertussis (Tdap, Td) vaccine. You may need a Td booster every 10 years.  Zoster vaccine. You may need this after age 69.  Pneumococcal 13-valent conjugate (PCV13) vaccine. One dose is recommended after age 69.  Pneumococcal polysaccharide (PPSV23) vaccine. One dose is recommended after age 69. Talk to your health care provider about which screenings and vaccines you need and how often you need them. This information is not intended to replace advice given to you by your health care provider. Make sure you discuss any questions you have with your health care provider. Document Released: 02/16/2015 Document Revised: 10/10/2015 Document Reviewed: 11/21/2014 Elsevier Interactive Patient Education  2017 [place] Prevention in the Home Falls can cause injuries. They can happen to people of all ages. There are many things you can do to make your home safe and to help prevent falls. What can I do on the outside of my home?  Regularly fix the edges of walkways and driveways and fix any cracks.  Remove anything that might make you trip as you walk through a door, such as a raised step or threshold.  Trim any bushes or trees on the path to your home.  Use bright outdoor lighting.  Clear any walking paths of anything that might make someone trip, such as rocks or tools.  Regularly check to see if handrails are loose or broken. Make sure that both sides of any steps have handrails.  Any raised decks and porches should have guardrails on the edges.  Have any leaves, snow, or ice cleared regularly.  Use sand or salt on walking paths during winter.  Clean up any spills in your garage right away. This includes oil or grease spills. What can I do in the bathroom?  Use night lights.  Install grab bars by the  toilet and in the tub and shower. Do not use towel bars as grab bars.  Use non-skid mats or decals in the tub or shower.  If you need to sit down in the shower, use a plastic, non-slip stool.  Keep the floor dry. Clean up any water that spills on the floor as soon as it happens.  Remove soap buildup in the tub or shower regularly.  Attach bath mats securely with double-sided non-slip rug tape.  Do not have throw rugs and other things on the floor that can make you trip. What can I do in the bedroom?  Use night lights.  Make sure that you have a light by your bed that is easy to reach.  Do not use any sheets or blankets that are too big for your bed. They should not hang down onto the floor.  Have a firm chair that has side arms. You can use this for support while you get dressed.  Do not have throw rugs and other things on the floor that can make you trip. What can I do in the kitchen?  Clean up any spills right away.  Avoid walking on wet floors.  Keep items that you use a lot in  easy-to-reach places.  If you need to reach something above you, use a strong step stool that has a grab bar.  Keep electrical cords out of the way.  Do not use floor polish or wax that makes floors slippery. If you must use wax, use non-skid floor wax.  Do not have throw rugs and other things on the floor that can make you trip. What can I do with my stairs?  Do not leave any items on the stairs.  Make sure that there are handrails on both sides of the stairs and use them. Fix handrails that are broken or loose. Make sure that handrails are as long as the stairways.  Check any carpeting to make sure that it is firmly attached to the stairs. Fix any carpet that is loose or worn.  Avoid having throw rugs at the top or bottom of the stairs. If you do have throw rugs, attach them to the floor with carpet tape.  Make sure that you have a light switch at the top of the stairs and the bottom of  the stairs. If you do not have them, ask someone to add them for you. What else can I do to help prevent falls?  Wear shoes that:  Do not have high heels.  Have rubber bottoms.  Are comfortable and fit you well.  Are closed at the toe. Do not wear sandals.  If you use a stepladder:  Make sure that it is fully opened. Do not climb a closed stepladder.  Make sure that both sides of the stepladder are locked into place.  Ask someone to hold it for you, if possible.  Clearly mark and make sure that you can see:  Any grab bars or handrails.  First and last steps.  Where the edge of each step is.  Use tools that help you move around (mobility aids) if they are needed. These include:  Canes.  Walkers.  Scooters.  Crutches.  Turn on the lights when you go into a dark area. Replace any light bulbs as soon as they burn out.  Set up your furniture so you have a clear path. Avoid moving your furniture around.  If any of your floors are uneven, fix them.  If there are any pets around you, be aware of where they are.  Review your medicines with your doctor. Some medicines can make you feel dizzy. This can increase your chance of falling. Ask your doctor what other things that you can do to help prevent falls. This information is not intended to replace advice given to you by your health care provider. Make sure you discuss any questions you have with your health care provider. Document Released: 11/16/2008 Document Revised: 06/28/2015 Document Reviewed: 02/24/2014 Elsevier Interactive Patient Education  2017 [employer].
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
## 2020-05-22 DIAGNOSIS — E1165 Type 2 diabetes mellitus with hyperglycemia: Secondary | ICD-10-CM | POA: Diagnosis not present

## 2020-05-26 ENCOUNTER — Other Ambulatory Visit: Payer: Self-pay | Admitting: Cardiovascular Disease

## 2020-05-29 DIAGNOSIS — E1165 Type 2 diabetes mellitus with hyperglycemia: Secondary | ICD-10-CM | POA: Diagnosis not present

## 2020-05-29 DIAGNOSIS — E7800 Pure hypercholesterolemia, unspecified: Secondary | ICD-10-CM | POA: Diagnosis not present

## 2020-05-29 DIAGNOSIS — E162 Hypoglycemia, unspecified: Secondary | ICD-10-CM | POA: Diagnosis not present

## 2020-05-29 DIAGNOSIS — I10 Essential (primary) hypertension: Secondary | ICD-10-CM | POA: Diagnosis not present

## 2020-06-04 DIAGNOSIS — M948X1 Other specified disorders of cartilage, shoulder: Secondary | ICD-10-CM | POA: Diagnosis not present

## 2020-06-04 DIAGNOSIS — S43431A Superior glenoid labrum lesion of right shoulder, initial encounter: Secondary | ICD-10-CM | POA: Diagnosis not present

## 2020-06-04 DIAGNOSIS — M7541 Impingement syndrome of right shoulder: Secondary | ICD-10-CM | POA: Diagnosis not present

## 2020-06-04 DIAGNOSIS — M75121 Complete rotator cuff tear or rupture of right shoulder, not specified as traumatic: Secondary | ICD-10-CM | POA: Diagnosis not present

## 2020-06-04 DIAGNOSIS — G8918 Other acute postprocedural pain: Secondary | ICD-10-CM | POA: Diagnosis not present

## 2020-06-04 DIAGNOSIS — M75111 Incomplete rotator cuff tear or rupture of right shoulder, not specified as traumatic: Secondary | ICD-10-CM | POA: Diagnosis not present

## 2020-06-04 DIAGNOSIS — M7521 Bicipital tendinitis, right shoulder: Secondary | ICD-10-CM | POA: Diagnosis not present

## 2020-06-04 DIAGNOSIS — M11211 Other chondrocalcinosis, right shoulder: Secondary | ICD-10-CM | POA: Diagnosis not present

## 2020-06-12 ENCOUNTER — Ambulatory Visit: Payer: Medicare PPO | Admitting: Nurse Practitioner

## 2020-06-26 ENCOUNTER — Ambulatory Visit: Payer: Medicare PPO

## 2020-07-03 ENCOUNTER — Other Ambulatory Visit: Payer: Self-pay

## 2020-07-03 ENCOUNTER — Ambulatory Visit: Payer: Medicare PPO | Attending: Specialist

## 2020-07-03 DIAGNOSIS — Z4889 Encounter for other specified surgical aftercare: Secondary | ICD-10-CM | POA: Diagnosis not present

## 2020-07-03 DIAGNOSIS — M6281 Muscle weakness (generalized): Secondary | ICD-10-CM | POA: Insufficient documentation

## 2020-07-03 DIAGNOSIS — M25511 Pain in right shoulder: Secondary | ICD-10-CM

## 2020-07-03 NOTE — Addendum Note (Signed)
Addended by: Marylou Mccoy on: 07/03/2020 01:32 PM   Modules accepted: Orders
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
## 2020-07-10 ENCOUNTER — Other Ambulatory Visit: Payer: Self-pay

## 2020-07-10 ENCOUNTER — Ambulatory Visit: Payer: Medicare PPO | Attending: Specialist

## 2020-07-10 DIAGNOSIS — M25511 Pain in right shoulder: Secondary | ICD-10-CM

## 2020-07-10 DIAGNOSIS — Z4889 Encounter for other specified surgical aftercare: Secondary | ICD-10-CM | POA: Diagnosis not present

## 2020-07-10 DIAGNOSIS — M6281 Muscle weakness (generalized): Secondary | ICD-10-CM | POA: Diagnosis not present
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
## 2020-07-12 ENCOUNTER — Other Ambulatory Visit: Payer: Self-pay

## 2020-07-12 ENCOUNTER — Ambulatory Visit: Payer: Medicare PPO | Admitting: Physical Therapy

## 2020-07-12 ENCOUNTER — Encounter: Payer: Self-pay | Admitting: Physical Therapy

## 2020-07-12 DIAGNOSIS — M25511 Pain in right shoulder: Secondary | ICD-10-CM

## 2020-07-12 DIAGNOSIS — E1165 Type 2 diabetes mellitus with hyperglycemia: Secondary | ICD-10-CM | POA: Diagnosis not present

## 2020-07-12 DIAGNOSIS — Z4889 Encounter for other specified surgical aftercare: Secondary | ICD-10-CM | POA: Diagnosis not present

## 2020-07-12 DIAGNOSIS — M6281 Muscle weakness (generalized): Secondary | ICD-10-CM | POA: Diagnosis not present
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
## 2020-07-17 ENCOUNTER — Other Ambulatory Visit: Payer: Self-pay

## 2020-07-17 ENCOUNTER — Ambulatory Visit: Payer: Medicare PPO

## 2020-07-17 DIAGNOSIS — Z4889 Encounter for other specified surgical aftercare: Secondary | ICD-10-CM | POA: Diagnosis not present

## 2020-07-17 DIAGNOSIS — M6281 Muscle weakness (generalized): Secondary | ICD-10-CM | POA: Diagnosis not present

## 2020-07-17 DIAGNOSIS — M25511 Pain in right shoulder: Secondary | ICD-10-CM
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
## 2020-07-19 ENCOUNTER — Ambulatory Visit: Payer: Medicare PPO

## 2020-07-19 ENCOUNTER — Other Ambulatory Visit: Payer: Self-pay

## 2020-07-19 DIAGNOSIS — Z4889 Encounter for other specified surgical aftercare: Secondary | ICD-10-CM

## 2020-07-19 DIAGNOSIS — H11153 Pinguecula, bilateral: Secondary | ICD-10-CM | POA: Diagnosis not present

## 2020-07-19 DIAGNOSIS — H2513 Age-related nuclear cataract, bilateral: Secondary | ICD-10-CM | POA: Diagnosis not present

## 2020-07-19 DIAGNOSIS — M6281 Muscle weakness (generalized): Secondary | ICD-10-CM | POA: Diagnosis not present

## 2020-07-19 DIAGNOSIS — H353132 Nonexudative age-related macular degeneration, bilateral, intermediate dry stage: Secondary | ICD-10-CM | POA: Diagnosis not present

## 2020-07-19 DIAGNOSIS — E119 Type 2 diabetes mellitus without complications: Secondary | ICD-10-CM | POA: Diagnosis not present

## 2020-07-19 DIAGNOSIS — M25511 Pain in right shoulder: Secondary | ICD-10-CM

## 2020-07-19 LAB — HM DIABETES EYE EXAM
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
## 2020-07-20 ENCOUNTER — Telehealth: Payer: Self-pay | Admitting: Pulmonary Disease

## 2020-07-20 NOTE — Telephone Encounter (Signed)
Called and spoke with patient. He stated that he wanted to get scheduled for his yearly PFT. Patient was last seen by [facility] on 02/16/19 and was advised to follow up in one year with Dr. Elsworth Soho. There are currently no orders in the system for him to have a PFT. He wishes to have the PFT done here in [place].   Dr. Elsworth Soho, please advise if you are ok with [place] ordering a PFT and having him establish with you. Thanks.

## 2020-07-23 ENCOUNTER — Telehealth: Payer: Self-pay | Admitting: Family Medicine

## 2020-07-23 ENCOUNTER — Other Ambulatory Visit: Payer: Self-pay

## 2020-07-23 DIAGNOSIS — I482 Chronic atrial fibrillation, unspecified: Secondary | ICD-10-CM

## 2020-07-23 DIAGNOSIS — E119 Type 2 diabetes mellitus without complications: Secondary | ICD-10-CM

## 2020-07-23 NOTE — Telephone Encounter (Signed)
Appt scheduled for 6/23. Pt did not make a follow up appt in Feb when he was here for his INR check. He did have a pre op appt with [facility]. However it was too soon to check INR at that time. Pt has since had his procedure. Just need follow up INR.

## 2020-07-23 NOTE — Telephone Encounter (Signed)
Called and spoke with patient. He was ok with getting scheduled for an OV. Since [facility] does not have any openings and he needs to establish with another pulmonologist, I was able to get him scheduled for 08/28/20 at 130pm. He is aware of our office location.   Nothing further needed at time of call.

## 2020-07-23 NOTE — Telephone Encounter (Signed)
Pts wife called stating that pt fell on concrete and hit his butt. Says his butt is swollen and has a deep bruise. Wants to know with pt being on a blood thinner, is this a concern and do they need to be seen?  Please advise and call wife Christopher Avery)  [phone]

## 2020-07-26 ENCOUNTER — Ambulatory Visit: Payer: Medicare PPO | Admitting: Family Medicine

## 2020-07-26 ENCOUNTER — Encounter: Payer: Self-pay | Admitting: Family Medicine

## 2020-07-26 ENCOUNTER — Other Ambulatory Visit: Payer: Self-pay

## 2020-07-26 VITALS — BP 133/75 | HR 76 | Ht 72.0 in | Wt 157.0 lb

## 2020-07-26 DIAGNOSIS — Z86718 Personal history of other venous thrombosis and embolism: Secondary | ICD-10-CM

## 2020-07-26 DIAGNOSIS — I482 Chronic atrial fibrillation, unspecified: Secondary | ICD-10-CM | POA: Diagnosis not present

## 2020-07-26 DIAGNOSIS — D6859 Other primary thrombophilia: Secondary | ICD-10-CM | POA: Diagnosis not present

## 2020-07-26 LAB — COAGUCHEK XS/INR WAIVED
INR: 2.4 — ABNORMAL HIGH (ref 0.9–1.1)
Prothrombin Time: 28.5 s

## 2020-07-26 NOTE — Addendum Note (Signed)
Addended by: Alphonzo Dublin on: 07/26/2020 10:14 AM   Modules accepted: Orders
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
## 2020-07-27 ENCOUNTER — Other Ambulatory Visit: Payer: Self-pay

## 2020-07-27 ENCOUNTER — Ambulatory Visit: Payer: Medicare PPO

## 2020-07-27 DIAGNOSIS — M25511 Pain in right shoulder: Secondary | ICD-10-CM

## 2020-07-27 DIAGNOSIS — Z4889 Encounter for other specified surgical aftercare: Secondary | ICD-10-CM

## 2020-07-27 DIAGNOSIS — M6281 Muscle weakness (generalized): Secondary | ICD-10-CM
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
## 2020-08-13 ENCOUNTER — Ambulatory Visit: Payer: Medicare PPO | Attending: Specialist

## 2020-08-13 ENCOUNTER — Other Ambulatory Visit: Payer: Self-pay

## 2020-08-13 DIAGNOSIS — M25511 Pain in right shoulder: Secondary | ICD-10-CM

## 2020-08-13 DIAGNOSIS — M6281 Muscle weakness (generalized): Secondary | ICD-10-CM | POA: Diagnosis not present

## 2020-08-13 DIAGNOSIS — Z4889 Encounter for other specified surgical aftercare: Secondary | ICD-10-CM | POA: Diagnosis not present
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
## 2020-08-20 ENCOUNTER — Encounter: Payer: Self-pay | Admitting: Family Medicine

## 2020-08-28 ENCOUNTER — Encounter: Payer: Self-pay | Admitting: Pulmonary Disease

## 2020-08-28 ENCOUNTER — Other Ambulatory Visit: Payer: Self-pay

## 2020-08-28 ENCOUNTER — Ambulatory Visit (INDEPENDENT_AMBULATORY_CARE_PROVIDER_SITE_OTHER): Payer: Medicare PPO

## 2020-08-28 ENCOUNTER — Ambulatory Visit (INDEPENDENT_AMBULATORY_CARE_PROVIDER_SITE_OTHER): Payer: Worker's Compensation | Admitting: Pulmonary Disease

## 2020-08-28 VITALS — BP 110/72 | HR 62 | Ht 72.0 in | Wt 148.0 lb

## 2020-08-28 DIAGNOSIS — Z77090 Contact with and (suspected) exposure to asbestos: Secondary | ICD-10-CM | POA: Diagnosis not present

## 2020-08-28 NOTE — Patient Instructions (Signed)
We will check a chest x-ray today and schedule you for pulmonary function tests in the near future at your convenience.
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
## 2020-09-12 ENCOUNTER — Ambulatory Visit: Payer: Medicare PPO | Admitting: Family Medicine

## 2020-09-14 DIAGNOSIS — Z794 Long term (current) use of insulin: Secondary | ICD-10-CM | POA: Diagnosis not present

## 2020-09-14 DIAGNOSIS — D6869 Other thrombophilia: Secondary | ICD-10-CM | POA: Diagnosis not present

## 2020-09-14 DIAGNOSIS — Z7901 Long term (current) use of anticoagulants: Secondary | ICD-10-CM | POA: Diagnosis not present

## 2020-09-14 DIAGNOSIS — Z85820 Personal history of malignant melanoma of skin: Secondary | ICD-10-CM | POA: Diagnosis not present

## 2020-09-14 DIAGNOSIS — E785 Hyperlipidemia, unspecified: Secondary | ICD-10-CM | POA: Diagnosis not present

## 2020-09-14 DIAGNOSIS — Z8249 Family history of ischemic heart disease and other diseases of the circulatory system: Secondary | ICD-10-CM | POA: Diagnosis not present

## 2020-09-14 DIAGNOSIS — I4891 Unspecified atrial fibrillation: Secondary | ICD-10-CM | POA: Diagnosis not present

## 2020-09-14 DIAGNOSIS — E1165 Type 2 diabetes mellitus with hyperglycemia: Secondary | ICD-10-CM | POA: Diagnosis not present

## 2020-09-14 DIAGNOSIS — I10 Essential (primary) hypertension: Secondary | ICD-10-CM | POA: Diagnosis not present

## 2020-09-17 ENCOUNTER — Other Ambulatory Visit: Payer: Self-pay | Admitting: Family Medicine

## 2020-09-19 ENCOUNTER — Encounter: Payer: Self-pay | Admitting: Family Medicine

## 2020-09-25 ENCOUNTER — Ambulatory Visit: Payer: Medicare PPO | Admitting: Family Medicine

## 2020-09-25 ENCOUNTER — Encounter: Payer: Self-pay | Admitting: Family Medicine

## 2020-09-25 ENCOUNTER — Other Ambulatory Visit: Payer: Self-pay

## 2020-09-25 VITALS — BP 121/65 | HR 83 | Temp 98.1°F | Resp 20 | Ht 72.0 in | Wt 157.0 lb

## 2020-09-25 DIAGNOSIS — D6859 Other primary thrombophilia: Secondary | ICD-10-CM | POA: Diagnosis not present

## 2020-09-25 DIAGNOSIS — Z86718 Personal history of other venous thrombosis and embolism: Secondary | ICD-10-CM | POA: Diagnosis not present

## 2020-09-25 DIAGNOSIS — I482 Chronic atrial fibrillation, unspecified: Secondary | ICD-10-CM | POA: Diagnosis not present

## 2020-09-25 LAB — COAGUCHEK XS/INR WAIVED
INR: 2.1 — ABNORMAL HIGH (ref 0.9–1.1)
Prothrombin Time: 24.9 s

## 2020-09-25 LAB — POCT INR: INR: 2.1 (ref 2.0–3.0)
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
## 2020-09-27 ENCOUNTER — Other Ambulatory Visit: Payer: Self-pay | Admitting: Family Medicine

## 2020-09-28 ENCOUNTER — Other Ambulatory Visit: Payer: Self-pay | Admitting: Family Medicine

## 2020-10-01 DIAGNOSIS — E1165 Type 2 diabetes mellitus with hyperglycemia: Secondary | ICD-10-CM | POA: Diagnosis not present

## 2020-10-16 ENCOUNTER — Encounter: Payer: Self-pay | Admitting: *Deleted

## 2020-10-25 ENCOUNTER — Ambulatory Visit: Payer: Medicare PPO | Admitting: Family Medicine

## 2020-10-26 ENCOUNTER — Ambulatory Visit: Payer: Medicare PPO | Admitting: Family Medicine

## 2020-10-31 ENCOUNTER — Ambulatory Visit: Payer: Medicare PPO | Admitting: Family Medicine

## 2020-10-31 ENCOUNTER — Other Ambulatory Visit: Payer: Self-pay

## 2020-10-31 ENCOUNTER — Encounter: Payer: Self-pay | Admitting: Family Medicine

## 2020-10-31 VITALS — BP 134/65 | HR 67 | Temp 98.0°F | Resp 20 | Ht 72.0 in | Wt 162.0 lb

## 2020-10-31 DIAGNOSIS — Z7901 Long term (current) use of anticoagulants: Secondary | ICD-10-CM | POA: Diagnosis not present

## 2020-10-31 DIAGNOSIS — I482 Chronic atrial fibrillation, unspecified: Secondary | ICD-10-CM | POA: Diagnosis not present

## 2020-10-31 DIAGNOSIS — Z86718 Personal history of other venous thrombosis and embolism: Secondary | ICD-10-CM | POA: Diagnosis not present

## 2020-10-31 LAB — COAGUCHEK XS/INR WAIVED
INR: 2.5 — ABNORMAL HIGH (ref 0.9–1.1)
Prothrombin Time: 30.2 s

## 2020-10-31 LAB — POCT INR: INR: 2.5 (ref 2.0–3.0)
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
## 2020-11-01 ENCOUNTER — Other Ambulatory Visit: Payer: Self-pay | Admitting: Family Medicine

## 2020-11-20 DIAGNOSIS — Z23 Encounter for immunization: Secondary | ICD-10-CM | POA: Diagnosis not present

## 2020-11-20 DIAGNOSIS — G629 Polyneuropathy, unspecified: Secondary | ICD-10-CM | POA: Diagnosis not present

## 2020-11-20 DIAGNOSIS — R0683 Snoring: Secondary | ICD-10-CM | POA: Diagnosis not present

## 2020-11-20 DIAGNOSIS — I10 Essential (primary) hypertension: Secondary | ICD-10-CM | POA: Diagnosis not present

## 2020-11-20 DIAGNOSIS — E1165 Type 2 diabetes mellitus with hyperglycemia: Secondary | ICD-10-CM | POA: Diagnosis not present

## 2020-11-20 DIAGNOSIS — E7800 Pure hypercholesterolemia, unspecified: Secondary | ICD-10-CM | POA: Diagnosis not present

## 2020-11-20 DIAGNOSIS — E162 Hypoglycemia, unspecified: Secondary | ICD-10-CM | POA: Diagnosis not present

## 2020-12-07 ENCOUNTER — Other Ambulatory Visit: Payer: Self-pay | Admitting: Family Medicine

## 2020-12-13 ENCOUNTER — Other Ambulatory Visit: Payer: Self-pay

## 2020-12-13 ENCOUNTER — Ambulatory Visit: Payer: Medicare PPO | Admitting: Family Medicine

## 2020-12-13 ENCOUNTER — Encounter: Payer: Self-pay | Admitting: Family Medicine

## 2020-12-13 VITALS — BP 152/86 | HR 61 | Temp 97.9°F | Ht 72.0 in | Wt 161.0 lb

## 2020-12-13 DIAGNOSIS — R0681 Apnea, not elsewhere classified: Secondary | ICD-10-CM | POA: Diagnosis not present

## 2020-12-13 DIAGNOSIS — D6859 Other primary thrombophilia: Secondary | ICD-10-CM

## 2020-12-13 DIAGNOSIS — I482 Chronic atrial fibrillation, unspecified: Secondary | ICD-10-CM

## 2020-12-13 LAB — POCT INR: INR: 1.4 — AB (ref 2.0–3.0)

## 2020-12-13 LAB — COAGUCHEK XS/INR WAIVED
INR: 1.4 — ABNORMAL HIGH (ref 0.9–1.1)
Prothrombin Time: 17.1 s
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
## 2020-12-18 ENCOUNTER — Ambulatory Visit: Payer: Medicare PPO | Admitting: Family Medicine

## 2020-12-18 DIAGNOSIS — I152 Hypertension secondary to endocrine disorders: Secondary | ICD-10-CM | POA: Insufficient documentation

## 2020-12-18 DIAGNOSIS — E1159 Type 2 diabetes mellitus with other circulatory complications: Secondary | ICD-10-CM | POA: Insufficient documentation

## 2020-12-19 ENCOUNTER — Encounter: Payer: Self-pay | Admitting: Family Medicine

## 2020-12-20 ENCOUNTER — Encounter: Payer: Self-pay | Admitting: Family Medicine

## 2020-12-20 ENCOUNTER — Ambulatory Visit: Payer: Medicare PPO | Admitting: Family Medicine

## 2020-12-20 ENCOUNTER — Other Ambulatory Visit: Payer: Self-pay

## 2020-12-20 VITALS — BP 148/70 | HR 80 | Temp 98.2°F | Ht 72.0 in | Wt 161.0 lb

## 2020-12-20 DIAGNOSIS — Z794 Long term (current) use of insulin: Secondary | ICD-10-CM

## 2020-12-20 DIAGNOSIS — D6859 Other primary thrombophilia: Secondary | ICD-10-CM | POA: Diagnosis not present

## 2020-12-20 DIAGNOSIS — E785 Hyperlipidemia, unspecified: Secondary | ICD-10-CM

## 2020-12-20 DIAGNOSIS — E119 Type 2 diabetes mellitus without complications: Secondary | ICD-10-CM | POA: Diagnosis not present

## 2020-12-20 DIAGNOSIS — J61 Pneumoconiosis due to asbestos and other mineral fibers: Secondary | ICD-10-CM | POA: Diagnosis not present

## 2020-12-20 DIAGNOSIS — Z125 Encounter for screening for malignant neoplasm of prostate: Secondary | ICD-10-CM

## 2020-12-20 DIAGNOSIS — E1141 Type 2 diabetes mellitus with diabetic mononeuropathy: Secondary | ICD-10-CM

## 2020-12-20 DIAGNOSIS — E1165 Type 2 diabetes mellitus with hyperglycemia: Secondary | ICD-10-CM | POA: Diagnosis not present

## 2020-12-20 DIAGNOSIS — E1140 Type 2 diabetes mellitus with diabetic neuropathy, unspecified: Secondary | ICD-10-CM | POA: Insufficient documentation

## 2020-12-20 DIAGNOSIS — I482 Chronic atrial fibrillation, unspecified: Secondary | ICD-10-CM | POA: Diagnosis not present

## 2020-12-20 DIAGNOSIS — I152 Hypertension secondary to endocrine disorders: Secondary | ICD-10-CM | POA: Diagnosis not present

## 2020-12-20 DIAGNOSIS — C439 Malignant melanoma of skin, unspecified: Secondary | ICD-10-CM | POA: Diagnosis not present

## 2020-12-20 DIAGNOSIS — E1169 Type 2 diabetes mellitus with other specified complication: Secondary | ICD-10-CM | POA: Diagnosis not present

## 2020-12-20 DIAGNOSIS — E1159 Type 2 diabetes mellitus with other circulatory complications: Secondary | ICD-10-CM | POA: Diagnosis not present

## 2020-12-20 LAB — COAGUCHEK XS/INR WAIVED
INR: 2.1 — ABNORMAL HIGH (ref 0.9–1.1)
Prothrombin Time: 25.5 s

## 2020-12-20 LAB — POCT INR: INR: 2.1 (ref 2.0–3.0)

## 2020-12-20 LAB — BAYER DCA HB A1C WAIVED: HB A1C (BAYER DCA - WAIVED): 7.1 % — ABNORMAL HIGH (ref 4.8–5.6)
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
## 2020-12-21 LAB — THYROID PANEL WITH TSH
Free Thyroxine Index: 2 (ref 1.2–4.9)
T3 Uptake Ratio: 27 % (ref 24–39)
T4, Total: 7.4 ug/dL (ref 4.5–12.0)
TSH: 2.15 u[IU]/mL (ref 0.450–4.500)

## 2020-12-21 LAB — MICROALBUMIN / CREATININE URINE RATIO
Creatinine, Urine: 175.9 mg/dL
Microalb/Creat Ratio: 12 mg/g creat (ref 0–29)
Microalbumin, Urine: 20.8 ug/mL

## 2020-12-21 LAB — CMP14+EGFR
ALT: 26 IU/L (ref 0–44)
AST: 31 IU/L (ref 0–40)
Albumin/Globulin Ratio: 2.4 — ABNORMAL HIGH (ref 1.2–2.2)
Albumin: 4.7 g/dL (ref 3.8–4.8)
Alkaline Phosphatase: 72 IU/L (ref 44–121)
BUN/Creatinine Ratio: 18 (ref 10–24)
BUN: 19 mg/dL (ref 8–27)
Bilirubin Total: 0.5 mg/dL (ref 0.0–1.2)
CO2: 25 mmol/L (ref 20–29)
Calcium: 9.7 mg/dL (ref 8.6–10.2)
Chloride: 102 mmol/L (ref 96–106)
Creatinine, Ser: 1.05 mg/dL (ref 0.76–1.27)
Globulin, Total: 2 g/dL (ref 1.5–4.5)
Glucose: 78 mg/dL (ref 70–99)
Potassium: 4.6 mmol/L (ref 3.5–5.2)
Sodium: 142 mmol/L (ref 134–144)
Total Protein: 6.7 g/dL (ref 6.0–8.5)
eGFR: 77 mL/min/{1.73_m2} (ref >59)

## 2020-12-21 LAB — CBC WITH DIFFERENTIAL/PLATELET
Basophils Absolute: 0 10*3/uL (ref 0.0–0.2)
Basos: 1 %
EOS (ABSOLUTE): 0 10*3/uL (ref 0.0–0.4)
Eos: 1 %
Hematocrit: 43.8 % (ref 37.5–51.0)
Hemoglobin: 14.6 g/dL (ref 13.0–17.7)
Immature Grans (Abs): 0 10*3/uL (ref 0.0–0.1)
Immature Granulocytes: 0 %
Lymphocytes Absolute: 2 10*3/uL (ref 0.7–3.1)
Lymphs: 31 %
MCH: 31.7 pg (ref 26.6–33.0)
MCHC: 33.3 g/dL (ref 31.5–35.7)
MCV: 95 fL (ref 79–97)
Monocytes Absolute: 0.4 10*3/uL (ref 0.1–0.9)
Monocytes: 6 %
Neutrophils Absolute: 4 10*3/uL (ref 1.4–7.0)
Neutrophils: 61 %
Platelets: 183 10*3/uL (ref 150–450)
RBC: 4.6 x10E6/uL (ref 4.14–5.80)
RDW: 12.8 % (ref 11.6–15.4)
WBC: 6.5 10*3/uL (ref 3.4–10.8)

## 2020-12-21 LAB — LIPID PANEL
Chol/HDL Ratio: 2.4 ratio (ref 0.0–5.0)
Cholesterol, Total: 151 mg/dL (ref 100–199)
HDL: 64 mg/dL (ref >39)
LDL Chol Calc (NIH): 77 mg/dL (ref 0–99)
Triglycerides: 46 mg/dL (ref 0–149)
VLDL Cholesterol Cal: 10 mg/dL (ref 5–40)

## 2021-01-01 DIAGNOSIS — G4733 Obstructive sleep apnea (adult) (pediatric): Secondary | ICD-10-CM | POA: Diagnosis not present

## 2021-01-16 DIAGNOSIS — Z23 Encounter for immunization: Secondary | ICD-10-CM | POA: Diagnosis not present

## 2021-01-17 ENCOUNTER — Encounter: Payer: Self-pay | Admitting: Family Medicine

## 2021-01-17 ENCOUNTER — Ambulatory Visit (INDEPENDENT_AMBULATORY_CARE_PROVIDER_SITE_OTHER): Payer: Medicare PPO | Admitting: Family Medicine

## 2021-01-17 ENCOUNTER — Ambulatory Visit: Payer: Medicare PPO | Admitting: Family Medicine

## 2021-01-17 VITALS — BP 142/73 | HR 48 | Ht 72.0 in | Wt 166.0 lb

## 2021-01-17 DIAGNOSIS — Z86718 Personal history of other venous thrombosis and embolism: Secondary | ICD-10-CM | POA: Diagnosis not present

## 2021-01-17 DIAGNOSIS — I482 Chronic atrial fibrillation, unspecified: Secondary | ICD-10-CM

## 2021-01-17 DIAGNOSIS — D6859 Other primary thrombophilia: Secondary | ICD-10-CM

## 2021-01-17 LAB — COAGUCHEK XS/INR WAIVED
INR: 2.5 — ABNORMAL HIGH (ref 0.9–1.1)
Prothrombin Time: 30.3 s

## 2021-01-17 NOTE — Progress Notes (Signed)
BP (!) 142/73    Pulse (!) 48    Ht 6' (1.829 m)    Wt 166 lb (75.3 kg)    SpO2 99%    BMI 22.51 kg/m    Subjective:   [name]: Christopher Avery, male    DOB: 1950-06-13, 70 y.o.   MRN: [id number]  HPI: Christopher Avery is a 70 y.o. male presenting on 01/17/2021 for Medical Management of Chronic Issues and Atrial Fibrillation   HPI Coumadin recheck Target goal: 2.0-3.0 Reason on anticoagulation: Chronic A. fib Patient denies any bruising or bleeding or chest pain or palpitations   Relevant past medical, surgical, family and social history reviewed and updated as indicated. Interim medical history since our last visit reviewed. Allergies and medications reviewed and updated.  Review of Systems  Constitutional:  Negative for chills and fever.  Respiratory:  Negative for shortness of breath and wheezing.   Cardiovascular:  Negative for chest pain and leg swelling.  Gastrointestinal:  Negative for blood in stool.  Genitourinary:  Negative for hematuria.  All other systems reviewed and are negative.  Per HPI unless specifically indicated above   Allergies as of 01/17/2021       Reactions   Januvia [sitagliptin] Hives        Medication List        Accurate as of January 17, 2021  2:30 PM. If you have any questions, ask your nurse or doctor.          atorvastatin 20 MG tablet Commonly known as: LIPITOR TAKE 1 TABLET BY MOUTH EVERY DAY   BD Pen Needle Nano 2nd Gen 32G X 4 MM Misc Generic drug: Insulin Pen Needle USE WITH INSULIN 4 TIMES A DAY DX E11.9   co-enzyme Q-10 30 MG capsule Take 100 mg by mouth daily.   diltiazem 240 MG 24 hr capsule Commonly known as: CARDIZEM CD TAKE 1 CAPSULE BY MOUTH EVERY DAY   fluticasone 50 MCG/ACT nasal spray Commonly known as: FLONASE SPRAY 2 SPRAYS INTO EACH NOSTRIL EVERY DAY   Lantus SoloStar 100 UNIT/ML Solostar Pen Generic drug: insulin glargine 19u   NovoLOG FlexPen 100 UNIT/ML FlexPen Generic drug: insulin  aspart Sliding scale   pregabalin 50 MG capsule Commonly known as: LYRICA 1 capsule   sildenafil 20 MG tablet Commonly known as: REVATIO TAKE 2-5 TABLETS AS NEEDED PRIOR TO SEXUAL ACTIVITY   valACYclovir 500 MG tablet Commonly known as: VALTREX TAKE 4 TABLETS BY MOUTH TWICE DAILY AS DIRECTED AND AS NEEDED   Vitamin D3 125 MCG (5000 UT) Caps Take 1 capsule by mouth daily. Takes 6 days a week   warfarin 2.5 MG tablet Commonly known as: COUMADIN Take as directed by the anticoagulation clinic. If you are unsure how to take this medication, talk to your nurse or doctor. Original instructions: TAKE 1 TO 1 AND 1/2 TABLETS DAILY AS DIRECTED         Objective:   BP (!) 142/73    Pulse (!) 48    Ht 6' (1.829 m)    Wt 166 lb (75.3 kg)    SpO2 99%    BMI 22.51 kg/m   Wt Readings from Last 3 Encounters:  01/17/21 166 lb (75.3 kg)  12/20/20 161 lb (73 kg)  12/13/20 161 lb (73 kg)    Physical Exam Vitals and nursing note reviewed.  Constitutional:      Appearance: Normal appearance.  Skin:    General: Skin is warm and dry.  Findings: No bruising (No major bruising).  Neurological:     Mental Status: He is alert.    Description   INR 2.5 (goal 2.0-3.0) diagnosis chronic A. fib Continue to take 1-1/2 tablets or 3.75 mg on Fridays and take 1 tablet or 2.5 mg the rest of the days Repeat in 4 weeks.       Assessment & Plan:   Problem List Items Addressed This Visit       Cardiovascular and Mediastinum   Chronic atrial fibrillation ([place]) - Primary   Relevant Orders   CoaguChek XS/INR Waived     Hematopoietic and Hemostatic   Primary hypercoagulable state ([place]) [D68.59]     Other   History of DVT of lower extremity     Follow up plan: Return if symptoms worsen or fail to improve, for 4-week INR with PCP.  Counseling provided for all of the vaccine components Orders Placed This Encounter  Procedures   CoaguChek XS/INR [place] Jerrika Ledlow,  MD [facility] 01/17/2021, 2:30 PM

## 2021-01-21 DIAGNOSIS — G4733 Obstructive sleep apnea (adult) (pediatric): Secondary | ICD-10-CM | POA: Diagnosis not present

## 2021-02-01 DIAGNOSIS — G4733 Obstructive sleep apnea (adult) (pediatric): Secondary | ICD-10-CM | POA: Diagnosis not present

## 2021-02-11 ENCOUNTER — Telehealth: Payer: Self-pay | Admitting: Family Medicine

## 2021-02-11 NOTE — Telephone Encounter (Signed)
Pt states he is going on a cruise next week and would like rx for patches for sea sickness sent in. Does he ntbs or can you send in rx? Pt aware you are out of office till tomorrow.

## 2021-02-11 NOTE — Telephone Encounter (Signed)
°  Prescription Request  02/11/2021  Is this a "Controlled Substance" medicine? NO  Have you seen your PCP in the last 2 weeks? 01/17/21  If YES, route message to pool  -  If NO, patient needs to be scheduled for appointment.  What is the name of the medication or equipment? PT needs patch for sea sickness  Have you contacted your pharmacy to request a refill? no   Which pharmacy would you [facility] this sent to? [facility]   Patient notified that their request is being sent to the clinical staff for review and that they should receive a response within 2 business days.

## 2021-02-12 ENCOUNTER — Other Ambulatory Visit: Payer: Self-pay | Admitting: Family Medicine

## 2021-02-12 DIAGNOSIS — T753XXA Motion sickness, initial encounter: Secondary | ICD-10-CM

## 2021-02-12 MED ORDER — SCOPOLAMINE 1 MG/3DAYS TD PT72: 1.0000 | MEDICATED_PATCH | TRANSDERMAL | 12 refills | Status: DC

## 2021-02-12 NOTE — Telephone Encounter (Signed)
Pt aware rx sent in and to not touch his eye after touching the patch due to pupil dilation.

## 2021-02-15 ENCOUNTER — Encounter: Payer: Self-pay | Admitting: Family Medicine

## 2021-02-15 ENCOUNTER — Ambulatory Visit (INDEPENDENT_AMBULATORY_CARE_PROVIDER_SITE_OTHER): Payer: Medicare PPO | Admitting: Family Medicine

## 2021-02-15 VITALS — BP 149/87 | HR 57 | Temp 98.7°F | Ht 72.0 in | Wt 167.0 lb

## 2021-02-15 DIAGNOSIS — I482 Chronic atrial fibrillation, unspecified: Secondary | ICD-10-CM

## 2021-02-15 DIAGNOSIS — Z7901 Long term (current) use of anticoagulants: Secondary | ICD-10-CM

## 2021-02-15 DIAGNOSIS — D6859 Other primary thrombophilia: Secondary | ICD-10-CM | POA: Diagnosis not present

## 2021-02-15 DIAGNOSIS — Z86718 Personal history of other venous thrombosis and embolism: Secondary | ICD-10-CM

## 2021-02-15 LAB — COAGUCHEK XS/INR WAIVED
INR: 1.8 — ABNORMAL HIGH (ref 0.9–1.1)
Prothrombin Time: 22 s

## 2021-02-15 LAB — POCT INR: INR: 1.8 — AB (ref 2.0–3.0)

## 2021-02-15 MED ORDER — OSELTAMIVIR PHOSPHATE 75 MG PO CAPS: 75.0000 mg | ORAL_CAPSULE | Freq: Every day | ORAL | 0 refills | Status: AC

## 2021-02-15 MED ORDER — AMOXICILLIN 875 MG PO TABS: 875.0000 mg | ORAL_TABLET | Freq: Two times a day (BID) | ORAL | 0 refills | Status: DC
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
## 2021-03-05 DIAGNOSIS — G4733 Obstructive sleep apnea (adult) (pediatric): Secondary | ICD-10-CM | POA: Diagnosis not present

## 2021-03-08 ENCOUNTER — Telehealth: Payer: Self-pay | Admitting: Cardiovascular Disease

## 2021-03-08 ENCOUNTER — Ambulatory Visit: Payer: Medicare PPO | Admitting: Nurse Practitioner

## 2021-03-08 ENCOUNTER — Encounter: Payer: Self-pay | Admitting: Nurse Practitioner

## 2021-03-08 DIAGNOSIS — U071 COVID-19: Secondary | ICD-10-CM

## 2021-03-08 MED ORDER — NIRMATRELVIR/RITONAVIR (PAXLOVID)TABLET: 3.0000 | ORAL_TABLET | Freq: Two times a day (BID) | ORAL | 0 refills | Status: AC

## 2021-03-08 NOTE — Telephone Encounter (Signed)
Patient should hold atorvastatin and sildenafil while on Paxlovid.  Use of Paxlovid and diltiazem together can increase risk of hypotension.

## 2021-03-08 NOTE — Progress Notes (Signed)
Virtual Visit  Note Due to COVID-19 pandemic this visit was conducted virtually. This visit type was conducted due to national recommendations for restrictions regarding the COVID-19 Pandemic (e.g. social distancing, sheltering in place) in an effort to limit this patient's exposure and mitigate transmission in our community. All issues noted in this document were discussed and addressed.  A physical exam was not performed with this format.  I connected with Christopher Avery on 03/08/21 at 9:00 by telephone and verified that I am speaking with the correct person using two identifiers. Christopher Avery is currently located at home and his wife, Christopher Avery,  is currently with him during visit. The provider, Mary-Margaret Hassell Done, FNP is located in their office at time of visit.  I discussed the limitations, risks, security and privacy concerns of performing an evaluation and management service by telephone and the availability of in person appointments. I also discussed with the patient that there may be a patient responsible charge related to this service. The patient expressed understanding and agreed to proceed.   History and Present Illness:  URI  The current episode started in the past 7 days. The problem has been unchanged. There has been no fever. Associated symptoms include congestion, coughing, rhinorrhea, sneezing and a sore throat. Pertinent negatives include no headaches. He has tried acetaminophen (he took some amoxicillin that he had left at home.) for the symptoms. The treatment provided mild relief. Tested positive for covid last night.    Review of Systems  HENT:  Positive for congestion, rhinorrhea, sneezing and sore throat.   Respiratory:  Positive for cough.   Neurological:  Negative for headaches.    Observations/Objective: Alert and oriented- answers all questions appropriately No distress [place] voice  Assessment and Plan: Christopher Avery in today with chief complaint of  Covid Positive   1. Positive self-administered antigen test for COVID-19 1. Take meds as prescribed 2. Use a cool mist humidifier especially during the winter months and when heat has been humid. 3. Use saline nose sprays frequently 4. Saline irrigations of the nose can be very helpful if done frequently.  * 4X daily for 1 week*  * Use of a nettie pot can be helpful with this. Follow directions with this* 5. Drink plenty of fluids 6. Keep thermostat turn down low 7.For any cough or congestion- delsym  8. For fever or aces or pains- take tylenol or ibuprofen appropriate for age and weight.  * for fevers greater than 101 orally you may alternate ibuprofen and tylenol every  3 hours.   Meds ordered this encounter  Medications   nirmatrelvir/ritonavir EUA (PAXLOVID) 20 x 150 MG & 10 x 100MG  TABS    Sig: Take 3 tablets by mouth 2 (two) times daily for 5 days. (Take nirmatrelvir 150 mg two tablets twice daily for 5 days and ritonavir 100 mg one tablet twice daily for 5 days) Patient GFR is 77    Dispense:  30 tablet    Refill:  0    Order Specific Question:   Supervising Provider    Answer:   Caryl Pina A [[id number]]       Follow Up Instructions: prn    I discussed the assessment and treatment plan with the patient. The patient was provided an opportunity to ask questions and all were answered. The patient agreed with the plan and demonstrated an understanding of the instructions.   The patient was advised to call back or seek an in-person evaluation if  the symptoms worsen or if the condition fails to improve as anticipated.  The above assessment and management plan was discussed with the patient. The patient verbalized understanding of and has agreed to the management plan. Patient is aware to call the clinic if symptoms persist or worsen. Patient is aware when to return to the clinic for a follow-up visit. Patient educated on when it is appropriate to go to the emergency  department.   Time call ended:  9:18  I provided 14 minutes of  non face-to-face time during this encounter.    Mary-Margaret Hassell Done, FNP

## 2021-03-08 NOTE — Telephone Encounter (Signed)
Spoke to patient Pharm D's advice given. 

## 2021-03-08 NOTE — Telephone Encounter (Signed)
Pt c/o medication issue:  1. Name of Medication: diltiazem (CARDIZEM CD) 240 MG 24 hr capsule  2. How are you currently taking this medication (dosage and times per day)? TAKE 1 CAPSULE BY MOUTH EVERY DAY  3. Are you having a reaction (difficulty breathing--STAT)? no  4. What is your medication issue? Patient has covid and they prescribe him paxlobid. Patient calling in to see if it would be okay to take with diltiazem. Please advise .

## 2021-03-08 NOTE — Telephone Encounter (Signed)
Spoke to patient stated he has covid and was prescribed Paxlovid.He wanted to make sure ok to take with Diltiazem.Advised I will send message to our pharmacist.

## 2021-03-08 NOTE — Patient Instructions (Signed)

## 2021-03-11 DIAGNOSIS — E1165 Type 2 diabetes mellitus with hyperglycemia: Secondary | ICD-10-CM | POA: Diagnosis not present

## 2021-04-04 DIAGNOSIS — Z85820 Personal history of malignant melanoma of skin: Secondary | ICD-10-CM | POA: Diagnosis not present

## 2021-04-04 DIAGNOSIS — Z85828 Personal history of other malignant neoplasm of skin: Secondary | ICD-10-CM | POA: Diagnosis not present

## 2021-04-04 DIAGNOSIS — X32XXXD Exposure to sunlight, subsequent encounter: Secondary | ICD-10-CM | POA: Diagnosis not present

## 2021-04-04 DIAGNOSIS — B070 Plantar wart: Secondary | ICD-10-CM | POA: Diagnosis not present

## 2021-04-04 DIAGNOSIS — Z08 Encounter for follow-up examination after completed treatment for malignant neoplasm: Secondary | ICD-10-CM | POA: Diagnosis not present

## 2021-04-04 DIAGNOSIS — D0471 Carcinoma in situ of skin of right lower limb, including hip: Secondary | ICD-10-CM | POA: Diagnosis not present

## 2021-04-04 DIAGNOSIS — Z1283 Encounter for screening for malignant neoplasm of skin: Secondary | ICD-10-CM | POA: Diagnosis not present

## 2021-04-04 DIAGNOSIS — L570 Actinic keratosis: Secondary | ICD-10-CM | POA: Diagnosis not present

## 2021-04-06 DIAGNOSIS — E785 Hyperlipidemia, unspecified: Secondary | ICD-10-CM | POA: Diagnosis not present

## 2021-04-06 DIAGNOSIS — I4891 Unspecified atrial fibrillation: Secondary | ICD-10-CM | POA: Diagnosis not present

## 2021-04-06 DIAGNOSIS — D6869 Other thrombophilia: Secondary | ICD-10-CM | POA: Diagnosis not present

## 2021-04-06 DIAGNOSIS — I951 Orthostatic hypotension: Secondary | ICD-10-CM | POA: Diagnosis not present

## 2021-04-06 DIAGNOSIS — Z794 Long term (current) use of insulin: Secondary | ICD-10-CM | POA: Diagnosis not present

## 2021-04-06 DIAGNOSIS — I10 Essential (primary) hypertension: Secondary | ICD-10-CM | POA: Diagnosis not present

## 2021-04-06 DIAGNOSIS — N529 Male erectile dysfunction, unspecified: Secondary | ICD-10-CM | POA: Diagnosis not present

## 2021-04-06 DIAGNOSIS — E1165 Type 2 diabetes mellitus with hyperglycemia: Secondary | ICD-10-CM | POA: Diagnosis not present

## 2021-04-06 DIAGNOSIS — M1990 Unspecified osteoarthritis, unspecified site: Secondary | ICD-10-CM | POA: Diagnosis not present

## 2021-05-03 ENCOUNTER — Ambulatory Visit: Payer: Medicare PPO | Admitting: Family Medicine

## 2021-05-03 ENCOUNTER — Encounter: Payer: Self-pay | Admitting: Family Medicine

## 2021-05-03 VITALS — BP 139/89 | HR 76 | Temp 97.4°F | Ht 72.0 in | Wt 161.4 lb

## 2021-05-03 DIAGNOSIS — I482 Chronic atrial fibrillation, unspecified: Secondary | ICD-10-CM

## 2021-05-03 DIAGNOSIS — Z7901 Long term (current) use of anticoagulants: Secondary | ICD-10-CM | POA: Diagnosis not present

## 2021-05-03 DIAGNOSIS — Z86718 Personal history of other venous thrombosis and embolism: Secondary | ICD-10-CM | POA: Diagnosis not present

## 2021-05-03 DIAGNOSIS — D6859 Other primary thrombophilia: Secondary | ICD-10-CM

## 2021-05-03 LAB — COAGUCHEK XS/INR WAIVED
INR: 1.7 — ABNORMAL HIGH (ref 0.9–1.1)
Prothrombin Time: 20.9 s

## 2021-05-03 LAB — POCT INR: INR: 1.7 — AB (ref 2.0–3.0)
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
## 2021-05-15 ENCOUNTER — Ambulatory Visit (INDEPENDENT_AMBULATORY_CARE_PROVIDER_SITE_OTHER): Payer: Medicare PPO

## 2021-05-15 ENCOUNTER — Other Ambulatory Visit: Payer: Self-pay | Admitting: Family Medicine

## 2021-05-15 VITALS — Wt 161.0 lb

## 2021-05-15 DIAGNOSIS — Z599 Problem related to housing and economic circumstances, unspecified: Secondary | ICD-10-CM | POA: Diagnosis not present

## 2021-05-15 DIAGNOSIS — Z0000 Encounter for general adult medical examination without abnormal findings: Secondary | ICD-10-CM
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
## 2021-05-15 NOTE — Patient Instructions (Signed)
Christopher Avery , ?Thank you for taking time to come for your Medicare Wellness Visit. I appreciate your ongoing commitment to your health goals. Please review the following plan we discussed and let me know if I can assist you in the future.  ? ?Screening recommendations/referrals: ?Colonoscopy: Done 09/20/2018 - repeat in 3 years *due this August ?Recommended yearly ophthalmology/optometry visit for glaucoma screening and checkup ?Recommended yearly dental visit for hygiene and checkup ? ?Vaccinations: ?Influenza vaccine: Done 11/20/2020 - Repeat annually  ?Pneumococcal vaccine: Done 12/04/2012 & 04/30/2016 ?Tdap vaccine: Done 07/12/2014 ?Shingles vaccine: Due - Shingrix is 2 doses 2-6 months apart and over 90% effective     ?Covid-19: Done 03/01/2019, 03/29/2019, 11/29/2019, & 01/16/2021 ? ?Advanced directives: Advance directive discussed with you today. Even though you declined this today, please call our office should you change your mind, and we can give you the proper paperwork for you to fill out.  ? ?Conditions/risks identified: Keep up the great work staying active! Aim for 30 minutes of exercise or brisk walking, 6-8 glasses of water, and 5 servings of fruits and vegetables each day.  ? ?Next appointment: Follow up in one year for your annual wellness visit.  ? ?Preventive Care 38 Years and Older, Male ? ?Preventive care refers to lifestyle choices and visits with your health care provider that can promote health and wellness. ?What does preventive care include? ?A yearly physical exam. This is also called an annual well check. ?Dental exams once or twice a year. ?Routine eye exams. Ask your health care provider how often you should have your eyes checked. ?Personal lifestyle choices, including: ?Daily care of your teeth and gums. ?Regular physical activity. ?Eating a healthy diet. ?Avoiding tobacco and drug use. ?Limiting alcohol use. ?Practicing safe sex. ?Taking low doses of aspirin every day. ?Taking vitamin and  mineral supplements as recommended by your health care provider. ?What happens during an annual well check? ?The services and screenings done by your health care provider during your annual well check will depend on your age, overall health, lifestyle risk factors, and family history of disease. ?Counseling  ?Your health care provider may ask you questions about your: ?Alcohol use. ?Tobacco use. ?Drug use. ?Emotional well-being. ?Home and relationship well-being. ?Sexual activity. ?Eating habits. ?History of falls. ?Memory and ability to understand (cognition). ?Work and work Statistician. ?Screening  ?You may have the following tests or measurements: ?Height, weight, and BMI. ?Blood pressure. ?Lipid and cholesterol levels. These may be checked every 5 years, or more frequently if you are over 88 years old. ?Skin check. ?Lung cancer screening. You may have this screening every year starting at age 28 if you have a 30-pack-year history of smoking and currently smoke or have quit within the past 15 years. ?Fecal occult blood test (FOBT) of the stool. You may have this test every year starting at age 29. ?Flexible sigmoidoscopy or colonoscopy. You may have a sigmoidoscopy every 5 years or a colonoscopy every 10 years starting at age 69. ?Prostate cancer screening. Recommendations will vary depending on your family history and other risks. ?Hepatitis C blood test. ?Hepatitis B blood test. ?Sexually transmitted disease (STD) testing. ?Diabetes screening. This is done by checking your blood sugar (glucose) after you have not eaten for a while (fasting). You may have this done every 1-3 years. ?Abdominal aortic aneurysm (AAA) screening. You may need this if you are a current or former smoker. ?Osteoporosis. You may be screened starting at age 32 if you are at high  risk. ?Talk with your health care provider about your test results, treatment options, and if necessary, the need for more tests. ?Vaccines  ?Your health care  provider may recommend certain vaccines, such as: ?Influenza vaccine. This is recommended every year. ?Tetanus, diphtheria, and acellular pertussis (Tdap, Td) vaccine. You may need a Td booster every 10 years. ?Zoster vaccine. You may need this after age 41. ?Pneumococcal 13-valent conjugate (PCV13) vaccine. One dose is recommended after age 75. ?Pneumococcal polysaccharide (PPSV23) vaccine. One dose is recommended after age 23. ?Talk to your health care provider about which screenings and vaccines you need and how often you need them. ?This information is not intended to replace advice given to you by your health care provider. Make sure you discuss any questions you have with your health care provider. ?Document Released: 02/16/2015 Document Revised: 10/10/2015 Document Reviewed: 11/21/2014 ?Elsevier Interactive Patient Education ? 2017 [place]. ? ?Fall Prevention in the Home ?Falls can cause injuries. They can happen to people of all ages. There are many things you can do to make your home safe and to help prevent falls. ?What can I do on the outside of my home? ?Regularly fix the edges of walkways and driveways and fix any cracks. ?Remove anything that might make you trip as you walk through a door, such as a raised step or threshold. ?Trim any bushes or trees on the path to your home. ?Use bright outdoor lighting. ?Clear any walking paths of anything that might make someone trip, such as rocks or tools. ?Regularly check to see if handrails are loose or broken. Make sure that both sides of any steps have handrails. ?Any raised decks and porches should have guardrails on the edges. ?Have any leaves, snow, or ice cleared regularly. ?Use sand or salt on walking paths during winter. ?Clean up any spills in your garage right away. This includes oil or grease spills. ?What can I do in the bathroom? ?Use night lights. ?Install grab bars by the toilet and in the tub and shower. Do not use towel bars as grab  bars. ?Use non-skid mats or decals in the tub or shower. ?If you need to sit down in the shower, use a plastic, non-slip stool. ?Keep the floor dry. Clean up any water that spills on the floor as soon as it happens. ?Remove soap buildup in the tub or shower regularly. ?Attach bath mats securely with double-sided non-slip rug tape. ?Do not have throw rugs and other things on the floor that can make you trip. ?What can I do in the bedroom? ?Use night lights. ?Make sure that you have a light by your bed that is easy to reach. ?Do not use any sheets or blankets that are too big for your bed. They should not hang down onto the floor. ?Have a firm chair that has side arms. You can use this for support while you get dressed. ?Do not have throw rugs and other things on the floor that can make you trip. ?What can I do in the kitchen? ?Clean up any spills right away. ?Avoid walking on wet floors. ?Keep items that you use a lot in easy-to-reach places. ?If you need to reach something above you, use a strong step stool that has a grab bar. ?Keep electrical cords out of the way. ?Do not use floor polish or wax that makes floors slippery. If you must use wax, use non-skid floor wax. ?Do not have throw rugs and other things on the floor that can  make you trip. ?What can I do with my stairs? ?Do not leave any items on the stairs. ?Make sure that there are handrails on both sides of the stairs and use them. Fix handrails that are broken or loose. Make sure that handrails are as long as the stairways. ?Check any carpeting to make sure that it is firmly attached to the stairs. Fix any carpet that is loose or worn. ?Avoid having throw rugs at the top or bottom of the stairs. If you do have throw rugs, attach them to the floor with carpet tape. ?Make sure that you have a light switch at the top of the stairs and the bottom of the stairs. If you do not have them, ask someone to add them for you. ?What else can I do to help prevent  falls? ?Wear shoes that: ?Do not have high heels. ?Have rubber bottoms. ?Are comfortable and fit you well. ?Are closed at the toe. Do not wear sandals. ?If you use a stepladder: ?Make sure that it is fully opened. D

## 2021-05-16 DIAGNOSIS — G629 Polyneuropathy, unspecified: Secondary | ICD-10-CM | POA: Diagnosis not present

## 2021-05-16 DIAGNOSIS — E162 Hypoglycemia, unspecified: Secondary | ICD-10-CM | POA: Diagnosis not present

## 2021-05-16 DIAGNOSIS — E7800 Pure hypercholesterolemia, unspecified: Secondary | ICD-10-CM | POA: Diagnosis not present

## 2021-05-16 DIAGNOSIS — I10 Essential (primary) hypertension: Secondary | ICD-10-CM | POA: Diagnosis not present

## 2021-05-16 DIAGNOSIS — E1165 Type 2 diabetes mellitus with hyperglycemia: Secondary | ICD-10-CM | POA: Diagnosis not present

## 2021-05-17 ENCOUNTER — Encounter: Payer: Self-pay | Admitting: Family Medicine

## 2021-05-17 ENCOUNTER — Ambulatory Visit: Payer: Medicare PPO | Admitting: Family Medicine

## 2021-05-17 VITALS — BP 148/76 | HR 85 | Temp 97.4°F | Resp 20 | Ht 72.0 in | Wt 161.0 lb

## 2021-05-17 DIAGNOSIS — E119 Type 2 diabetes mellitus without complications: Secondary | ICD-10-CM

## 2021-05-17 DIAGNOSIS — Z7901 Long term (current) use of anticoagulants: Secondary | ICD-10-CM

## 2021-05-17 DIAGNOSIS — E1169 Type 2 diabetes mellitus with other specified complication: Secondary | ICD-10-CM

## 2021-05-17 DIAGNOSIS — D6859 Other primary thrombophilia: Secondary | ICD-10-CM | POA: Diagnosis not present

## 2021-05-17 DIAGNOSIS — I482 Chronic atrial fibrillation, unspecified: Secondary | ICD-10-CM | POA: Diagnosis not present

## 2021-05-17 DIAGNOSIS — E785 Hyperlipidemia, unspecified: Secondary | ICD-10-CM

## 2021-05-17 DIAGNOSIS — Z86718 Personal history of other venous thrombosis and embolism: Secondary | ICD-10-CM

## 2021-05-17 DIAGNOSIS — Z794 Long term (current) use of insulin: Secondary | ICD-10-CM | POA: Diagnosis not present

## 2021-05-17 DIAGNOSIS — Z1159 Encounter for screening for other viral diseases: Secondary | ICD-10-CM | POA: Diagnosis not present

## 2021-05-17 DIAGNOSIS — E1159 Type 2 diabetes mellitus with other circulatory complications: Secondary | ICD-10-CM

## 2021-05-17 DIAGNOSIS — I152 Hypertension secondary to endocrine disorders: Secondary | ICD-10-CM | POA: Diagnosis not present

## 2021-05-17 LAB — BAYER DCA HB A1C WAIVED: HB A1C (BAYER DCA - WAIVED): 7.1 % — ABNORMAL HIGH (ref 4.8–5.6)

## 2021-05-17 LAB — POCT INR: INR: 2.3 (ref 2.0–3.0)

## 2021-05-17 LAB — COAGUCHEK XS/INR WAIVED
INR: 2.3 — ABNORMAL HIGH (ref 0.9–1.1)
Prothrombin Time: 28 s
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
## 2021-05-18 LAB — LIPID PANEL
Chol/HDL Ratio: 2.5 ratio (ref 0.0–5.0)
Cholesterol, Total: 153 mg/dL (ref 100–199)
HDL: 61 mg/dL (ref >39)
LDL Chol Calc (NIH): 82 mg/dL (ref 0–99)
Triglycerides: 48 mg/dL (ref 0–149)
VLDL Cholesterol Cal: 10 mg/dL (ref 5–40)

## 2021-05-18 LAB — CBC WITH DIFFERENTIAL/PLATELET
Basophils Absolute: 0 10*3/uL (ref 0.0–0.2)
Basos: 1 %
EOS (ABSOLUTE): 0.1 10*3/uL (ref 0.0–0.4)
Eos: 2 %
Hematocrit: 42.4 % (ref 37.5–51.0)
Hemoglobin: 14.4 g/dL (ref 13.0–17.7)
Immature Grans (Abs): 0 10*3/uL (ref 0.0–0.1)
Immature Granulocytes: 0 %
Lymphocytes Absolute: 1.1 10*3/uL (ref 0.7–3.1)
Lymphs: 26 %
MCH: 31.4 pg (ref 26.6–33.0)
MCHC: 34 g/dL (ref 31.5–35.7)
MCV: 92 fL (ref 79–97)
Monocytes Absolute: 0.2 10*3/uL (ref 0.1–0.9)
Monocytes: 5 %
Neutrophils Absolute: 3 10*3/uL (ref 1.4–7.0)
Neutrophils: 66 %
Platelets: 146 10*3/uL — ABNORMAL LOW (ref 150–450)
RBC: 4.59 x10E6/uL (ref 4.14–5.80)
RDW: 12.8 % (ref 11.6–15.4)
WBC: 4.4 10*3/uL (ref 3.4–10.8)

## 2021-05-18 LAB — CMP14+EGFR
ALT: 21 IU/L (ref 0–44)
AST: 24 IU/L (ref 0–40)
Albumin/Globulin Ratio: 2.5 — ABNORMAL HIGH (ref 1.2–2.2)
Albumin: 4.5 g/dL (ref 3.8–4.8)
Alkaline Phosphatase: 67 IU/L (ref 44–121)
BUN/Creatinine Ratio: 21 (ref 10–24)
BUN: 23 mg/dL (ref 8–27)
Bilirubin Total: 0.7 mg/dL (ref 0.0–1.2)
CO2: 27 mmol/L (ref 20–29)
Calcium: 9.6 mg/dL (ref 8.6–10.2)
Chloride: 102 mmol/L (ref 96–106)
Creatinine, Ser: 1.07 mg/dL (ref 0.76–1.27)
Globulin, Total: 1.8 g/dL (ref 1.5–4.5)
Glucose: 214 mg/dL — ABNORMAL HIGH (ref 70–99)
Potassium: 4.8 mmol/L (ref 3.5–5.2)
Sodium: 140 mmol/L (ref 134–144)
Total Protein: 6.3 g/dL (ref 6.0–8.5)
eGFR: 75 mL/min/{1.73_m2} (ref >59)

## 2021-05-18 LAB — THYROID PANEL WITH TSH
Free Thyroxine Index: 2 (ref 1.2–4.9)
T3 Uptake Ratio: 28 % (ref 24–39)
T4, Total: 7 ug/dL (ref 4.5–12.0)
TSH: 2.51 u[IU]/mL (ref 0.450–4.500)

## 2021-05-18 LAB — HEPATITIS C ANTIBODY: Hep C Virus Ab: NONREACTIVE

## 2021-05-22 ENCOUNTER — Telehealth: Payer: Self-pay

## 2021-05-22 NOTE — Chronic Care Management (AMB) (Signed)
?  Care Management  ? ?Note ? ?05/22/2021 ?Name: Christopher Avery MRN: [id number] DOB: [birth year] ? ?[name] is a 71 y.o. year old male who is a primary care patient of Baruch Gouty, FNP. I reached out to [name] by phone today offer care coordination services.  ? ?[name] was given information about care management services today including:  ?Care management services include personalized support from designated clinical staff supervised by his physician, including individualized plan of care and coordination with other care providers ?24/7 contact phone numbers for assistance for urgent and routine care needs. ?The patient may stop care management services at any time by phone call to the office staff. ? ?Patient agreed to services and verbal consent obtained.  ? ?Follow up plan: ?Telephone appointment with care management team member scheduled for:06/13/2021 ? ?Noreene Larsson, RMA ?Care Guide, Embedded Care Coordination ?[facility]  ?[place], [place] [address] ?Direct Dial: [phone] ?Museum/gallery [email or web address].[email or web address] ?Website: [email or web address]  ? ?

## 2021-05-30 ENCOUNTER — Ambulatory Visit: Payer: Medicare PPO | Admitting: Cardiovascular Disease

## 2021-05-30 ENCOUNTER — Encounter: Payer: Self-pay | Admitting: Cardiovascular Disease

## 2021-05-30 VITALS — BP 126/72 | HR 65 | Ht 72.0 in | Wt 165.0 lb

## 2021-05-30 DIAGNOSIS — I482 Chronic atrial fibrillation, unspecified: Secondary | ICD-10-CM

## 2021-05-30 DIAGNOSIS — E7800 Pure hypercholesterolemia, unspecified: Secondary | ICD-10-CM

## 2021-05-30 DIAGNOSIS — I10 Essential (primary) hypertension: Secondary | ICD-10-CM

## 2021-05-30 NOTE — Patient Instructions (Signed)
Medication Instructions:  ?Your physician recommends that you continue on your current medications as directed. Please refer to the Current Medication list given to you today.  ?*If you need a refill on your cardiac medications before your next appointment, please call your pharmacy* ? ? ?Lab Work: ?None ?If you have labs (blood work) drawn today and your tests are completely normal, you will receive your results only by: ?MyChart Message (if you have MyChart) OR ?A paper copy in the mail ?If you have any lab test that is abnormal or we need to change your treatment, we will call you to review the results. ? ? ?Testing/Procedures: ?None ? ? ?Follow-Up: ?At [facility], you and your health needs are our priority.  As part of our continuing mission to provide you with exceptional heart care, we have created designated Provider Care Teams.  These Care Teams include your primary Cardiologist (physician) and Advanced Practice Providers (APPs -  Physician Assistants and Nurse Practitioners) who all work together to provide you with the care you need, when you need it. ? ?We recommend signing up for the patient portal called "MyChart".  Sign up information is provided on this After Visit Summary.  MyChart is used to connect with patients for Virtual Visits (Telemedicine).  Patients are able to view lab/test results, encounter notes, upcoming appointments, etc.  Non-urgent messages can be sent to your provider as well.   ?To learn more about what you can do with MyChart, go to [email or web address].   ? ?Your next appointment:   ?1 year(s) ? ?The format for your next appointment:   ?In Person ? ?Provider:   ?Jonathan Berry, MD   ? ? ?Other Instructions ? ? ?Important Information About Sugar ? ? ? ? ?  ?

## 2021-05-30 NOTE — Assessment & Plan Note (Signed)
History of essential hypertension a blood pressure measured today at 154/86 falling to 126/72 at the end of his visit.  He is on diltiazem. ?

## 2021-05-30 NOTE — Assessment & Plan Note (Signed)
History of chronic A-fib rate controlled on Coumadin anticoagulation.  His INRs are managed by his PCP. ?

## 2021-05-30 NOTE — Assessment & Plan Note (Signed)
History of hyperlipidemia on statin therapy with lipid profile performed 05/17/2021 revealing total cholesterol 153, LDL 82 and HDL 61. ?

## 2021-05-30 NOTE — Progress Notes (Signed)
? ? ? ?05/30/2021 ?[name]   ?02-25-1950  ?[id number] ? ?Primary Physician Baruch Gouty, FNP ?Primary Cardiologist: Lorretta Harp MD Lupe Carney, [place] ? ?HPI:  Christopher Avery is a 71 y.o.  thin and fit-appearing married Caucasian male, father of 48, grandfather of 4 grandchildren, who I saw 05/04/2020.[name] His mother-in-law, Darnelle Bos, was a patient of mine for a long time and died Jun 24, 2016.  He is accompanied by his wife [name] today, daughter of Darnelle Bos. He has a history of paroxysmal atrial fibrillation, maintaining sinus rhythm on Coumadin anticoagulation  His other problems include hypertension, hyperlipidemia, and family history of heart disease.  ?  ?Since I saw him a year ago he continues to do well.  He remains in chronic A-fib rate controlled on Coumadin anticoagulation.  He denies chest pain or shortness of breath. ? ? ?Current Meds  ?Medication Sig  ? atorvastatin (LIPITOR) 20 MG tablet TAKE 1 TABLET BY MOUTH EVERY DAY  ? BD PEN NEEDLE NANO 2ND GEN 32G X 4 MM MISC USE WITH INSULIN 4 TIMES A DAY DX E11.9  ? Cholecalciferol (VITAMIN D3) 125 MCG (5000 UT) CAPS Take 1 capsule by mouth daily. Takes 6 days a week  ? co-enzyme Q-10 30 MG capsule Take 100 mg by mouth daily.  ? diltiazem (CARDIZEM CD) 240 MG 24 hr capsule TAKE 1 CAPSULE BY MOUTH EVERY DAY  ? fluticasone (FLONASE) 50 MCG/ACT nasal spray SPRAY 2 SPRAYS INTO EACH NOSTRIL EVERY DAY (Patient taking differently: as needed.)  ? Insulin Disposable Pump (OMNIPOD 5 G6 POD, GEN 5,) MISC Inject into the skin.  ? insulin glargine (LANTUS SOLOSTAR) 100 UNIT/ML Solostar Pen 19u  ? NOVOLOG FLEXPEN 100 UNIT/ML FlexPen Sliding scale  ? pregabalin (LYRICA) 50 MG capsule 1 capsule  ? sildenafil (REVATIO) 20 MG tablet TAKE 2-5 TABLETS AS NEEDED PRIOR TO SEXUAL ACTIVITY  ? valACYclovir (VALTREX) 500 MG tablet TAKE 4 TABLETS BY MOUTH TWICE DAILY AS DIRECTED AND AS NEEDED  ? warfarin (COUMADIN) 2.5 MG tablet TAKE 1 TO 1 AND 1/2 TABLETS  DAILY AS DIRECTED  ?  ? ?Allergies  ?Allergen Reactions  ? Januvia [Sitagliptin] Hives  ? ? ?Social History  ? ?Socioeconomic History  ? Marital status: Married  ?  Spouse name: [name]  ? Number of children: 2  ? Years of education: 14 years  ? Highest education level: Some college, no degree  ?Occupational History  ? Occupation: Retired  ?  Employer: [employer]  ?Tobacco Use  ? Smoking status: Never  ? Smokeless tobacco: Never  ?Vaping Use  ? Vaping Use: Never used  ?Substance and Sexual Activity  ? Alcohol use: Never  ?  Alcohol/week: 0.0 standard drinks  ? Drug use: No  ? Sexual activity: Yes  ?Other Topics Concern  ? Not on file  ?Social History Narrative  ? Lives with wife.  ? ?Social Determinants of Health  ? ?Financial Resource Strain: Low Risk   ? Difficulty of Paying Living Expenses: Not hard at all  ?Food Insecurity: No Food Insecurity  ? Worried About Charity fundraiser in the Last Year: Never true  ? Ran Out of Food in the Last Year: Never true  ?Transportation Needs: No Transportation Needs  ? Lack of Transportation (Medical): No  ? Lack of Transportation (Non-Medical): No  ?Physical Activity: Sufficiently Active  ? Days of Exercise per Week: 7 days  ? Minutes of Exercise per Session: 30 min  ?Stress: No Stress  Concern Present  ? Feeling of Stress : Not at all  ?Social Connections: Socially Integrated  ? Frequency of Communication with Friends and Family: More than three times a week  ? Frequency of Social Gatherings with Friends and Family: More than three times a week  ? Attends Religious Services: More than 4 times per year  ? Active Member of Clubs or Organizations: Yes  ? Attends Archivist Meetings: More than 4 times per year  ? Marital Status: Married  ?Intimate Partner Violence: Not At Risk  ? Fear of Current or Ex-Partner: No  ? Emotionally Abused: No  ? Physically Abused: No  ? Sexually Abused: No  ?  ? ?Review of Systems: ?General: negative for chills, fever, night sweats or weight  changes.  ?Cardiovascular: negative for chest pain, dyspnea on exertion, edema, orthopnea, palpitations, paroxysmal nocturnal dyspnea or shortness of breath ?Dermatological: negative for rash ?Respiratory: negative for cough or wheezing ?Urologic: negative for hematuria ?Abdominal: negative for nausea, vomiting, diarrhea, bright red blood per rectum, melena, or hematemesis ?Neurologic: negative for visual changes, syncope, or dizziness ?All other systems reviewed and are otherwise negative except as noted above. ? ? ? ?Blood pressure 126/72, pulse 65, height 6' (1.829 m), weight 165 lb (74.8 kg), SpO2 99 %.  ?General appearance: alert and no distress ?Neck: no adenopathy, no carotid bruit, no JVD, supple, symmetrical, trachea midline, and thyroid not enlarged, symmetric, no tenderness/mass/nodules ?Lungs: clear to auscultation bilaterally ?Heart: irregularly irregular rhythm ?Extremities: extremities normal, atraumatic, no cyanosis or edema ?Pulses: 2+ and symmetric ?Skin: Skin color, texture, turgor normal. No rashes or lesions ?Neurologic: Grossly normal ? ?EKG atrial fibrillation with ventricular response of 65 and occasional aberrantly conducted beats with septal Q waves.  I personally reviewed this EKG. ? ?ASSESSMENT AND PLAN:  ? ?Benign essential HTN ?History of essential hypertension a blood pressure measured today at 154/86 falling to 126/72 at the end of his visit.  He is on diltiazem. ? ?Chronic atrial fibrillation ([place]) ?History of chronic A-fib rate controlled on Coumadin anticoagulation.  His INRs are managed by his PCP. ? ?Pure hypercholesterolemia ?History of hyperlipidemia on statin therapy with lipid profile performed 05/17/2021 revealing total cholesterol 153, LDL 82 and HDL 61. ? ? ? ? ?Lorretta Harp MD FACP,FACC,FAHA, FSCAI ?05/30/2021 ?11:24 AM ?

## 2021-06-04 ENCOUNTER — Ambulatory Visit: Payer: Medicare PPO | Admitting: Cardiovascular Disease

## 2021-06-11 DIAGNOSIS — E1165 Type 2 diabetes mellitus with hyperglycemia: Secondary | ICD-10-CM | POA: Diagnosis not present

## 2021-06-13 ENCOUNTER — Ambulatory Visit: Payer: Medicare PPO | Admitting: Pharmacist

## 2021-06-13 DIAGNOSIS — I152 Hypertension secondary to endocrine disorders: Secondary | ICD-10-CM

## 2021-06-13 DIAGNOSIS — E119 Type 2 diabetes mellitus without complications: Secondary | ICD-10-CM

## 2021-06-13 NOTE — Progress Notes (Signed)
    06/13/2021 Name: DURWOOD DITTUS MRN: [id number] DOB: [birth year]   Referred by: Baruch Gouty, FNP Reason for referral : Chronic Care Management and Diabetes  [facility] patient assistance application submitted for Basaglar and Humalog. Medications will ship to patient's home in 3-4 weeks.  We switched from Lantus-->Basaglar (same insulin glargine) and from Novolog --> Humalog due to ease of patient assistance program.  Patient can call [facility] to follow up on approval and shipments    Follow Up Plan: Telephone follow up appointment with care management team member scheduled for:4 weeks      Regina Eck, PharmD, BCPS Clinical Pharmacist, [place]  II Phone [phone]

## 2021-06-16 ENCOUNTER — Other Ambulatory Visit: Payer: Self-pay | Admitting: Cardiovascular Disease

## 2021-06-17 ENCOUNTER — Other Ambulatory Visit: Payer: Self-pay | Admitting: Family Medicine

## 2021-06-27 ENCOUNTER — Ambulatory Visit: Payer: Medicare PPO | Admitting: Family Medicine

## 2021-06-27 ENCOUNTER — Encounter: Payer: Self-pay | Admitting: Family Medicine

## 2021-06-27 VITALS — BP 127/71 | HR 63 | Temp 98.6°F | Ht 72.0 in | Wt 163.0 lb

## 2021-06-27 DIAGNOSIS — Z86718 Personal history of other venous thrombosis and embolism: Secondary | ICD-10-CM

## 2021-06-27 DIAGNOSIS — D6859 Other primary thrombophilia: Secondary | ICD-10-CM

## 2021-06-27 DIAGNOSIS — I482 Chronic atrial fibrillation, unspecified: Secondary | ICD-10-CM

## 2021-06-27 DIAGNOSIS — Z7901 Long term (current) use of anticoagulants: Secondary | ICD-10-CM

## 2021-06-27 DIAGNOSIS — Z792 Long term (current) use of antibiotics: Secondary | ICD-10-CM | POA: Diagnosis not present

## 2021-06-27 LAB — COAGUCHEK XS/INR WAIVED
INR: 2.1 — ABNORMAL HIGH (ref 0.9–1.1)
Prothrombin Time: 25.2 s

## 2021-06-27 LAB — POCT INR: INR: 2.1 (ref 2.0–3.0)

## 2021-06-27 MED ORDER — AMOXICILLIN-POT CLAVULANATE 875-125 MG PO TABS: 1.0000 | ORAL_TABLET | Freq: Two times a day (BID) | ORAL | 0 refills | Status: AC
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
## 2021-07-02 DIAGNOSIS — G4733 Obstructive sleep apnea (adult) (pediatric): Secondary | ICD-10-CM | POA: Diagnosis not present

## 2021-07-04 ENCOUNTER — Telehealth: Payer: Self-pay | Admitting: Family Medicine

## 2021-07-04 MED ORDER — BASAGLAR KWIKPEN 100 UNIT/ML ~~LOC~~ SOPN: 19.0000 [IU] | PEN_INJECTOR | Freq: Every day | SUBCUTANEOUS | 11 refills | Status: DC

## 2021-07-04 MED ORDER — INSULIN LISPRO (1 UNIT DIAL) 100 UNIT/ML (KWIKPEN): 16.0000 [IU] | PEN_INJECTOR | Freq: Three times a day (TID) | SUBCUTANEOUS | 11 refills | Status: DC

## 2021-07-04 NOTE — Telephone Encounter (Signed)
Patient aware and verbalizes understanding. 

## 2021-07-04 NOTE — Telephone Encounter (Signed)
Please let patient know [facility] did not receive original application on 04/14/48 I re-submitted the forms today and got confirmation that they are reviewing We will let him know when approved for insulins They will ship to his home My pharmacy technician, Rosendo Gros, will reach out with more details as they come. Process can take up to 3-4 weeks (should be sooner)  Patient can call [phone] to check on his status and schedule shipments  Wait times are long, but medicine is free  Thanks!

## 2021-07-04 NOTE — Telephone Encounter (Signed)
Patient calling to talk to Almyra Free about the papers that he needs to come in to sign. Please call him back.

## 2021-07-16 ENCOUNTER — Ambulatory Visit: Payer: Medicare PPO | Admitting: Pharmacist

## 2021-07-18 ENCOUNTER — Telehealth: Payer: Self-pay

## 2021-07-18 DIAGNOSIS — Z794 Long term (current) use of insulin: Secondary | ICD-10-CM

## 2021-07-18 MED ORDER — HUMALOG KWIKPEN 200 UNIT/ML ~~LOC~~ SOPN: 16.0000 [IU] | PEN_INJECTOR | Freq: Three times a day (TID) | SUBCUTANEOUS | 6 refills | Status: DC

## 2021-07-24 NOTE — Telephone Encounter (Signed)
Received notification from [place] regarding approval for [place] [employer]. Patient assistance approved from 07/22/21 to 02/02/22.  MEDICATION WILL SHIP TO PT'S HOME FROM [facility] ([facility], SAME NPI AND [place] [address])

## 2021-07-26 ENCOUNTER — Other Ambulatory Visit: Payer: Self-pay | Admitting: Family Medicine

## 2021-07-26 MED ORDER — ATORVASTATIN CALCIUM 20 MG PO TABS: 20.0000 mg | ORAL_TABLET | Freq: Every day | ORAL | 0 refills | Status: DC

## 2021-08-01 ENCOUNTER — Ambulatory Visit (INDEPENDENT_AMBULATORY_CARE_PROVIDER_SITE_OTHER): Payer: Medicare PPO | Admitting: Pharmacist

## 2021-08-01 DIAGNOSIS — E1169 Type 2 diabetes mellitus with other specified complication: Secondary | ICD-10-CM

## 2021-08-01 DIAGNOSIS — E119 Type 2 diabetes mellitus without complications: Secondary | ICD-10-CM

## 2021-08-01 NOTE — Progress Notes (Signed)
Chronic Care Management Pharmacy Note  08/01/2021 Name:  Christopher Avery MRN:  [id number] DOB:  09/18/1950  Summary  Diabetes: New goal. Controlled--currently sees Endocrine (Dr. Chalmers Cater in [facility]) current treatment: lantus/novolog --> basaglar/humalog (same dosing as prescribed by endo), metformin Discussions regarding transtion to omnipod system Compatible with dexcom g6 technology  Ordered via advanced diabetes supply via [employer] Discussed with patient and wife that he may not qualify yet since he is already using libre 2 CGM technology as medicare only allows for 1 new cgm device per 5 years  Denies hypoglycemic/hyperglycemic symptoms Assessed patient finances. Patient approved for [employer] patient assistance program for basaglar, humalog (he has previously been on this combination of insulins before);  decision  to transition to basaglar/Humalog was made based on cost   Patient Goals/Self-Care Activities patient will:  - take medications as prescribed as evidenced by patient report and record review check glucose using CGM libre for now, document, and provide at future appointments collaborate with provider on medication access solutions target a minimum of 150 minutes of moderate intensity exercise weekly engage in dietary modifications by  FOLLOWING A HEART HEALTHY DIET/HEALTHY PLATE METHOD   Subjective: Christopher Avery is an 71 y.o. year old male who is a primary patient of Rakes, Connye Burkitt, FNP.  The CCM team was consulted for assistance with disease management and care coordination needs.    Engaged with patient by telephone for follow up visit in response to provider referral for pharmacy case management and/or care coordination services.   Consent to Services:  The patient was given information about Chronic Care Management services, agreed to services, and gave verbal consent prior to initiation of services.  Please see initial visit note for detailed  documentation.   Patient Care Team: Baruch Gouty, FNP as PCP - General (Family Medicine) Lorretta Harp, MD as PCP - Cardiology (Cardiology) Jacelyn Pi, MD as Consulting Physician (Endocrinology) Garald Balding, MD as Consulting Physician (Orthopedic Surgery) Lorretta Harp, MD as Consulting Physician (Cardiology) Tonia Ghent, AUD (Audiology) Freddi Starr, MD as Consulting Physician (Pulmonary Disease) Lavera Guise, [facility] as Pharmacist (Family Medicine)   Objective:  Lab Results  Component Value Date   CREATININE 1.07 05/17/2021   CREATININE 1.05 12/20/2020   CREATININE 1.00 03/21/2020    Lab Results  Component Value Date   HGBA1C 7.1 (H) 05/17/2021   Last diabetic Eye exam:  Lab Results  Component Value Date/Time   HMDIABEYEEXA No Retinopathy 07/19/2020 12:00 AM    Last diabetic Foot exam: No results found for: "HMDIABFOOTEX"      Component Value Date/Time   CHOL 153 05/17/2021 0839   TRIG 48 05/17/2021 0839   TRIG 65 05/05/2016 0810   HDL 61 05/17/2021 0839   HDL 51 05/05/2016 0810   CHOLHDL 2.5 05/17/2021 0839   LDLCALC 82 05/17/2021 0839   LDLCALC 84 07/15/2013 0812       Latest Ref Rng & Units 05/17/2021    8:39 AM 12/20/2020    2:03 PM 03/21/2020    8:06 AM  Hepatic Function  Total Protein 6.0 - 8.5 g/dL 6.3  6.7  6.4   Albumin 3.8 - 4.8 g/dL 4.5  4.7  4.3   AST 0 - 40 IU/L '24  31  24   ' ALT 0 - 44 IU/L '21  26  29   ' Alk Phosphatase 44 - 121 IU/L 67  72  65   Total Bilirubin 0.0 -  1.2 mg/dL 0.7  0.5  0.5     Lab Results  Component Value Date/Time   TSH 2.510 05/17/2021 08:39 AM   TSH 2.150 12/20/2020 02:03 PM       Latest Ref Rng & Units 05/17/2021    8:39 AM 12/20/2020    2:03 PM 03/21/2020    8:06 AM  CBC  WBC 3.4 - 10.8 x10E3/uL 4.4  6.5  5.0   Hemoglobin 13.0 - 17.7 g/dL 14.4  14.6  14.2   Hematocrit 37.5 - 51.0 % 42.4  43.8  42.0   Platelets 150 - 450 x10E3/uL 146  183  157     Lab Results  Component Value  Date/Time   VD25OH 79.6 06/29/2018 08:10 AM   VD25OH 62.0 12/25/2017 09:47 AM    Clinical ASCVD: No  The 10-year ASCVD risk score (Arnett DK, et al., 2019) is: 29.6%   Values used to calculate the score:     Age: 23 years     Sex: Male     Is Non-Hispanic African American: No     Diabetic: Yes     Tobacco smoker: No     Systolic Blood Pressure: 384 mmHg     Is BP treated: Yes     HDL Cholesterol: 61 mg/dL     Total Cholesterol: 153 mg/dL    Other: (CHADS2VASc if Afib, PHQ9 if depression, MMRC or CAT for COPD, ACT, DEXA)  Social History   Tobacco Use  Smoking Status Never  Smokeless Tobacco Never   BP Readings from Last 3 Encounters:  06/27/21 127/71  05/30/21 126/72  05/17/21 (!) 148/76   Pulse Readings from Last 3 Encounters:  06/27/21 63  05/30/21 65  05/17/21 85   Wt Readings from Last 3 Encounters:  06/27/21 163 lb (73.9 kg)  05/30/21 165 lb (74.8 kg)  05/17/21 161 lb (73 kg)    Assessment: Review of patient past medical history, allergies, medications, health status, including review of consultants reports, laboratory and other test data, was performed as part of comprehensive evaluation and provision of chronic care management services.   SDOH:  (Social Determinants of Health) assessments and interventions performed:    CCM Care Plan  Allergies  Allergen Reactions   Januvia [Sitagliptin] Hives    Medications Reviewed Today     Reviewed by Lavera Guise, [facility] (Pharmacist) on 08/02/21 at [phone]  Med List Status: <None>   Medication Order Taking? Sig Documenting Provider Last Dose Status Informant  atorvastatin (LIPITOR) 20 MG tablet [id number]  Take 1 tablet (20 mg total) by mouth daily. Baruch Gouty, FNP  Active   BD PEN NEEDLE NANO 2ND GEN 32G X 4 MM MISC [id number]  USE WITH INSULIN 4 TIMES A DAY DX E11.9 Dettinger, Fransisca Kaufmann, MD  Active   Cholecalciferol (VITAMIN D3) 125 MCG (5000 UT) CAPS [id number]  Take 1 capsule by mouth daily. Takes 6 days a week  [provider]  Active            Med Note (HOPKINS, AMY E   Wed May 15, 2021  1:21 PM) Taking 2000 U QD  co-enzyme Q-10 30 MG capsule [id number]  Take 100 mg by mouth daily. [provider]  Active Self  diltiazem (CARDIZEM CD) 240 MG 24 hr capsule [id number]  TAKE 1 CAPSULE BY MOUTH EVERY DAY Lorretta Harp, MD  Active   fluticasone (FLONASE) 50 MCG/ACT nasal spray [id number]  SPRAY 2 SPRAYS INTO EACH NOSTRIL EVERY DAY  Patient taking differently: as needed.   Dettinger, Fransisca Kaufmann, MD  Active   Insulin Disposable Pump (OMNIPOD 5 G6 POD, GEN 5,) MISC [id number]  Inject into the skin. [provider]  Active   Insulin Glargine (BASAGLAR KWIKPEN) 100 UNIT/ML [id number]  Inject 19 Units into the skin daily. Baruch Gouty, FNP  Active            Med Note Parthenia Ames Jul 04, 2021 12:16 PM) [place] patient assistance program    insulin lispro (HUMALOG KWIKPEN) 200 UNIT/ML KwikPen [id number]  Inject 16 Units into the skin 3 (three) times daily with meals. Baruch Gouty, FNP  Active   pregabalin (LYRICA) 50 MG capsule [id number]  1 capsule [provider]  Active   sildenafil (REVATIO) 20 MG tablet [id number]  TAKE 2-5 TABLETS AS NEEDED PRIOR TO SEXUAL ACTIVITY Baruch Gouty, FNP  Active   valACYclovir (VALTREX) 500 MG tablet [id number]  TAKE 4 TABLETS BY MOUTH TWICE DAILY AS DIRECTED AND AS NEEDED Ronnie Doss M, DO  Active   warfarin (COUMADIN) 2.5 MG tablet [id number]  TAKE 1 TO 1 AND 1/2 TABLETS DAILY AS DIRECTED Baruch Gouty, FNP  Active             Patient Active Problem List   Diagnosis Date Noted   Diabetic neuropathy ([place]) 12/20/2020   Asbestosis ([place]) 12/20/2020   Hypertension associated with diabetes ([place]) 12/18/2020   Hyperglycemia due to type 2 diabetes mellitus ([place]) 05/14/2020   Hypoglycemia 05/14/2020   Pure hypercholesterolemia 05/14/2020   Left carotid bruit 09/07/2019   Arthritis of hand 04/26/2019   Bilateral  thumb pain 04/26/2019   Primary osteoarthritis, left hand 01/04/2019   Pseudogout of wrist, left 01/04/2019   Corn of foot 06/15/2018   Bilateral impacted cerumen 04/08/2018   Erectile dysfunction due to arterial insufficiency 06/23/2017   Asymmetrical right sensorineural hearing loss 03/25/2017   Subjective tinnitus of both ears 03/25/2017   Primary hypercoagulable state ([place]) [D68.59] 03/04/2016   History of DVT of lower extremity 03/04/2016   Melanoma of skin ([place]) 11/14/2015   Chronic atrial fibrillation ([place]) 07/04/2015   Incisional hernia, without obstruction or gangrene 11/29/2013   Vitamin D deficiency 11/29/2013   Type 2 diabetes mellitus treated with insulin ([place]) 07/29/2012   Long term (current) use of anticoagulants 04/22/2012   Hyperlipidemia associated with type 2 diabetes mellitus ([place]) 06/23/2008   Benign essential HTN 06/23/2008   Asbestos exposure 06/23/2008    Immunization History  Administered Date(s) Administered   Fluad Quad(high Dose 65+) 12/27/2019   Influenza Split 12/05/2010, 12/03/2011, 12/04/2012   Influenza, High Dose Seasonal PF 12/30/2017, 12/01/2018, 12/01/2018   Influenza, Quadrivalent, Recombinant, Inj, Pf 11/20/2020   Influenza,inj,Quad PF,6+ Mos 12/12/2014, 11/14/2015, 10/27/2016   Influenza-Unspecified 11/04/2013   Moderna SARS-COV2 Booster Vaccination 01/16/2021   Moderna Sars-Covid-2 Vaccination 03/01/2019, 03/29/2019, 11/29/2019   Pneumococcal Conjugate-13 04/30/2016   Pneumococcal Polysaccharide-23 12/04/2012   Tdap 04/26/2009, 07/12/2014    Conditions to be addressed/monitored: DMII  Care Plan : PHARMD MEDICATION MANAGEMENT  Updates made by Lavera Guise, RPH since 08/02/2021 12:00 AM     Problem: DISEASE PROGRESSION PREVENTION      Long-Range Goal: Patient Stated   This Visit's Progress: On track  Note:   Current Barriers:  Unable to independently afford treatment regimen  Pharmacist Clinical Goal(s):  patient will  verbalize ability to afford treatment regimen through collaboration with PharmD and provider.    Interventions:  1:1 collaboration with Rakes, Connye Burkitt, FNP regarding development and update of comprehensive plan of care as evidenced by provider attestation and co-signature Inter-disciplinary care team collaboration (see longitudinal plan of care) Comprehensive medication review performed; medication list updated in electronic medical record  Diabetes: New goal. Controlled--currently sees Endocrine (Dr. Chalmers Cater in [facility]) current treatment: lantus/novolog --> basaglar/humalog (same dosing as prescribed by endo), metformin Discussions regarding transtion to omnipod system Compatible with dexcom g6 technology  Ordered via advanced diabetes supply via [employer] Discussed with patient and wife that he may not qualify yet since he is already using libre 2 CGM technology as medicare only allows for 1 new cgm device per 5 years  Denies hypoglycemic/hyperglycemic symptoms Assessed patient finances. Patient approved for [employer] patient assistance program for basaglar, humalog (he has previously been on this combination of insulins before);  decision  to transition to basaglar/Humalog was made based on cost   Patient Goals/Self-Care Activities patient will:  - take medications as prescribed as evidenced by patient report and record review check glucose using CGM libre for now, document, and provide at future appointments collaborate with provider on medication access solutions target a minimum of 150 minutes of moderate intensity exercise weekly engage in dietary modifications by    FOLLOWING A HEART HEALTHY DIET/HEALTHY PLATE METHOD    Follow Up:  Patient agrees to Care Plan and Follow-up.  Plan: Telephone follow up appointment with care management team member scheduled for:  3 MONTHS    Regina Eck, PharmD, BCPS Clinical Pharmacist, [place]  II Phone [phone]

## 2021-08-02 DIAGNOSIS — E785 Hyperlipidemia, unspecified: Secondary | ICD-10-CM

## 2021-08-02 DIAGNOSIS — E1169 Type 2 diabetes mellitus with other specified complication: Secondary | ICD-10-CM

## 2021-08-02 DIAGNOSIS — Z794 Long term (current) use of insulin: Secondary | ICD-10-CM

## 2021-08-02 DIAGNOSIS — E119 Type 2 diabetes mellitus without complications: Secondary | ICD-10-CM

## 2021-08-02 NOTE — Patient Instructions (Addendum)
Visit Information  Following are the goals we discussed today: Current Barriers:  Unable to independently afford treatment regimen  Pharmacist Clinical Goal(s):  patient will verbalize ability to afford treatment regimen through collaboration with PharmD and provider.    Interventions: 1:1 collaboration with Rakes, Connye Burkitt, FNP regarding development and update of comprehensive plan of care as evidenced by provider attestation and co-signature Inter-disciplinary care team collaboration (see longitudinal plan of care) Comprehensive medication review performed; medication list updated in electronic medical record  Diabetes: New goal. Controlled--currently sees Endocrine ([facility] Chalmers Cater in [facility]) current treatment: lantus/novolog --> basaglar/humalog (same dosing as prescribed by endo), metformin Discussions regarding transtion to omnipod system Compatible with dexcom g6 technology  Ordered via advanced diabetes supply via [employer] Discussed with patient and wife that he may not qualify yet since he is already using libre 2 CGM technology as medicare only allows for 1 new cgm device per 5 years  Denies hypoglycemic/hyperglycemic symptoms Assessed patient finances. Patient approved for [employer] patient assistance program for basaglar, humalog (he has previously been on this combination of insulins before);  decision  to transition to basaglar/Humalog was made based on cost   Patient Goals/Self-Care Activities patient will:  - take medications as prescribed as evidenced by patient report and record review check glucose using CGM libre for now, document, and provide at future appointments collaborate with provider on medication access solutions target a minimum of 150 minutes of moderate intensity exercise weekly engage in dietary modifications by  FOLLOWING A HEART HEALTHY DIET/HEALTHY PLATE METHOD   Plan: Telephone follow up appointment with care management team member scheduled for:   1 month  Signature Regina Eck, PharmD, BCPS Clinical Pharmacist, [place]  II Phone [phone]   Please call the care guide team at [phone] if you need to cancel or reschedule your appointment.   The patient verbalized understanding of instructions, educational materials, and care plan provided today and DECLINED offer to receive copy of patient instructions, educational materials, and care plan.

## 2021-08-19 ENCOUNTER — Telehealth: Payer: Self-pay | Admitting: Family Medicine

## 2021-08-20 ENCOUNTER — Encounter: Payer: Self-pay | Admitting: Family Medicine

## 2021-08-20 ENCOUNTER — Ambulatory Visit: Payer: Medicare PPO | Admitting: Family Medicine

## 2021-08-20 VITALS — BP 133/71 | HR 54 | Temp 98.4°F | Ht 72.0 in | Wt 161.0 lb

## 2021-08-20 DIAGNOSIS — I482 Chronic atrial fibrillation, unspecified: Secondary | ICD-10-CM | POA: Diagnosis not present

## 2021-08-20 DIAGNOSIS — E785 Hyperlipidemia, unspecified: Secondary | ICD-10-CM | POA: Diagnosis not present

## 2021-08-20 DIAGNOSIS — E1159 Type 2 diabetes mellitus with other circulatory complications: Secondary | ICD-10-CM

## 2021-08-20 DIAGNOSIS — Z794 Long term (current) use of insulin: Secondary | ICD-10-CM

## 2021-08-20 DIAGNOSIS — Z7901 Long term (current) use of anticoagulants: Secondary | ICD-10-CM | POA: Diagnosis not present

## 2021-08-20 DIAGNOSIS — E559 Vitamin D deficiency, unspecified: Secondary | ICD-10-CM | POA: Diagnosis not present

## 2021-08-20 DIAGNOSIS — I152 Hypertension secondary to endocrine disorders: Secondary | ICD-10-CM

## 2021-08-20 DIAGNOSIS — Z23 Encounter for immunization: Secondary | ICD-10-CM | POA: Diagnosis not present

## 2021-08-20 DIAGNOSIS — N521 Erectile dysfunction due to diseases classified elsewhere: Secondary | ICD-10-CM | POA: Diagnosis not present

## 2021-08-20 DIAGNOSIS — E1169 Type 2 diabetes mellitus with other specified complication: Secondary | ICD-10-CM | POA: Diagnosis not present

## 2021-08-20 DIAGNOSIS — E119 Type 2 diabetes mellitus without complications: Secondary | ICD-10-CM | POA: Diagnosis not present

## 2021-08-20 LAB — COAGUCHEK XS/INR WAIVED
INR: 1.6 — ABNORMAL HIGH (ref 0.9–1.1)
Prothrombin Time: 18.9 s

## 2021-08-20 LAB — BAYER DCA HB A1C WAIVED: HB A1C (BAYER DCA - WAIVED): 7.7 % — ABNORMAL HIGH (ref 4.8–5.6)

## 2021-08-20 LAB — POCT INR: INR: 1.6 — AB (ref 2.0–3.0)

## 2021-08-20 MED ORDER — SILDENAFIL CITRATE 50 MG PO TABS: 50.0000 mg | ORAL_TABLET | Freq: Every day | ORAL | 5 refills | Status: DC | PRN
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
## 2021-08-20 NOTE — Patient Instructions (Addendum)
Change in coumadin dosing, noted on anticoagulation calendar. Repeat INR in one week.   [facility] up account with e-mail.

## 2021-08-21 LAB — CMP14+EGFR
ALT: 20 IU/L (ref 0–44)
AST: 25 IU/L (ref 0–40)
Albumin/Globulin Ratio: 2.5 — ABNORMAL HIGH (ref 1.2–2.2)
Albumin: 4.7 g/dL (ref 3.9–4.9)
Alkaline Phosphatase: 65 IU/L (ref 44–121)
BUN/Creatinine Ratio: 23 (ref 10–24)
BUN: 25 mg/dL (ref 8–27)
Bilirubin Total: 0.6 mg/dL (ref 0.0–1.2)
CO2: 23 mmol/L (ref 20–29)
Calcium: 9.5 mg/dL (ref 8.6–10.2)
Chloride: 103 mmol/L (ref 96–106)
Creatinine, Ser: 1.08 mg/dL (ref 0.76–1.27)
Globulin, Total: 1.9 g/dL (ref 1.5–4.5)
Glucose: 295 mg/dL — ABNORMAL HIGH (ref 70–99)
Potassium: 4.9 mmol/L (ref 3.5–5.2)
Sodium: 138 mmol/L (ref 134–144)
Total Protein: 6.6 g/dL (ref 6.0–8.5)
eGFR: 74 mL/min/{1.73_m2} (ref >59)

## 2021-08-21 LAB — CBC WITH DIFFERENTIAL/PLATELET
Basophils Absolute: 0 10*3/uL (ref 0.0–0.2)
Basos: 1 %
EOS (ABSOLUTE): 0.1 10*3/uL (ref 0.0–0.4)
Eos: 2 %
Hematocrit: 42.4 % (ref 37.5–51.0)
Hemoglobin: 14.5 g/dL (ref 13.0–17.7)
Immature Grans (Abs): 0 10*3/uL (ref 0.0–0.1)
Immature Granulocytes: 0 %
Lymphocytes Absolute: 1.4 10*3/uL (ref 0.7–3.1)
Lymphs: 27 %
MCH: 32.1 pg (ref 26.6–33.0)
MCHC: 34.2 g/dL (ref 31.5–35.7)
MCV: 94 fL (ref 79–97)
Monocytes Absolute: 0.3 10*3/uL (ref 0.1–0.9)
Monocytes: 6 %
Neutrophils Absolute: 3.3 10*3/uL (ref 1.4–7.0)
Neutrophils: 64 %
Platelets: 153 10*3/uL (ref 150–450)
RBC: 4.52 x10E6/uL (ref 4.14–5.80)
RDW: 12.5 % (ref 11.6–15.4)
WBC: 5.1 10*3/uL (ref 3.4–10.8)

## 2021-08-21 LAB — LIPID PANEL
Chol/HDL Ratio: 2 ratio (ref 0.0–5.0)
Cholesterol, Total: 137 mg/dL (ref 100–199)
HDL: 68 mg/dL (ref >39)
LDL Chol Calc (NIH): 58 mg/dL (ref 0–99)
Triglycerides: 48 mg/dL (ref 0–149)
VLDL Cholesterol Cal: 11 mg/dL (ref 5–40)

## 2021-08-21 LAB — THYROID PANEL WITH TSH
Free Thyroxine Index: 1.9 (ref 1.2–4.9)
T3 Uptake Ratio: 28 % (ref 24–39)
T4, Total: 6.8 ug/dL (ref 4.5–12.0)
TSH: 1.93 u[IU]/mL (ref 0.450–4.500)

## 2021-08-21 LAB — VITAMIN D 25 HYDROXY (VIT D DEFICIENCY, FRACTURES): Vit D, 25-Hydroxy: 56 ng/mL (ref 30.0–100.0)

## 2021-08-30 DIAGNOSIS — E1165 Type 2 diabetes mellitus with hyperglycemia: Secondary | ICD-10-CM | POA: Diagnosis not present

## 2021-09-04 ENCOUNTER — Telehealth: Payer: Self-pay | Admitting: Family Medicine

## 2021-09-05 NOTE — Telephone Encounter (Signed)
Pt r/c.

## 2021-09-05 NOTE — Telephone Encounter (Signed)
Attempted to call pt , no answer left vm for cb  

## 2021-09-06 ENCOUNTER — Other Ambulatory Visit: Payer: Self-pay | Admitting: Family Medicine

## 2021-09-06 DIAGNOSIS — E1169 Type 2 diabetes mellitus with other specified complication: Secondary | ICD-10-CM

## 2021-09-06 MED ORDER — TADALAFIL 20 MG PO TABS: 10.0000 mg | ORAL_TABLET | ORAL | 11 refills | Status: DC | PRN

## 2021-09-06 NOTE — Telephone Encounter (Signed)
Spoke to patient. Rec'd sildenafil by mail order. States medication had very little effect. Patient spoke Aaron Edelman at [facility] in [place].  He was told about generic cialis.   Patient would like to try this instead of the sildenafil.

## 2021-09-09 ENCOUNTER — Other Ambulatory Visit: Payer: Medicare PPO

## 2021-09-09 ENCOUNTER — Ambulatory Visit (INDEPENDENT_AMBULATORY_CARE_PROVIDER_SITE_OTHER): Payer: Medicare PPO | Admitting: Family

## 2021-09-09 ENCOUNTER — Encounter: Payer: Self-pay | Admitting: Family

## 2021-09-09 VITALS — BP 131/78 | HR 60 | Temp 97.6°F | Ht 72.0 in | Wt 159.6 lb

## 2021-09-09 DIAGNOSIS — E119 Type 2 diabetes mellitus without complications: Secondary | ICD-10-CM

## 2021-09-09 DIAGNOSIS — I482 Chronic atrial fibrillation, unspecified: Secondary | ICD-10-CM | POA: Diagnosis not present

## 2021-09-09 DIAGNOSIS — Z86718 Personal history of other venous thrombosis and embolism: Secondary | ICD-10-CM | POA: Diagnosis not present

## 2021-09-09 DIAGNOSIS — D6859 Other primary thrombophilia: Secondary | ICD-10-CM | POA: Diagnosis not present

## 2021-09-09 DIAGNOSIS — Z7901 Long term (current) use of anticoagulants: Secondary | ICD-10-CM | POA: Diagnosis not present

## 2021-09-09 LAB — COAGUCHEK XS/INR WAIVED
INR: 2.2 — ABNORMAL HIGH (ref 0.9–1.1)
Prothrombin Time: 26.4 s

## 2021-09-09 NOTE — Telephone Encounter (Signed)
Pt aware.

## 2021-09-09 NOTE — Progress Notes (Signed)
   Subjective:    [name]: Christopher Avery, male    DOB: 1950-03-27, 71 y.o.   MRN: [id number]  Chief Complaint  Patient presents with   Anticoagulation    HPI  PT presents to the office today for INR check. He takes warfarin for atrial fibrillation. His INR is 2.2 today.   He is currently taking warfarin 2.5 mg daily except on Friday he takes 3.75 mg.   Denies any bleeding, bruising, missed doses, or extra doses.    Review of Systems  All other systems reviewed and are negative.      Objective:   Physical Exam Vitals reviewed.  Constitutional:      General: He is not in acute distress.    Appearance: He is well-developed.  HENT:     Head: Normocephalic.  Eyes:     General:        Right eye: No discharge.        Left eye: No discharge.     Pupils: Pupils are equal, round, and reactive to light.  Neck:     Thyroid: No thyromegaly.  Cardiovascular:     Rate and Rhythm: Normal rate and regular rhythm.     Heart sounds: Normal heart sounds. No murmur heard. Pulmonary:     Effort: Pulmonary effort is normal. No respiratory distress.     Breath sounds: Normal breath sounds. No wheezing.  Abdominal:     General: Bowel sounds are normal. There is no distension.     Palpations: Abdomen is soft.     Tenderness: There is no abdominal tenderness.  Musculoskeletal:        General: No tenderness. Normal range of motion.     Cervical back: Normal range of motion and neck supple.  Skin:    General: Skin is warm and dry.     Findings: No erythema or rash.  Neurological:     Mental Status: He is alert and oriented to person, place, and time.     Cranial Nerves: No cranial nerve deficit.     Deep Tendon Reflexes: Reflexes are normal and symmetric.  Psychiatric:        Behavior: Behavior normal.        Thought Content: Thought content normal.        Judgment: Judgment normal.      BP 131/78   Pulse 60   Temp 97.6 F (36.4 C) (Temporal)   Ht 6' (1.829 m)   Wt 159  lb 9.6 oz (72.4 kg)   SpO2 99%   BMI 21.65 kg/m       Assessment & Plan:  Christopher Avery comes in today with chief complaint of Anticoagulation   Diagnosis and orders addressed:  1. Long term (current) use of anticoagulants - CoaguChek XS/INR Waived  2. Chronic atrial fibrillation (HCC)  3. Primary hypercoagulable state ([place])  4. History of DVT of lower extremity Description   INR 2.2 today, at goal  Continue 2.5 mg (1 tablet) daily, expect Friday take 3.75 mg ( 1 1/2 tabs). Recheck in 1 month with PCP         Evelina Dun, FNP

## 2021-09-09 NOTE — Patient Instructions (Signed)
Description   INR 2.2 today, at goal  Continue 2.5 mg (1 tablet) daily, expect Friday take 3.75 mg ( 1 1/2 tabs). Recheck in 1 month with PCP

## 2021-09-10 ENCOUNTER — Other Ambulatory Visit: Payer: Self-pay | Admitting: *Deleted

## 2021-09-10 ENCOUNTER — Telehealth: Payer: Self-pay | Admitting: Family Medicine

## 2021-09-10 ENCOUNTER — Telehealth: Payer: Self-pay | Admitting: Pulmonary Disease

## 2021-09-10 DIAGNOSIS — Z77090 Contact with and (suspected) exposure to asbestos: Secondary | ICD-10-CM

## 2021-09-10 NOTE — Telephone Encounter (Signed)
New PFT order placed as the one from 08/28/20 expired after a year.  Patient scheduled for PFT and OV on 11/28/21.  Nothing further needed.

## 2021-09-17 ENCOUNTER — Other Ambulatory Visit: Payer: Self-pay | Admitting: Family Medicine

## 2021-09-20 DIAGNOSIS — S6992XA Unspecified injury of left wrist, hand and finger(s), initial encounter: Secondary | ICD-10-CM | POA: Diagnosis not present

## 2021-10-22 ENCOUNTER — Other Ambulatory Visit: Payer: Self-pay | Admitting: Family Medicine

## 2021-10-30 ENCOUNTER — Ambulatory Visit: Payer: Medicare PPO

## 2021-11-04 ENCOUNTER — Ambulatory Visit (INDEPENDENT_AMBULATORY_CARE_PROVIDER_SITE_OTHER): Payer: Medicare PPO

## 2021-11-04 DIAGNOSIS — Z23 Encounter for immunization: Secondary | ICD-10-CM

## 2021-11-04 NOTE — Progress Notes (Signed)
Shingrix given to patient and tolerated well.

## 2021-11-15 DIAGNOSIS — I10 Essential (primary) hypertension: Secondary | ICD-10-CM | POA: Diagnosis not present

## 2021-11-15 DIAGNOSIS — G629 Polyneuropathy, unspecified: Secondary | ICD-10-CM | POA: Diagnosis not present

## 2021-11-15 DIAGNOSIS — E7800 Pure hypercholesterolemia, unspecified: Secondary | ICD-10-CM | POA: Diagnosis not present

## 2021-11-15 DIAGNOSIS — E1165 Type 2 diabetes mellitus with hyperglycemia: Secondary | ICD-10-CM | POA: Diagnosis not present

## 2021-11-15 DIAGNOSIS — E162 Hypoglycemia, unspecified: Secondary | ICD-10-CM | POA: Diagnosis not present

## 2021-11-19 DIAGNOSIS — E1165 Type 2 diabetes mellitus with hyperglycemia: Secondary | ICD-10-CM | POA: Diagnosis not present

## 2021-11-28 ENCOUNTER — Ambulatory Visit: Payer: Medicare PPO | Admitting: Pulmonary Disease

## 2021-12-09 ENCOUNTER — Telehealth: Payer: Self-pay | Admitting: Family Medicine

## 2021-12-11 NOTE — Telephone Encounter (Signed)
Patient hadn't heard back from message 11-6 for insulin through [facility]. Please call.

## 2021-12-11 NOTE — Telephone Encounter (Signed)
Left message requesting call back regarding re-enrollment with [facility] for basaglar and humalog.  Will continue to mail re-enrollment application to patients home.

## 2021-12-11 NOTE — Telephone Encounter (Signed)
Pt calling back regarding [facility] re-enrollment. Informed patient I will be mailing application to his home.

## 2021-12-17 NOTE — Telephone Encounter (Signed)
Mailed re-enrollment application to pt's home

## 2021-12-25 ENCOUNTER — Encounter: Payer: Self-pay | Admitting: Family Medicine

## 2021-12-25 ENCOUNTER — Ambulatory Visit: Payer: Medicare PPO | Admitting: Family Medicine

## 2021-12-25 VITALS — BP 137/85 | HR 72 | Temp 97.5°F | Ht 73.0 in | Wt 160.2 lb

## 2021-12-25 DIAGNOSIS — I482 Chronic atrial fibrillation, unspecified: Secondary | ICD-10-CM | POA: Diagnosis not present

## 2021-12-25 DIAGNOSIS — D6859 Other primary thrombophilia: Secondary | ICD-10-CM

## 2021-12-25 DIAGNOSIS — Z86718 Personal history of other venous thrombosis and embolism: Secondary | ICD-10-CM | POA: Diagnosis not present

## 2021-12-25 DIAGNOSIS — Z7901 Long term (current) use of anticoagulants: Secondary | ICD-10-CM | POA: Diagnosis not present

## 2021-12-25 LAB — COAGUCHEK XS/INR WAIVED
INR: 1.2 — ABNORMAL HIGH (ref 0.9–1.1)
Prothrombin Time: 14.7 s

## 2021-12-25 LAB — POCT INR: INR: 1.2 — AB (ref 2.0–3.0)
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
## 2021-12-27 ENCOUNTER — Ambulatory Visit: Payer: Medicare PPO | Admitting: Primary Care

## 2021-12-30 DIAGNOSIS — G4733 Obstructive sleep apnea (adult) (pediatric): Secondary | ICD-10-CM | POA: Diagnosis not present

## 2021-12-31 ENCOUNTER — Ambulatory Visit: Payer: Medicare PPO | Admitting: Primary Care

## 2022-01-09 ENCOUNTER — Encounter: Payer: Self-pay | Admitting: Family Medicine

## 2022-01-09 ENCOUNTER — Ambulatory Visit: Payer: Medicare PPO | Admitting: Family Medicine

## 2022-01-09 VITALS — BP 134/77 | HR 84 | Temp 97.8°F | Ht 73.0 in | Wt 163.0 lb

## 2022-01-09 DIAGNOSIS — N521 Erectile dysfunction due to diseases classified elsewhere: Secondary | ICD-10-CM | POA: Diagnosis not present

## 2022-01-09 DIAGNOSIS — I482 Chronic atrial fibrillation, unspecified: Secondary | ICD-10-CM

## 2022-01-09 DIAGNOSIS — E1169 Type 2 diabetes mellitus with other specified complication: Secondary | ICD-10-CM

## 2022-01-09 DIAGNOSIS — Z86718 Personal history of other venous thrombosis and embolism: Secondary | ICD-10-CM

## 2022-01-09 DIAGNOSIS — D6859 Other primary thrombophilia: Secondary | ICD-10-CM | POA: Diagnosis not present

## 2022-01-09 LAB — COAGUCHEK XS/INR WAIVED
INR: 2.5 — ABNORMAL HIGH (ref 0.9–1.1)
Prothrombin Time: 30.6 s

## 2022-01-09 LAB — POCT INR: INR: 2.5 (ref 2.0–3.0)

## 2022-01-09 MED ORDER — TADALAFIL 20 MG PO TABS: 10.0000 mg | ORAL_TABLET | ORAL | 4 refills | Status: DC | PRN
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
## 2022-01-15 ENCOUNTER — Ambulatory Visit (INDEPENDENT_AMBULATORY_CARE_PROVIDER_SITE_OTHER): Payer: Worker's Compensation | Admitting: Pulmonary Disease

## 2022-01-15 DIAGNOSIS — Z77090 Contact with and (suspected) exposure to asbestos: Secondary | ICD-10-CM | POA: Diagnosis not present

## 2022-01-15 LAB — PULMONARY FUNCTION TEST
DL/VA % pred: 97 %
DL/VA: 3.93 ml/min/mmHg/L
DLCO cor % pred: 98 %
DLCO cor: 26.27 ml/min/mmHg
DLCO unc % pred: 98 %
DLCO unc: 26.27 ml/min/mmHg
FEF 25-75 Post: 3.11 L/sec
FEF 25-75 Pre: 2.93 L/sec
FEF2575-%Change-Post: 6 %
FEF2575-%Pred-Post: 121 %
FEF2575-%Pred-Pre: 114 %
FEV1-%Change-Post: 0 %
FEV1-%Pred-Post: 108 %
FEV1-%Pred-Pre: 108 %
FEV1-Post: 3.66 L
FEV1-Pre: 3.67 L
FEV1FVC-%Change-Post: 0 %
FEV1FVC-%Pred-Pre: 105 %
FEV6-%Change-Post: 0 %
FEV6-%Pred-Post: 108 %
FEV6-%Pred-Pre: 109 %
FEV6-Post: 4.72 L
FEV6-Pre: 4.75 L
FEV6FVC-%Change-Post: 0 %
FEV6FVC-%Pred-Post: 105 %
FEV6FVC-%Pred-Pre: 105 %
FVC-%Change-Post: 0 %
FVC-%Pred-Post: 102 %
FVC-%Pred-Pre: 103 %
FVC-Post: 4.73 L
FVC-Pre: 4.75 L
Post FEV1/FVC ratio: 77 %
Post FEV6/FVC ratio: 100 %
Pre FEV1/FVC ratio: 77 %
Pre FEV6/FVC Ratio: 100 %
RV % pred: 47 %
RV: 1.19 L
TLC % pred: 84 %
TLC: 6.12 L

## 2022-01-15 NOTE — Patient Instructions (Signed)
Full PFT performed today. °

## 2022-01-15 NOTE — Progress Notes (Signed)
Full PFT performed today. °

## 2022-01-16 ENCOUNTER — Telehealth (INDEPENDENT_AMBULATORY_CARE_PROVIDER_SITE_OTHER): Payer: Medicare PPO | Admitting: Primary Care

## 2022-01-16 ENCOUNTER — Encounter: Payer: Self-pay | Admitting: Primary Care

## 2022-01-16 ENCOUNTER — Ambulatory Visit: Payer: Medicare PPO

## 2022-01-16 DIAGNOSIS — Z77090 Contact with and (suspected) exposure to asbestos: Secondary | ICD-10-CM

## 2022-01-16 NOTE — Progress Notes (Signed)
Virtual Visit via Video Note  I connected with Christopher Avery on 01/16/22 at  9:00 AM EST by a video enabled telemedicine application and verified that I am speaking with the correct person using two identifiers.  Location: Patient: Home Provider: Office   I discussed the limitations of evaluation and management by telemedicine and the availability of in person appointments. The patient expressed understanding and agreed to proceed.  History of Present Illness:   Christopher Avery is a 71 year old male, never smoker with history of asbestos exposure who returns to pulmonary clinic for follow up.   He denies any issues since being seen in January 2021.  He denies any shortness of breath or limitations to his daily activities.  He denies any cough, sputum production, hemoptysis, chest pain or chest tightness.  He denies any sinus congestion or drainage.  He denies any heartburn or reflux.  He had exposure to asbestos for 25 years while he was part of [employer].  He is enrolled in an annual asbestos exposure surveillance program through his work exposures.  01/16/2022- Interim Patient contacted today to review PFTs results. 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  He is doing well, no acute respiratory complaints PFTs today showed normal lung function and diffusion capacity. No significant decline from previous.  Needs annual CXR  Denies chest tightness, wheezing, shortness of breath or cough    Observations/Objective:  Appears well; No overt shortness of breath, wheezing or cough  01/16/2022 PFTs >> FVC 4.73 (102%), FEV 1 3.66 (108%), ratio 77, DLCO26.27 (98%)  Assessment and Plan:  Asbestosis exposure - Remains asymptomatic  - PFTs on 01/15/22 showed normal lung function and diffusion capacity - Needs annual chest xray - Advised patient to notify office if he develops sob, chest  pain or cough   Follow Up Instructions:   Annually with PFTs and CXR  I discussed the assessment and treatment plan with the patient. The patient was provided an opportunity to ask questions and all were answered. The patient agreed with the plan and demonstrated an understanding of the instructions.   The patient was advised to call back or seek an in-person evaluation if the symptoms worsen or if the condition fails to improve as anticipated.  I provided 20 minutes of non-face-to-face time during this encounter.   Martyn Ehrich, NP

## 2022-01-16 NOTE — Addendum Note (Signed)
Addended by: Mathis Bud on: 01/16/2022 09:47 AM   Modules accepted: Orders

## 2022-01-16 NOTE — Patient Instructions (Signed)
Please come by any time for CXR  Follow-up 1 year with Dr. Erin Fulling

## 2022-01-17 ENCOUNTER — Telehealth: Payer: Self-pay | Admitting: Family Medicine

## 2022-01-17 NOTE — Telephone Encounter (Signed)
Pharmacy states they did receive prescription- pharmacy tech states he had a duplicate chart on their file and this is now fix and prescription is ready under email. [email or web address]

## 2022-01-17 NOTE — Telephone Encounter (Signed)
Pt aware.

## 2022-01-17 NOTE — Telephone Encounter (Signed)
Pt says he called the mail order pharmacy he uses to get a refill on his CIALIS. Says he was told that they dont have record of the Rx or him taking it.

## 2022-01-24 ENCOUNTER — Telehealth: Payer: Self-pay

## 2022-01-24 NOTE — Telephone Encounter (Signed)
Received notification from [place] regarding patient assistance DENIAL for [place].   PT DOESN'T MEET FINANCIAL CRITERIA FOR PROGRAM.   PT SHOULD RECEIVE NOTIFICATION BY MAIL FROM [facility]  Phone: [phone]

## 2022-02-03 ENCOUNTER — Other Ambulatory Visit: Payer: Self-pay | Admitting: Family Medicine

## 2022-02-17 DIAGNOSIS — E1165 Type 2 diabetes mellitus with hyperglycemia: Secondary | ICD-10-CM | POA: Diagnosis not present

## 2022-02-20 ENCOUNTER — Ambulatory Visit: Payer: Medicare PPO | Admitting: Family Medicine

## 2022-02-20 ENCOUNTER — Encounter: Payer: Self-pay | Admitting: Family Medicine

## 2022-02-20 VITALS — Ht 73.0 in

## 2022-02-20 DIAGNOSIS — I482 Chronic atrial fibrillation, unspecified: Secondary | ICD-10-CM | POA: Diagnosis not present

## 2022-02-20 LAB — COAGUCHEK XS/INR WAIVED
INR: 2 — ABNORMAL HIGH (ref 0.9–1.1)
Prothrombin Time: 23.6 s

## 2022-02-21 NOTE — Progress Notes (Signed)
Not seen

## 2022-02-24 DIAGNOSIS — M79645 Pain in left finger(s): Secondary | ICD-10-CM | POA: Diagnosis not present

## 2022-02-24 DIAGNOSIS — M79644 Pain in right finger(s): Secondary | ICD-10-CM | POA: Diagnosis not present

## 2022-02-24 DIAGNOSIS — M79642 Pain in left hand: Secondary | ICD-10-CM | POA: Diagnosis not present

## 2022-02-24 DIAGNOSIS — M13842 Other specified arthritis, left hand: Secondary | ICD-10-CM | POA: Diagnosis not present

## 2022-02-24 DIAGNOSIS — M13841 Other specified arthritis, right hand: Secondary | ICD-10-CM | POA: Diagnosis not present

## 2022-02-24 DIAGNOSIS — M25532 Pain in left wrist: Secondary | ICD-10-CM | POA: Diagnosis not present

## 2022-02-24 DIAGNOSIS — Z01818 Encounter for other preprocedural examination: Secondary | ICD-10-CM | POA: Diagnosis not present

## 2022-02-26 ENCOUNTER — Telehealth: Payer: Self-pay | Admitting: Family Medicine

## 2022-02-26 NOTE — Telephone Encounter (Signed)
TC back to pt gave him [phone] to [facility] for enrollment in [place], also gave him [phone] to the local [employer]

## 2022-03-03 ENCOUNTER — Telehealth (INDEPENDENT_AMBULATORY_CARE_PROVIDER_SITE_OTHER): Payer: Medicare PPO | Admitting: *Deleted

## 2022-03-03 DIAGNOSIS — L570 Actinic keratosis: Secondary | ICD-10-CM | POA: Diagnosis not present

## 2022-03-03 DIAGNOSIS — Z7901 Long term (current) use of anticoagulants: Secondary | ICD-10-CM | POA: Diagnosis not present

## 2022-03-03 DIAGNOSIS — I4821 Permanent atrial fibrillation: Secondary | ICD-10-CM | POA: Diagnosis not present

## 2022-03-03 DIAGNOSIS — Z85820 Personal history of malignant melanoma of skin: Secondary | ICD-10-CM | POA: Diagnosis not present

## 2022-03-03 DIAGNOSIS — Z08 Encounter for follow-up examination after completed treatment for malignant neoplasm: Secondary | ICD-10-CM | POA: Diagnosis not present

## 2022-03-03 DIAGNOSIS — I482 Chronic atrial fibrillation, unspecified: Secondary | ICD-10-CM

## 2022-03-03 DIAGNOSIS — Z1283 Encounter for screening for malignant neoplasm of skin: Secondary | ICD-10-CM | POA: Diagnosis not present

## 2022-03-03 DIAGNOSIS — X32XXXD Exposure to sunlight, subsequent encounter: Secondary | ICD-10-CM | POA: Diagnosis not present

## 2022-03-03 DIAGNOSIS — Z86718 Personal history of other venous thrombosis and embolism: Secondary | ICD-10-CM

## 2022-03-03 DIAGNOSIS — C44712 Basal cell carcinoma of skin of right lower limb, including hip: Secondary | ICD-10-CM | POA: Diagnosis not present

## 2022-03-03 DIAGNOSIS — D6859 Other primary thrombophilia: Secondary | ICD-10-CM

## 2022-03-03 DIAGNOSIS — D225 Melanocytic nevi of trunk: Secondary | ICD-10-CM | POA: Diagnosis not present

## 2022-03-03 LAB — POCT INR: INR: 1.5 — AB (ref 2–3)

## 2022-03-03 NOTE — Telephone Encounter (Signed)
INR 1.5 today, not at goal  Take additional 2.5 mg tablet today for total dose of 5 mg today. Then resume previous schedule of 2.5 mg (1 tablet) daily, expect Wednesday and Friday take 3.75 mg ( 1 1/2 tabs). Recheck on 03/07/22

## 2022-03-03 NOTE — Telephone Encounter (Signed)
Pt aware of results and recommendations and voiced understanding. 

## 2022-03-03 NOTE — Addendum Note (Signed)
Addended by: Gwenlyn Perking on: 03/03/2022 05:00 PM   Modules accepted: Level of Service

## 2022-03-03 NOTE — Telephone Encounter (Signed)
Fax received mdINR PT/INR self testing service Test date/time 03/03/22 223 pm INR 1.5

## 2022-03-10 ENCOUNTER — Telehealth: Payer: Self-pay | Admitting: *Deleted

## 2022-03-10 ENCOUNTER — Telehealth: Payer: Self-pay | Admitting: Family Medicine

## 2022-03-10 DIAGNOSIS — Z86718 Personal history of other venous thrombosis and embolism: Secondary | ICD-10-CM

## 2022-03-10 DIAGNOSIS — I482 Chronic atrial fibrillation, unspecified: Secondary | ICD-10-CM

## 2022-03-10 DIAGNOSIS — M79642 Pain in left hand: Secondary | ICD-10-CM | POA: Diagnosis not present

## 2022-03-10 DIAGNOSIS — M25532 Pain in left wrist: Secondary | ICD-10-CM | POA: Diagnosis not present

## 2022-03-10 DIAGNOSIS — D6859 Other primary thrombophilia: Secondary | ICD-10-CM

## 2022-03-10 NOTE — Telephone Encounter (Signed)
Fax received mdINR PT/INR self testing service Test date/time 03/10/22 409 pm INR 1.4

## 2022-03-10 NOTE — Telephone Encounter (Signed)
Left detailed message on patients voicemail with detailed instructions.

## 2022-03-10 NOTE — Telephone Encounter (Signed)
INR 1.4 today, not at goal  Take 1.5 tablets today for total of 3.'75mg'$ - then 2.'5mg'$  daily except 3.'75mg'$  on M,W and F  Recheck next TUESDAY when his provider is here to address.

## 2022-03-10 NOTE — Telephone Encounter (Signed)
PT R/C 

## 2022-03-10 NOTE — Telephone Encounter (Signed)
Closing encounter, fax received will be addressed by covering provider

## 2022-03-11 NOTE — Telephone Encounter (Signed)
Followed up with patient. He is aware of his medication schedule

## 2022-03-17 DIAGNOSIS — Z01818 Encounter for other preprocedural examination: Secondary | ICD-10-CM | POA: Diagnosis not present

## 2022-03-17 DIAGNOSIS — M13842 Other specified arthritis, left hand: Secondary | ICD-10-CM | POA: Diagnosis not present

## 2022-03-17 DIAGNOSIS — M13841 Other specified arthritis, right hand: Secondary | ICD-10-CM | POA: Diagnosis not present

## 2022-03-18 ENCOUNTER — Telehealth: Payer: Self-pay | Admitting: *Deleted

## 2022-03-18 ENCOUNTER — Ambulatory Visit (INDEPENDENT_AMBULATORY_CARE_PROVIDER_SITE_OTHER): Payer: Medicare PPO | Admitting: Family Medicine

## 2022-03-18 DIAGNOSIS — Z86718 Personal history of other venous thrombosis and embolism: Secondary | ICD-10-CM

## 2022-03-18 DIAGNOSIS — I482 Chronic atrial fibrillation, unspecified: Secondary | ICD-10-CM

## 2022-03-18 DIAGNOSIS — D6859 Other primary thrombophilia: Secondary | ICD-10-CM

## 2022-03-18 LAB — POCT INR: INR: 1.9 — AB (ref 2.0–3.0)

## 2022-03-18 NOTE — Telephone Encounter (Signed)
Fax received mdINR PT/INR self testing service Test date/time 03/17/22 428 pm INR 1.9

## 2022-03-18 NOTE — Progress Notes (Signed)
INR 1.9 today, not at goal  Take 1.5 tablets today for total of 3.64m- then 2.572mdaily except 3.7528mn M,T, W and F

## 2022-03-18 NOTE — Telephone Encounter (Signed)
Patient aware and verbalizes understanding. 

## 2022-03-19 ENCOUNTER — Telehealth: Payer: Self-pay

## 2022-03-19 NOTE — Telephone Encounter (Signed)
   Pre-operative Risk Assessment    Patient Name: Christopher Avery  DOB: 1950/07/28 MRN: [id number]      Request for Surgical Clearance    Procedure:   Left thumb [facility] arthroplasty with suspension as necessary and repair as necessary . Left third and second dorsal compartment tendon synovectomy.  Date of Surgery:  Clearance 04/29/22                                 Surgeon:  Dr Roseanne Kaufman Surgeon's Group or Practice Name:  [facility] Phone number:  [phone] Fax number:  [phone] Orson Slick   Type of Clearance Requested:   - Pharmacy:  Hold Warfarin (Coumadin)     Type of Anesthesia:   Block with IV Sedation   Additional requests/questions:   n/a  Toma Deiters   03/19/2022, 10:04 AM

## 2022-03-19 NOTE — Telephone Encounter (Signed)
   Patient Name: Christopher Avery  DOB: April 12, 1950 MRN: [id number]  Primary Cardiologist: Quay Burow, MD  Chart reviewed as part of pre-operative protocol coverage.    Patient's Coumadin is managed by his PCP.  Recommendations for holding or bridging the medication will need to come from prescribing provider.   I will route this recommendation to the requesting party via Epic fax function and remove from pre-op pool.   Please call with questions.     Mable Fill, Marissa Nestle, NP 03/19/2022, 10:35 AM

## 2022-03-24 DIAGNOSIS — Z7901 Long term (current) use of anticoagulants: Secondary | ICD-10-CM | POA: Diagnosis not present

## 2022-03-24 DIAGNOSIS — I4821 Permanent atrial fibrillation: Secondary | ICD-10-CM | POA: Diagnosis not present

## 2022-03-25 ENCOUNTER — Telehealth: Payer: Self-pay | Admitting: *Deleted

## 2022-03-25 ENCOUNTER — Ambulatory Visit (INDEPENDENT_AMBULATORY_CARE_PROVIDER_SITE_OTHER): Payer: Medicare PPO | Admitting: Family Medicine

## 2022-03-25 DIAGNOSIS — D6859 Other primary thrombophilia: Secondary | ICD-10-CM

## 2022-03-25 DIAGNOSIS — I482 Chronic atrial fibrillation, unspecified: Secondary | ICD-10-CM

## 2022-03-25 DIAGNOSIS — Z86718 Personal history of other venous thrombosis and embolism: Secondary | ICD-10-CM

## 2022-03-25 LAB — POCT INR: INR: 2.5 (ref 2.0–3.0)

## 2022-03-25 NOTE — Telephone Encounter (Signed)
Patient return call. ?

## 2022-03-25 NOTE — Telephone Encounter (Signed)
Christopher Gouty, FNP  [facility] Clinical Pool1 hour ago (8:08 AM)   INR 2.5 today, at goal  Take 1.5 tablets today for total of 3.70m- then 2.565mdaily except 3.7539mn M,T, W and F   Pt is wanting to make sure of dose change is INR is at goal He is currently taking 1 daily except 3.75 on F only Please send pt a MyChart message

## 2022-03-25 NOTE — Telephone Encounter (Signed)
Lmtcb.

## 2022-03-25 NOTE — Progress Notes (Signed)
INR 2.5 today, at goal  Take 1.5 tablets today for total of 3.19m- then 2.517mdaily except 3.7536mn M,T, W and F

## 2022-03-25 NOTE — Telephone Encounter (Signed)
Fax received mdINR PT/INR self testing service Test date/time 03/24/22 838 am INR 2.5

## 2022-04-01 ENCOUNTER — Ambulatory Visit (INDEPENDENT_AMBULATORY_CARE_PROVIDER_SITE_OTHER): Payer: Medicare PPO | Admitting: Family Medicine

## 2022-04-01 ENCOUNTER — Telehealth: Payer: Self-pay | Admitting: *Deleted

## 2022-04-01 DIAGNOSIS — I482 Chronic atrial fibrillation, unspecified: Secondary | ICD-10-CM

## 2022-04-01 DIAGNOSIS — D6859 Other primary thrombophilia: Secondary | ICD-10-CM

## 2022-04-01 DIAGNOSIS — Z86718 Personal history of other venous thrombosis and embolism: Secondary | ICD-10-CM

## 2022-04-01 LAB — POCT INR: INR: 3.8 — AB (ref 2.0–3.0)

## 2022-04-01 NOTE — Progress Notes (Signed)
INR 3.8 today, not at goal  Take 1 tablet today for total of 2.5 mg- then 2.'5mg'$  daily except 3.'75mg'$  on M, W and F

## 2022-04-01 NOTE — Telephone Encounter (Signed)
Lmtcb.

## 2022-04-01 NOTE — Telephone Encounter (Signed)
Fax received mdINR PT/INR self testing service Test date/time 03/31/22 849 pm INR 3.8

## 2022-04-03 NOTE — Telephone Encounter (Signed)
Pt called in to find out his dose change from doing his INR on Monday night Talked w/ his PCP about this since he will not get enough of a change before his usual check on next Monday evening. Per PCP pt is to take 2.5 mg today, Fri, Sat & Sun 3.75 M, W & F, 2.5 mg all other days Pt is to test again next Friday before lunch Pt verbalized understanding and was writing this down as we spoke.

## 2022-04-03 NOTE — Telephone Encounter (Signed)
Christopher Avery spoke with wife.  Calendar in the computer is wrong.  Patient has been taking 1 tablet every day but 1.5 on Friday. Per michelle no changes and re check next Friday.

## 2022-04-03 NOTE — Telephone Encounter (Signed)
Spouse calls in and has ?'s about what patient is supposed to take. She thinks the blood is to thin now and if he increases it, it will be thinner.

## 2022-04-10 ENCOUNTER — Ambulatory Visit (INDEPENDENT_AMBULATORY_CARE_PROVIDER_SITE_OTHER): Payer: Medicare PPO | Admitting: Family Medicine

## 2022-04-10 ENCOUNTER — Encounter: Payer: Self-pay | Admitting: Family Medicine

## 2022-04-10 VITALS — BP 135/70 | HR 60 | Temp 97.8°F | Ht 73.0 in | Wt 164.8 lb

## 2022-04-10 DIAGNOSIS — Z01818 Encounter for other preprocedural examination: Secondary | ICD-10-CM

## 2022-04-10 DIAGNOSIS — J069 Acute upper respiratory infection, unspecified: Secondary | ICD-10-CM

## 2022-04-10 LAB — BAYER DCA HB A1C WAIVED: HB A1C (BAYER DCA - WAIVED): 7.8 % — ABNORMAL HIGH (ref 4.8–5.6)

## 2022-04-10 MED ORDER — AMOXICILLIN-POT CLAVULANATE 875-125 MG PO TABS: 1.0000 | ORAL_TABLET | Freq: Two times a day (BID) | ORAL | 0 refills | Status: AC
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
## 2022-04-11 ENCOUNTER — Other Ambulatory Visit: Payer: Medicare PPO

## 2022-04-11 ENCOUNTER — Other Ambulatory Visit: Payer: Self-pay | Admitting: Family Medicine

## 2022-04-11 ENCOUNTER — Telehealth: Payer: Self-pay | Admitting: *Deleted

## 2022-04-11 DIAGNOSIS — I482 Chronic atrial fibrillation, unspecified: Secondary | ICD-10-CM

## 2022-04-11 DIAGNOSIS — E875 Hyperkalemia: Secondary | ICD-10-CM

## 2022-04-11 DIAGNOSIS — D6859 Other primary thrombophilia: Secondary | ICD-10-CM

## 2022-04-11 DIAGNOSIS — Z86718 Personal history of other venous thrombosis and embolism: Secondary | ICD-10-CM

## 2022-04-11 LAB — CMP14+EGFR
ALT: 40 IU/L (ref 0–44)
AST: 43 IU/L — ABNORMAL HIGH (ref 0–40)
Albumin/Globulin Ratio: 2.1 (ref 1.2–2.2)
Albumin: 4.4 g/dL (ref 3.8–4.8)
Alkaline Phosphatase: 74 IU/L (ref 44–121)
BUN/Creatinine Ratio: 18 (ref 10–24)
BUN: 18 mg/dL (ref 8–27)
Bilirubin Total: 0.7 mg/dL (ref 0.0–1.2)
CO2: 24 mmol/L (ref 20–29)
Calcium: 10 mg/dL (ref 8.6–10.2)
Chloride: 103 mmol/L (ref 96–106)
Creatinine, Ser: 1.02 mg/dL (ref 0.76–1.27)
Globulin, Total: 2.1 g/dL (ref 1.5–4.5)
Glucose: 257 mg/dL — ABNORMAL HIGH (ref 70–99)
Potassium: 6.3 mmol/L (ref 3.5–5.2)
Sodium: 138 mmol/L (ref 134–144)
Total Protein: 6.5 g/dL (ref 6.0–8.5)
eGFR: 79 mL/min/{1.73_m2} (ref >59)

## 2022-04-11 LAB — CBC WITH DIFFERENTIAL/PLATELET
Basophils Absolute: 0 10*3/uL (ref 0.0–0.2)
Basos: 1 %
EOS (ABSOLUTE): 0 10*3/uL (ref 0.0–0.4)
Eos: 1 %
Hematocrit: 40 % (ref 37.5–51.0)
Hemoglobin: 13.1 g/dL (ref 13.0–17.7)
Immature Grans (Abs): 0 10*3/uL (ref 0.0–0.1)
Immature Granulocytes: 0 %
Lymphocytes Absolute: 1 10*3/uL (ref 0.7–3.1)
Lymphs: 26 %
MCH: 30.6 pg (ref 26.6–33.0)
MCHC: 32.8 g/dL (ref 31.5–35.7)
MCV: 94 fL (ref 79–97)
Monocytes Absolute: 0.3 10*3/uL (ref 0.1–0.9)
Monocytes: 7 %
Neutrophils Absolute: 2.6 10*3/uL (ref 1.4–7.0)
Neutrophils: 65 %
Platelets: 174 10*3/uL (ref 150–450)
RBC: 4.28 x10E6/uL (ref 4.14–5.80)
RDW: 11.9 % (ref 11.6–15.4)
WBC: 4 10*3/uL (ref 3.4–10.8)

## 2022-04-11 LAB — LIPID PANEL
Chol/HDL Ratio: 2.4 ratio (ref 0.0–5.0)
Cholesterol, Total: 143 mg/dL (ref 100–199)
HDL: 59 mg/dL (ref >39)
LDL Chol Calc (NIH): 74 mg/dL (ref 0–99)
Triglycerides: 41 mg/dL (ref 0–149)
VLDL Cholesterol Cal: 10 mg/dL (ref 5–40)

## 2022-04-11 LAB — PT AND PTT
INR: 1.9 — ABNORMAL HIGH (ref 0.9–1.2)
Prothrombin Time: 19.3 s — ABNORMAL HIGH (ref 9.1–12.0)
aPTT: 34 s — ABNORMAL HIGH (ref 24–33)

## 2022-04-11 LAB — THYROID PANEL WITH TSH
Free Thyroxine Index: 2 (ref 1.2–4.9)
T3 Uptake Ratio: 28 % (ref 24–39)
T4, Total: 7.1 ug/dL (ref 4.5–12.0)
TSH: 2.21 u[IU]/mL (ref 0.450–4.500)

## 2022-04-11 NOTE — Telephone Encounter (Signed)
Wife aware and verbalizes understanding per dpr.  °

## 2022-04-11 NOTE — Telephone Encounter (Signed)
Fax received mdINR PT/INR self testing service Test date/time 04/11/22 938 am INR 1.7

## 2022-04-11 NOTE — Telephone Encounter (Signed)
Attempted to reach but no answer. Coming in at 1 for labs.  Please ask him to increase coumadin by an extra half today. Then repeat INR next Friday per normal.

## 2022-04-12 LAB — BASIC METABOLIC PANEL
BUN/Creatinine Ratio: 25 — ABNORMAL HIGH (ref 10–24)
BUN: 24 mg/dL (ref 8–27)
CO2: 23 mmol/L (ref 20–29)
Calcium: 9.4 mg/dL (ref 8.6–10.2)
Chloride: 103 mmol/L (ref 96–106)
Creatinine, Ser: 0.96 mg/dL (ref 0.76–1.27)
Glucose: 130 mg/dL — ABNORMAL HIGH (ref 70–99)
Potassium: 4.8 mmol/L (ref 3.5–5.2)
Sodium: 141 mmol/L (ref 134–144)
eGFR: 85 mL/min/{1.73_m2} (ref >59)

## 2022-04-14 ENCOUNTER — Telehealth: Payer: Self-pay | Admitting: Family Medicine

## 2022-04-14 ENCOUNTER — Telehealth: Payer: Self-pay | Admitting: Cardiovascular Disease

## 2022-04-14 DIAGNOSIS — Z85828 Personal history of other malignant neoplasm of skin: Secondary | ICD-10-CM | POA: Diagnosis not present

## 2022-04-14 DIAGNOSIS — Z08 Encounter for follow-up examination after completed treatment for malignant neoplasm: Secondary | ICD-10-CM | POA: Diagnosis not present

## 2022-04-14 NOTE — Telephone Encounter (Signed)
Pt aware we didn't call him and pt voiced understanding.

## 2022-04-14 NOTE — Telephone Encounter (Signed)
Routed to pharmacy team 

## 2022-04-14 NOTE — Telephone Encounter (Signed)
Called patient and spoke with wife.  She states understanding.  She mentions that the patient was given 2 antibiotics, doxy and Keflex, depending on what is suggested.  She will reach out to [employer] office today.

## 2022-04-14 NOTE — Telephone Encounter (Signed)
Pt c/o medication issue:  1. Name of Medication:   warfarin (COUMADIN) 2.5 MG tablet    2. How are you currently taking this medication (dosage and times per day)?   3. Are you having a reaction (difficulty breathing--STAT)?   4. What is your medication issue?  Pt spouse called in stating pt is going to be prescribed doxycyline and wants to know if this will interfere with pt's coumadin.

## 2022-04-14 NOTE — Telephone Encounter (Signed)
Patient returned RN's call. 

## 2022-04-14 NOTE — Telephone Encounter (Signed)
Patients wife calling to discuss medications. Would like to speak to nurse to discuss further.

## 2022-04-14 NOTE — Telephone Encounter (Signed)
Patient stated that he had a missed call from our office but did not have a voicemail. Please call back.

## 2022-04-14 NOTE — Telephone Encounter (Signed)
Left message to call back  

## 2022-04-14 NOTE — Telephone Encounter (Signed)
Can increase bleeding risk. However recommend she contact [facility] office as they are who monitor his INR

## 2022-04-17 ENCOUNTER — Telehealth: Payer: Self-pay | Admitting: *Deleted

## 2022-04-17 ENCOUNTER — Ambulatory Visit: Payer: Self-pay | Admitting: Family Medicine

## 2022-04-17 DIAGNOSIS — Z86718 Personal history of other venous thrombosis and embolism: Secondary | ICD-10-CM

## 2022-04-17 DIAGNOSIS — D6859 Other primary thrombophilia: Secondary | ICD-10-CM

## 2022-04-17 DIAGNOSIS — I482 Chronic atrial fibrillation, unspecified: Secondary | ICD-10-CM

## 2022-04-17 LAB — POCT INR: INR: 2 (ref 2.0–3.0)

## 2022-04-17 NOTE — Telephone Encounter (Signed)
Fax received mdINR PT/INR self testing service Test date/time 04/17/22 800 am INR 2.0

## 2022-04-17 NOTE — Progress Notes (Signed)
INR 2.0, at goal, no changes to regimen.

## 2022-04-17 NOTE — Telephone Encounter (Signed)
Patient aware and verbalizes understanding. 

## 2022-04-25 ENCOUNTER — Telehealth (INDEPENDENT_AMBULATORY_CARE_PROVIDER_SITE_OTHER): Payer: Medicare PPO | Admitting: *Deleted

## 2022-04-25 DIAGNOSIS — Z86718 Personal history of other venous thrombosis and embolism: Secondary | ICD-10-CM

## 2022-04-25 DIAGNOSIS — I4821 Permanent atrial fibrillation: Secondary | ICD-10-CM | POA: Diagnosis not present

## 2022-04-25 DIAGNOSIS — D6859 Other primary thrombophilia: Secondary | ICD-10-CM

## 2022-04-25 DIAGNOSIS — Z7901 Long term (current) use of anticoagulants: Secondary | ICD-10-CM | POA: Diagnosis not present

## 2022-04-25 DIAGNOSIS — I482 Chronic atrial fibrillation, unspecified: Secondary | ICD-10-CM

## 2022-04-25 LAB — POCT INR: INR: 1.8 — AB (ref 2–3)

## 2022-04-25 NOTE — Telephone Encounter (Signed)
INR 1.8, not at goal of 2-3, no changes to regimen. Take 5 mg today, then resume previous regimen.

## 2022-04-25 NOTE — Telephone Encounter (Signed)
Patient aware and verbalizes understanding.  States that he had to stop warfarin due to surgery on Tuesday.  He states he is stopped it yesterday 3/21 and will resume after surgery on Tuesday.

## 2022-04-25 NOTE — Telephone Encounter (Signed)
I wasn't aware of this. He can resume his regular schedule after his surgery.

## 2022-04-25 NOTE — Telephone Encounter (Signed)
Patient aware and verbalizes understanding. 

## 2022-04-25 NOTE — Telephone Encounter (Signed)
Fax received [facility]INR PT/INR self testing service Test date/time 04/25/22 644 am INR 1.8

## 2022-04-29 DIAGNOSIS — M24032 Loose body in left wrist: Secondary | ICD-10-CM | POA: Diagnosis not present

## 2022-04-29 DIAGNOSIS — M1812 Unilateral primary osteoarthritis of first carpometacarpal joint, left hand: Secondary | ICD-10-CM | POA: Diagnosis not present

## 2022-04-29 DIAGNOSIS — M19032 Primary osteoarthritis, left wrist: Secondary | ICD-10-CM | POA: Diagnosis not present

## 2022-04-29 DIAGNOSIS — M65832 Other synovitis and tenosynovitis, left forearm: Secondary | ICD-10-CM | POA: Diagnosis not present

## 2022-04-29 DIAGNOSIS — M13132 Monoarthritis, not elsewhere classified, left wrist: Secondary | ICD-10-CM | POA: Diagnosis not present

## 2022-05-05 ENCOUNTER — Telehealth: Payer: Self-pay

## 2022-05-05 DIAGNOSIS — D6859 Other primary thrombophilia: Secondary | ICD-10-CM

## 2022-05-05 DIAGNOSIS — I482 Chronic atrial fibrillation, unspecified: Secondary | ICD-10-CM

## 2022-05-05 DIAGNOSIS — Z86718 Personal history of other venous thrombosis and embolism: Secondary | ICD-10-CM

## 2022-05-05 NOTE — Telephone Encounter (Signed)
Pt has been informed and understood. States that he checks his INR's on Friday's now.

## 2022-05-05 NOTE — Telephone Encounter (Signed)
Fax received mdINR PT/INR self testing service Test date/time [id number] pm INR 1.0

## 2022-05-05 NOTE — Telephone Encounter (Signed)
INR 1.0, not at goal of 2-3,  COUMADIN 2.5MG  DAILY EXCEPT 5MG  ON TUES AND THURS  PLEASE TELL PATIENTTO CHECK inr ON TUESDAY SO THAT HER PCP CAN SEE RESULTS. HER PCP IS NOT HERE ON MONDAYS.

## 2022-05-09 ENCOUNTER — Ambulatory Visit: Payer: Self-pay | Admitting: Family Medicine

## 2022-05-09 ENCOUNTER — Telehealth: Payer: Self-pay | Admitting: *Deleted

## 2022-05-09 DIAGNOSIS — Z86718 Personal history of other venous thrombosis and embolism: Secondary | ICD-10-CM

## 2022-05-09 DIAGNOSIS — D6859 Other primary thrombophilia: Secondary | ICD-10-CM

## 2022-05-09 DIAGNOSIS — I482 Chronic atrial fibrillation, unspecified: Secondary | ICD-10-CM

## 2022-05-09 LAB — POCT INR: INR: 1.9 — AB (ref 2.0–3.0)

## 2022-05-09 NOTE — Telephone Encounter (Signed)
Fax received mdINR PT/INR self testing service Test date/time 05/09/22 927 am INR 1.9

## 2022-05-09 NOTE — Progress Notes (Signed)
INR 1.9, not at goal of 2-3, no changes, continue below: COUMADIN 2.5MG  DAILY EXCEPT 5MG  ON TUES AND THURS Repeat in one week and will adjust further if warranted.

## 2022-05-09 NOTE — Telephone Encounter (Signed)
Left vm for cb

## 2022-05-09 NOTE — Telephone Encounter (Signed)
Pt has been notified.

## 2022-05-09 NOTE — Telephone Encounter (Signed)
Patient return call. ?

## 2022-05-14 DIAGNOSIS — Z4789 Encounter for other orthopedic aftercare: Secondary | ICD-10-CM | POA: Diagnosis not present

## 2022-05-14 DIAGNOSIS — M1812 Unilateral primary osteoarthritis of first carpometacarpal joint, left hand: Secondary | ICD-10-CM | POA: Diagnosis not present

## 2022-05-16 ENCOUNTER — Telehealth: Payer: Self-pay | Admitting: *Deleted

## 2022-05-16 ENCOUNTER — Ambulatory Visit: Payer: Self-pay | Admitting: Family Medicine

## 2022-05-16 DIAGNOSIS — D6859 Other primary thrombophilia: Secondary | ICD-10-CM

## 2022-05-16 DIAGNOSIS — I482 Chronic atrial fibrillation, unspecified: Secondary | ICD-10-CM

## 2022-05-16 DIAGNOSIS — Z86718 Personal history of other venous thrombosis and embolism: Secondary | ICD-10-CM

## 2022-05-16 LAB — POCT INR: INR: 2.9 (ref 2.0–3.0)

## 2022-05-16 NOTE — Progress Notes (Signed)
INR 2.9, at goal. Continue current regimen.  COUMADIN 2.5MG  DAILY EXCEPT 5MG  ON TUES AND THURS Repeat in one week and will adjust further if warranted.

## 2022-05-16 NOTE — Telephone Encounter (Signed)
Lmtcb.

## 2022-05-16 NOTE — Telephone Encounter (Signed)
Per DPR, patient notified via voicemail. 

## 2022-05-16 NOTE — Telephone Encounter (Signed)
Aware to repeat in one week. Patient wants to talk to nurse.

## 2022-05-16 NOTE — Telephone Encounter (Signed)
Fax received mdINR PT/INR self testing service Test date/time 05/16/22 927 am INR 2.9

## 2022-05-18 DIAGNOSIS — E1165 Type 2 diabetes mellitus with hyperglycemia: Secondary | ICD-10-CM | POA: Diagnosis not present

## 2022-05-18 NOTE — Patient Instructions (Incomplete)
[name] , Thank you for taking time to come for your Medicare Wellness Visit. I appreciate your ongoing commitment to your health goals. Please review the following plan we discussed and let me know if I can assist you in the future.   These are the goals we discussed:  Goals      Patient Stated     Continue healthy diet and activity regimen     Remain active and independent        This is a list of the screening recommended for you and due dates:  Health Maintenance  Topic Date Due   Pneumonia Vaccine (3 of 3 - PPSV23 or PCV20) 04/30/2021   Colon Cancer Screening  09/19/2021   Yearly kidney health urinalysis for diabetes  12/20/2021   Flu Shot  09/04/2022   Yearly kidney function blood test for diabetes  04/11/2023   Medicare Annual Wellness Visit  05/19/2023   DTaP/Tdap/Td vaccine (3 - Td or Tdap) 07/11/2024   Hepatitis C Screening: USPSTF Recommendation to screen - Ages 57-79 yo.  Completed   Zoster (Shingles) Vaccine  Completed   HPV Vaccine  Aged Out   Complete foot exam   Discontinued   Hemoglobin A1C  Discontinued   Eye exam for diabetics  Discontinued   COVID-19 Vaccine  Discontinued    Advanced directives: Please bring a copy of your health care power of attorney and living will to the office to be added to your chart at your convenience.   Conditions/risks identified: Aim for 30 minutes of exercise or brisk walking, 6-8 glasses of water, and 5 servings of fruits and vegetables each day.   Next appointment: Follow up in one year for your annual wellness visit.   Preventive Care 14 Years and Older, Male  Preventive care refers to lifestyle choices and visits with your health care provider that can promote health and wellness. What does preventive care include? A yearly physical exam. This is also called an annual well check. Dental exams once or twice a year. Routine eye exams. Ask your health care provider how often you should have your eyes checked. Personal  lifestyle choices, including: Daily care of your teeth and gums. Regular physical activity. Eating a healthy diet. Avoiding tobacco and drug use. Limiting alcohol use. Practicing safe sex. Taking low doses of aspirin every day. Taking vitamin and mineral supplements as recommended by your health care provider. What happens during an annual well check? The services and screenings done by your health care provider during your annual well check will depend on your age, overall health, lifestyle risk factors, and family history of disease. Counseling  Your health care provider may ask you questions about your: Alcohol use. Tobacco use. Drug use. Emotional well-being. Home and relationship well-being. Sexual activity. Eating habits. History of falls. Memory and ability to understand (cognition). Work and work Astronomer. Screening  You may have the following tests or measurements: Height, weight, and BMI. Blood pressure. Lipid and cholesterol levels. These may be checked every 5 years, or more frequently if you are over 53 years old. Skin check. Lung cancer screening. You may have this screening every year starting at age 72 if you have a 30-pack-year history of smoking and currently smoke or have quit within the past 15 years. Fecal occult blood test (FOBT) of the stool. You may have this test every year starting at age 36. Flexible sigmoidoscopy or colonoscopy. You may have a sigmoidoscopy every 5 years or a colonoscopy every  10 years starting at age 61. Prostate cancer screening. Recommendations will vary depending on your family history and other risks. Hepatitis C blood test. Hepatitis B blood test. Sexually transmitted disease (STD) testing. Diabetes screening. This is done by checking your blood sugar (glucose) after you have not eaten for a while (fasting). You may have this done every 1-3 years. Abdominal aortic aneurysm (AAA) screening. You may need this if you are a  current or former smoker. Osteoporosis. You may be screened starting at age 75 if you are at high risk. Talk with your health care provider about your test results, treatment options, and if necessary, the need for more tests. Vaccines  Your health care provider may recommend certain vaccines, such as: Influenza vaccine. This is recommended every year. Tetanus, diphtheria, and acellular pertussis (Tdap, Td) vaccine. You may need a Td booster every 10 years. Zoster vaccine. You may need this after age 53. Pneumococcal 13-valent conjugate (PCV13) vaccine. One dose is recommended after age 76. Pneumococcal polysaccharide (PPSV23) vaccine. One dose is recommended after age 82. Talk to your health care provider about which screenings and vaccines you need and how often you need them. This information is not intended to replace advice given to you by your health care provider. Make sure you discuss any questions you have with your health care provider. Document Released: 02/16/2015 Document Revised: 10/10/2015 Document Reviewed: 11/21/2014 Elsevier Interactive Patient Education  2017 [employer].  Fall Prevention in the Home Falls can cause injuries. They can happen to people of all ages. There are many things you can do to make your home safe and to help prevent falls. What can I do on the outside of my home? Regularly fix the edges of walkways and driveways and fix any cracks. Remove anything that might make you trip as you walk through a door, such as a raised step or threshold. Trim any bushes or trees on the path to your home. Use bright outdoor lighting. Clear any walking paths of anything that might make someone trip, such as rocks or tools. Regularly check to see if handrails are loose or broken. Make sure that both sides of any steps have handrails. Any raised decks and porches should have guardrails on the edges. Have any leaves, snow, or ice cleared regularly. Use sand or salt on  walking paths during winter. Clean up any spills in your garage right away. This includes oil or grease spills. What can I do in the bathroom? Use night lights. Install grab bars by the toilet and in the tub and shower. Do not use towel bars as grab bars. Use non-skid mats or decals in the tub or shower. If you need to sit down in the shower, use a plastic, non-slip stool. Keep the floor dry. Clean up any water that spills on the floor as soon as it happens. Remove soap buildup in the tub or shower regularly. Attach bath mats securely with double-sided non-slip rug tape. Do not have throw rugs and other things on the floor that can make you trip. What can I do in the bedroom? Use night lights. Make sure that you have a light by your bed that is easy to reach. Do not use any sheets or blankets that are too big for your bed. They should not hang down onto the floor. Have a firm chair that has side arms. You can use this for support while you get dressed. Do not have throw rugs and other things on the  floor that can make you trip. What can I do in the kitchen? Clean up any spills right away. Avoid walking on wet floors. Keep items that you use a lot in easy-to-reach places. If you need to reach something above you, use a strong step stool that has a grab bar. Keep electrical cords out of the way. Do not use floor polish or wax that makes floors slippery. If you must use wax, use non-skid floor wax. Do not have throw rugs and other things on the floor that can make you trip. What can I do with my stairs? Do not leave any items on the stairs. Make sure that there are handrails on both sides of the stairs and use them. Fix handrails that are broken or loose. Make sure that handrails are as long as the stairways. Check any carpeting to make sure that it is firmly attached to the stairs. Fix any carpet that is loose or worn. Avoid having throw rugs at the top or bottom of the stairs. If you do  have throw rugs, attach them to the floor with carpet tape. Make sure that you have a light switch at the top of the stairs and the bottom of the stairs. If you do not have them, ask someone to add them for you. What else can I do to help prevent falls? Wear shoes that: Do not have high heels. Have rubber bottoms. Are comfortable and fit you well. Are closed at the toe. Do not wear sandals. If you use a stepladder: Make sure that it is fully opened. Do not climb a closed stepladder. Make sure that both sides of the stepladder are locked into place. Ask someone to hold it for you, if possible. Clearly mark and make sure that you can see: Any grab bars or handrails. First and last steps. Where the edge of each step is. Use tools that help you move around (mobility aids) if they are needed. These include: Canes. Walkers. Scooters. Crutches. Turn on the lights when you go into a dark area. Replace any light bulbs as soon as they burn out. Set up your furniture so you have a clear path. Avoid moving your furniture around. If any of your floors are uneven, fix them. If there are any pets around you, be aware of where they are. Review your medicines with your doctor. Some medicines can make you feel dizzy. This can increase your chance of falling. Ask your doctor what other things that you can do to help prevent falls. This information is not intended to replace advice given to you by your health care provider. Make sure you discuss any questions you have with your health care provider. Document Released: 11/16/2008 Document Revised: 06/28/2015 Document Reviewed: 02/24/2014 Elsevier Interactive Patient Education  2017 [employer].
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
## 2022-05-19 ENCOUNTER — Ambulatory Visit (INDEPENDENT_AMBULATORY_CARE_PROVIDER_SITE_OTHER): Payer: Medicare PPO

## 2022-05-19 VITALS — Ht 73.0 in | Wt 164.0 lb

## 2022-05-19 DIAGNOSIS — Z0000 Encounter for general adult medical examination without abnormal findings: Secondary | ICD-10-CM | POA: Diagnosis not present

## 2022-05-19 DIAGNOSIS — Z1211 Encounter for screening for malignant neoplasm of colon: Secondary | ICD-10-CM | POA: Diagnosis not present

## 2022-05-19 NOTE — Telephone Encounter (Signed)
I called pt.  He had surgery and last INR therapeutic after adjustment of regimen.  Ok to resume normal dosing schedule of 1 tablet daily with 1.5 tablets on Friday and check weekly INR.

## 2022-05-19 NOTE — Telephone Encounter (Signed)
Patient aware and states that he has been taking 2.5mg  daily except 1 1/2 pills on Friday.  He is questing the does that she advised to continue. Since it is not the same as he is currently on.  Covering PCP please advise . Please advise and send to pool.

## 2022-05-19 NOTE — Telephone Encounter (Signed)
Lmtcb  What are patients questions?

## 2022-05-24 DIAGNOSIS — I4821 Permanent atrial fibrillation: Secondary | ICD-10-CM | POA: Diagnosis not present

## 2022-05-24 DIAGNOSIS — Z7901 Long term (current) use of anticoagulants: Secondary | ICD-10-CM | POA: Diagnosis not present

## 2022-05-26 ENCOUNTER — Telehealth: Payer: Self-pay | Admitting: *Deleted

## 2022-05-26 DIAGNOSIS — D6859 Other primary thrombophilia: Secondary | ICD-10-CM

## 2022-05-26 DIAGNOSIS — Z86718 Personal history of other venous thrombosis and embolism: Secondary | ICD-10-CM

## 2022-05-26 DIAGNOSIS — I482 Chronic atrial fibrillation, unspecified: Secondary | ICD-10-CM

## 2022-05-26 NOTE — Telephone Encounter (Signed)
Lmtcb.

## 2022-05-26 NOTE — Telephone Encounter (Signed)
Fax received mdINR PT/INR self testing service Test date/time 05/24/22 813 am INR 2.2

## 2022-05-26 NOTE — Telephone Encounter (Signed)
Continue coumadin as is °

## 2022-05-27 NOTE — Telephone Encounter (Signed)
Pt aware to continue current coumadin 

## 2022-05-30 ENCOUNTER — Ambulatory Visit: Payer: Self-pay | Admitting: Family Medicine

## 2022-05-30 ENCOUNTER — Telehealth: Payer: Self-pay | Admitting: Family Medicine

## 2022-05-30 DIAGNOSIS — Z86718 Personal history of other venous thrombosis and embolism: Secondary | ICD-10-CM

## 2022-05-30 DIAGNOSIS — I482 Chronic atrial fibrillation, unspecified: Secondary | ICD-10-CM

## 2022-05-30 DIAGNOSIS — D6859 Other primary thrombophilia: Secondary | ICD-10-CM

## 2022-05-30 LAB — POCT INR: INR: 1.8 — AB (ref 2.0–3.0)

## 2022-05-30 NOTE — Telephone Encounter (Signed)
Patient states that no changes in medication.  He was recently on a trip and was eating stuff he shouldn't of.

## 2022-05-30 NOTE — Progress Notes (Signed)
INR 1.8, any recent changes to diet or medications?

## 2022-05-30 NOTE — Telephone Encounter (Signed)
Fax received mdINR PT/INR self testing service Test date/time 05/30/22 8:41am INR 1.8

## 2022-05-30 NOTE — Telephone Encounter (Signed)
Patient aware and verbalizes understanding. 

## 2022-06-02 DIAGNOSIS — M79645 Pain in left finger(s): Secondary | ICD-10-CM | POA: Diagnosis not present

## 2022-06-02 DIAGNOSIS — M79644 Pain in right finger(s): Secondary | ICD-10-CM | POA: Diagnosis not present

## 2022-06-02 DIAGNOSIS — M1812 Unilateral primary osteoarthritis of first carpometacarpal joint, left hand: Secondary | ICD-10-CM | POA: Diagnosis not present

## 2022-06-02 DIAGNOSIS — Z4789 Encounter for other orthopedic aftercare: Secondary | ICD-10-CM | POA: Diagnosis not present

## 2022-06-02 DIAGNOSIS — M25642 Stiffness of left hand, not elsewhere classified: Secondary | ICD-10-CM | POA: Diagnosis not present

## 2022-06-03 ENCOUNTER — Ambulatory Visit: Payer: Self-pay | Admitting: Family Medicine

## 2022-06-03 ENCOUNTER — Telehealth: Payer: Self-pay | Admitting: *Deleted

## 2022-06-03 DIAGNOSIS — D6859 Other primary thrombophilia: Secondary | ICD-10-CM

## 2022-06-03 DIAGNOSIS — I482 Chronic atrial fibrillation, unspecified: Secondary | ICD-10-CM

## 2022-06-03 DIAGNOSIS — Z86718 Personal history of other venous thrombosis and embolism: Secondary | ICD-10-CM

## 2022-06-03 LAB — POCT INR: INR: 2.2 (ref 2.0–3.0)

## 2022-06-03 NOTE — Telephone Encounter (Signed)
Fax received mdINR PT/INR self testing service Test date/time 4/430/24 837 am INR 2.2

## 2022-06-03 NOTE — Progress Notes (Signed)
INR at goal; continue current regimen.

## 2022-06-03 NOTE — Telephone Encounter (Signed)
Patient aware and verbalizes understanding.  Patient is taking on pill every day but Friday.  Taking 1 1/2 pill on Friday.

## 2022-06-04 LAB — FECAL OCCULT BLOOD, IMMUNOCHEMICAL: IFOBT: NEGATIVE

## 2022-06-09 DIAGNOSIS — M25642 Stiffness of left hand, not elsewhere classified: Secondary | ICD-10-CM | POA: Diagnosis not present

## 2022-06-09 DIAGNOSIS — M1812 Unilateral primary osteoarthritis of first carpometacarpal joint, left hand: Secondary | ICD-10-CM | POA: Diagnosis not present

## 2022-06-11 ENCOUNTER — Ambulatory Visit: Payer: Self-pay | Admitting: Family Medicine

## 2022-06-11 ENCOUNTER — Telehealth: Payer: Self-pay | Admitting: *Deleted

## 2022-06-11 DIAGNOSIS — Z86718 Personal history of other venous thrombosis and embolism: Secondary | ICD-10-CM

## 2022-06-11 DIAGNOSIS — D6859 Other primary thrombophilia: Secondary | ICD-10-CM

## 2022-06-11 DIAGNOSIS — I482 Chronic atrial fibrillation, unspecified: Secondary | ICD-10-CM

## 2022-06-11 LAB — POCT INR: INR: 2.2 (ref 2.0–3.0)

## 2022-06-11 NOTE — Progress Notes (Signed)
INR normal. Continue current regimen.

## 2022-06-11 NOTE — Telephone Encounter (Signed)
Fax received mdINR PT/INR self testing service Test date/time 06/10/22 303 pm INR 2.2

## 2022-06-11 NOTE — Telephone Encounter (Signed)
Patient aware and verbalizes understanding. 

## 2022-06-16 DIAGNOSIS — M1812 Unilateral primary osteoarthritis of first carpometacarpal joint, left hand: Secondary | ICD-10-CM | POA: Diagnosis not present

## 2022-06-16 DIAGNOSIS — M25642 Stiffness of left hand, not elsewhere classified: Secondary | ICD-10-CM | POA: Diagnosis not present

## 2022-06-18 ENCOUNTER — Encounter: Payer: Self-pay | Admitting: Cardiovascular Disease

## 2022-06-18 ENCOUNTER — Ambulatory Visit: Payer: Medicare PPO | Attending: Cardiovascular Disease | Admitting: Cardiovascular Disease

## 2022-06-18 ENCOUNTER — Telehealth: Payer: Self-pay | Admitting: *Deleted

## 2022-06-18 VITALS — BP 126/68 | HR 71 | Ht 69.0 in | Wt 158.6 lb

## 2022-06-18 DIAGNOSIS — Z794 Long term (current) use of insulin: Secondary | ICD-10-CM

## 2022-06-18 DIAGNOSIS — D6859 Other primary thrombophilia: Secondary | ICD-10-CM

## 2022-06-18 DIAGNOSIS — I10 Essential (primary) hypertension: Secondary | ICD-10-CM

## 2022-06-18 DIAGNOSIS — Z86718 Personal history of other venous thrombosis and embolism: Secondary | ICD-10-CM

## 2022-06-18 DIAGNOSIS — I482 Chronic atrial fibrillation, unspecified: Secondary | ICD-10-CM

## 2022-06-18 DIAGNOSIS — E1169 Type 2 diabetes mellitus with other specified complication: Secondary | ICD-10-CM | POA: Diagnosis not present

## 2022-06-18 DIAGNOSIS — E785 Hyperlipidemia, unspecified: Secondary | ICD-10-CM | POA: Diagnosis not present

## 2022-06-18 DIAGNOSIS — E7800 Pure hypercholesterolemia, unspecified: Secondary | ICD-10-CM

## 2022-06-18 NOTE — Assessment & Plan Note (Signed)
History of chronic A-fib rate controlled on Coumadin anticoagulation.  He checks his INR at home.

## 2022-06-18 NOTE — Telephone Encounter (Signed)
  Description   INR 1.7 Take 5mg  today. Resume normal schedule tomorrow (5 mg Tues/Thurs, 2.5mg  every other day). Repeat INR 1 week.

## 2022-06-18 NOTE — Patient Instructions (Addendum)
Medication Instructions:  Your physician recommends that you continue on your current medications as directed. Please refer to the Current Medication list given to you today.  *If you need a refill on your cardiac medications before your next appointment, please call your pharmacy*   Follow-Up: At [facility], you and your health needs are our priority.  As part of our continuing mission to provide you with exceptional heart care, we have created designated Provider Care Teams.  These Care Teams include your primary Cardiologist (physician) and Advanced Practice Providers (APPs -  Physician Assistants and Nurse Practitioners) who all work together to provide you with the care you need, when you need it.  We recommend signing up for the patient portal called "MyChart".  Sign up information is provided on this After Visit Summary.  MyChart is used to connect with patients for Virtual Visits (Telemedicine).  Patients are able to view lab/test results, encounter notes, upcoming appointments, etc.  Non-urgent messages can be sent to your provider as well.   To learn more about what you can do with MyChart, go to [email or web address].    Your next appointment:   12 month(s)  Provider:   Jonathan Berry, MD    

## 2022-06-18 NOTE — Assessment & Plan Note (Signed)
History of essential hypertension blood pressure measured today at 126/68.  He is on diltiazem to 40.

## 2022-06-18 NOTE — Telephone Encounter (Signed)
Fax received mdINR PT/INR self testing service Test date/time 06/17/22 1005 pm INR 1.7

## 2022-06-18 NOTE — Assessment & Plan Note (Signed)
History of hyperlipidemia on statin therapy with lipid profile performed 04/10/2022 revealing total cholesterol 143, LDL of 74 and HDL 59.

## 2022-06-18 NOTE — Progress Notes (Signed)
06/18/2022 [name]   [birth year]  [id number]  Primary Physician Christopher Avery, Christopher Albino, FNP Primary Cardiologist: Christopher Gess MD Christopher Avery, [place], [place]  HPI:  Christopher Avery is a 72 y.o.    thin and fit-appearing married Caucasian male, father of 2, grandfather of 4 grandchildren, who I saw 05/30/2021.[name] His mother-in-law, Christopher Avery, was a patient of mine for a long time and died 2016/06/28.  He is accompanied by his wife Christopher Avery today, daughter of Christopher Avery. He has a history of paroxysmal atrial fibrillation, maintaining sinus rhythm on Coumadin anticoagulation  His other problems include hypertension, hyperlipidemia, and family history of heart disease.    Since I saw him a year ago he continues to do well.  He remains in chronic A-fib rate controlled on Coumadin anticoagulation.  He checks his INR at home.  He denies chest pain or shortness of breath.  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  They are about to get Dr. Kathee Avery but keep her petite Christopher Avery. .   Current Meds  Medication Sig   atorvastatin (LIPITOR) 20 MG tablet TAKE 1 TABLET BY MOUTH EVERY DAY (Patient taking differently: Take 20 mg by mouth daily. Taking every other day)   Cholecalciferol (VITAMIN D3) 125 MCG (5000 UT) CAPS Take 1 capsule by mouth daily. Takes 6 days a week   co-enzyme Q-10 30 MG capsule Take 100 mg by mouth daily.   Continuous Blood Gluc Sensor (FREESTYLE LIBRE 2 SENSOR) MISC by Does not apply route.   diltiazem (CARDIZEM CD) 240 MG 24 hr capsule TAKE 1 CAPSULE BY MOUTH EVERY DAY   fluticasone (FLONASE) 50 MCG/ACT nasal spray SPRAY 2 SPRAYS INTO EACH NOSTRIL EVERY DAY (Patient taking differently: as needed.)   Insulin Glargine (BASAGLAR KWIKPEN) 100 UNIT/ML Inject 19 Units into the skin daily.   insulin lispro (HUMALOG KWIKPEN) 200 UNIT/ML KwikPen Inject 16 Units into the skin 3 (three) times daily with meals.   Insulin Pen Needle (BD PEN NEEDLE NANO  2ND GEN) 32G X 4 MM MISC USE WITH INSULIN 4 TIMES A DAY DX E11.9   tadalafil (CIALIS) 20 MG tablet Take 0.5-1 tablets (10-20 mg total) by mouth every other day as needed for erectile dysfunction.   valACYclovir (VALTREX) 500 MG tablet TAKE 4 TABLETS BY MOUTH TWICE DAILY AS DIRECTED AND AS NEEDED   warfarin (COUMADIN) 2.5 MG tablet TAKE 1 TO 1 AND 1/2 TABLETS DAILY AS DIRECTED     Allergies  Allergen Reactions   Januvia [Sitagliptin] Hives    Social History   Socioeconomic History   Marital status: Married    Spouse name: Christopher Avery   Number of children: 2   Years of education: 14 years   Highest education level: Some college, no degree  Occupational History   Occupation: Retired    Associate Professor: [employer]  Tobacco Use   Smoking status: Never   Smokeless tobacco: Never  Vaping Use   Vaping Use: Never used  Substance and Sexual Activity   Alcohol use: Never    Alcohol/week: 0.0 standard drinks of alcohol   Drug use: No   Sexual activity: Yes  Other Topics Concern   Not on file  Social History Narrative   Lives with wife.   Social Determinants of Health   Financial Resource Strain: Low Risk  (05/19/2022)   Overall Financial Resource Strain (CARDIA)    Difficulty of Paying Living Expenses: Not hard at all  Food Insecurity: No Food Insecurity (05/19/2022)   Hunger Vital Sign    Worried About Running Out of Food in the Last Year: Never true    Ran Out of Food in the Last Year: Never true  Transportation Needs: No Transportation Needs (05/19/2022)   PRAPARE - Administrator, Civil Service (Medical): No    Lack of Transportation (Non-Medical): No  Physical Activity: Sufficiently Active (05/19/2022)   Exercise Vital Sign    Days of Exercise per Week: 5 days    Minutes of Exercise per Session: 30 min  Stress: No Stress Concern Present (05/19/2022)   Christopher Avery of Occupational Health - Occupational Stress Questionnaire    Feeling of Stress : Not at all  Social  Connections: Socially Integrated (05/19/2022)   Social Connection and Isolation Panel [NHANES]    Frequency of Communication with Friends and Family: More than three times a week    Frequency of Social Gatherings with Friends and Family: More than three times a week    Attends Religious Services: More than 4 times per year    Active Member of [employer] or Organizations: Yes    Attends Engineer, structural: More than 4 times per year    Marital Status: Married  Catering manager Violence: Not At Risk (05/19/2022)   Humiliation, Afraid, Rape, and Kick questionnaire    Fear of Current or Ex-Partner: No    Emotionally Abused: No    Physically Abused: No    Sexually Abused: No     Review of Systems: General: negative for chills, fever, night sweats or weight changes.  Cardiovascular: negative for chest pain, dyspnea on exertion, edema, orthopnea, palpitations, paroxysmal nocturnal dyspnea or shortness of breath Dermatological: negative for rash Respiratory: negative for cough or wheezing Urologic: negative for hematuria Abdominal: negative for nausea, vomiting, diarrhea, bright red blood per rectum, melena, or hematemesis Neurologic: negative for visual changes, syncope, or dizziness All other systems reviewed and are otherwise negative except as noted above.    Blood pressure 126/68, pulse 71, height 5\' 9"  (1.753 m), weight 158 lb 9.6 oz (71.9 kg), SpO2 98 %.  General appearance: alert and no distress Neck: no adenopathy, no JVD, supple, symmetrical, trachea midline, thyroid not enlarged, symmetric, no tenderness/mass/nodules, and left carotid bruit Lungs: clear to auscultation bilaterally Heart: irregularly irregular rhythm Extremities: extremities normal, atraumatic, no cyanosis or edema Pulses: 2+ and symmetric Skin: Skin color, texture, turgor normal. No rashes or lesions Neurologic: Grossly normal  EKG atrial fibrillation with a ventricular sponsor 71, occasional aberrantly  conducted beats, septal Q waves.  I personally reviewed this EKG.  ASSESSMENT AND PLAN:   Hyperlipidemia associated with type 2 diabetes mellitus (HCC) History of hyperlipidemia on statin therapy with lipid profile performed 04/10/2022 revealing total cholesterol 143, LDL of 74 and HDL 59.  Benign essential HTN History of essential hypertension blood pressure measured today at 126/68.  He is on diltiazem to 40.  Chronic atrial fibrillation (HCC) History of chronic A-fib rate controlled on Coumadin anticoagulation.  He checks his INR at home.     Christopher Gess MD FACP,FACC,FAHA, [facility] 06/18/2022 2:27 PM

## 2022-06-20 ENCOUNTER — Telehealth: Payer: Self-pay | Admitting: *Deleted

## 2022-06-20 DIAGNOSIS — D6859 Other primary thrombophilia: Secondary | ICD-10-CM

## 2022-06-20 DIAGNOSIS — Z86718 Personal history of other venous thrombosis and embolism: Secondary | ICD-10-CM

## 2022-06-20 DIAGNOSIS — Z7901 Long term (current) use of anticoagulants: Secondary | ICD-10-CM | POA: Diagnosis not present

## 2022-06-20 DIAGNOSIS — I482 Chronic atrial fibrillation, unspecified: Secondary | ICD-10-CM

## 2022-06-20 DIAGNOSIS — I4821 Permanent atrial fibrillation: Secondary | ICD-10-CM | POA: Diagnosis not present

## 2022-06-20 NOTE — Telephone Encounter (Signed)
Fax received mdINR PT/INR self testing service Test date/time 06/20/22 133 am INR 1.7

## 2022-06-20 NOTE — Telephone Encounter (Signed)
Description   INR 1.7 Take 5mg  today. Then start 5 mg on Saturday, Tuesday, and thrusday, 2.5 mg on all other days as new schedule. Repeat INR 1 week.

## 2022-06-23 ENCOUNTER — Other Ambulatory Visit: Payer: Self-pay

## 2022-06-23 DIAGNOSIS — M1812 Unilateral primary osteoarthritis of first carpometacarpal joint, left hand: Secondary | ICD-10-CM | POA: Diagnosis not present

## 2022-06-23 DIAGNOSIS — M25642 Stiffness of left hand, not elsewhere classified: Secondary | ICD-10-CM | POA: Diagnosis not present

## 2022-06-24 ENCOUNTER — Telehealth: Payer: Self-pay | Admitting: *Deleted

## 2022-06-24 ENCOUNTER — Ambulatory Visit: Payer: Self-pay | Admitting: Family Medicine

## 2022-06-24 DIAGNOSIS — Z86718 Personal history of other venous thrombosis and embolism: Secondary | ICD-10-CM

## 2022-06-24 DIAGNOSIS — D6859 Other primary thrombophilia: Secondary | ICD-10-CM

## 2022-06-24 DIAGNOSIS — I482 Chronic atrial fibrillation, unspecified: Secondary | ICD-10-CM

## 2022-06-24 LAB — POCT INR: INR: 1.8 — AB (ref 2.0–3.0)

## 2022-06-24 NOTE — Telephone Encounter (Signed)
Fax received mdINR PT/INR self testing service Test date/time 06/24/22 700 am INR 1.8

## 2022-06-24 NOTE — Progress Notes (Signed)
INR 1.8 Take 5mg  today. Then start 5 mg on Saturday, Tuesday, and Thrusday, 2.5 mg on all other days as new schedule. Repeat INR 1 week.

## 2022-06-24 NOTE — Telephone Encounter (Signed)
Patient aware and verbalizes understanding.  Would like it sent to [facility] as well.  Sent

## 2022-06-25 ENCOUNTER — Other Ambulatory Visit: Payer: Self-pay | Admitting: Cardiovascular Disease

## 2022-06-26 DIAGNOSIS — M1812 Unilateral primary osteoarthritis of first carpometacarpal joint, left hand: Secondary | ICD-10-CM | POA: Diagnosis not present

## 2022-06-26 DIAGNOSIS — M25532 Pain in left wrist: Secondary | ICD-10-CM | POA: Diagnosis not present

## 2022-06-26 DIAGNOSIS — M79644 Pain in right finger(s): Secondary | ICD-10-CM | POA: Diagnosis not present

## 2022-06-26 DIAGNOSIS — Z4789 Encounter for other orthopedic aftercare: Secondary | ICD-10-CM | POA: Diagnosis not present

## 2022-06-26 DIAGNOSIS — M79645 Pain in left finger(s): Secondary | ICD-10-CM | POA: Diagnosis not present

## 2022-07-01 DIAGNOSIS — M25642 Stiffness of left hand, not elsewhere classified: Secondary | ICD-10-CM | POA: Diagnosis not present

## 2022-07-01 DIAGNOSIS — M1812 Unilateral primary osteoarthritis of first carpometacarpal joint, left hand: Secondary | ICD-10-CM | POA: Diagnosis not present

## 2022-07-03 ENCOUNTER — Telehealth: Payer: Self-pay | Admitting: *Deleted

## 2022-07-03 ENCOUNTER — Ambulatory Visit: Payer: Self-pay | Admitting: Family Medicine

## 2022-07-03 DIAGNOSIS — D6859 Other primary thrombophilia: Secondary | ICD-10-CM

## 2022-07-03 DIAGNOSIS — I482 Chronic atrial fibrillation, unspecified: Secondary | ICD-10-CM

## 2022-07-03 DIAGNOSIS — Z86718 Personal history of other venous thrombosis and embolism: Secondary | ICD-10-CM

## 2022-07-03 LAB — POCT INR: INR: 1.8 — AB (ref 2.0–3.0)

## 2022-07-03 NOTE — Telephone Encounter (Signed)
Fax received mdINR PT/INR self testing service Test date/time 07/01/22 611 am INR 1.8

## 2022-07-03 NOTE — Telephone Encounter (Signed)
Called patient with instructions and he stated that he did his INR early  Tuesday morning and submitted it. Not sure why it was so delayed getting to [place]. He just wanted to let you know that he took 5mg  Tuesday night and 2.5mg  last night. Do you want to keep the new regimen the same or change?

## 2022-07-03 NOTE — Progress Notes (Signed)
INR 1.8 Take 5mg today. Then start 5 mg on Saturday, Tuesday, and Thrusday, 2.5 mg on all other days as new schedule. Repeat INR 1 week.  

## 2022-07-04 DIAGNOSIS — E162 Hypoglycemia, unspecified: Secondary | ICD-10-CM | POA: Diagnosis not present

## 2022-07-04 DIAGNOSIS — E7800 Pure hypercholesterolemia, unspecified: Secondary | ICD-10-CM | POA: Diagnosis not present

## 2022-07-04 DIAGNOSIS — I10 Essential (primary) hypertension: Secondary | ICD-10-CM | POA: Diagnosis not present

## 2022-07-04 DIAGNOSIS — G629 Polyneuropathy, unspecified: Secondary | ICD-10-CM | POA: Diagnosis not present

## 2022-07-04 DIAGNOSIS — E1165 Type 2 diabetes mellitus with hyperglycemia: Secondary | ICD-10-CM | POA: Diagnosis not present

## 2022-07-10 ENCOUNTER — Telehealth: Payer: Self-pay | Admitting: *Deleted

## 2022-07-10 ENCOUNTER — Ambulatory Visit: Payer: Self-pay | Admitting: Family Medicine

## 2022-07-10 DIAGNOSIS — I482 Chronic atrial fibrillation, unspecified: Secondary | ICD-10-CM

## 2022-07-10 DIAGNOSIS — D6859 Other primary thrombophilia: Secondary | ICD-10-CM

## 2022-07-10 DIAGNOSIS — M1812 Unilateral primary osteoarthritis of first carpometacarpal joint, left hand: Secondary | ICD-10-CM | POA: Diagnosis not present

## 2022-07-10 DIAGNOSIS — Z86718 Personal history of other venous thrombosis and embolism: Secondary | ICD-10-CM

## 2022-07-10 DIAGNOSIS — M25642 Stiffness of left hand, not elsewhere classified: Secondary | ICD-10-CM | POA: Diagnosis not present

## 2022-07-10 LAB — POCT INR: INR: 2.6 (ref 2.0–3.0)

## 2022-07-10 NOTE — Progress Notes (Signed)
INR 2.6, no changes to current regimen. Repeat INR 1 week.

## 2022-07-10 NOTE — Telephone Encounter (Signed)
Patient aware and verbalizes understanding. 

## 2022-07-10 NOTE — Telephone Encounter (Signed)
Fax received mdINR PT/INR self testing service Test date/time 07/09/22 315 pm INR 2.6

## 2022-07-15 ENCOUNTER — Ambulatory Visit: Payer: Self-pay | Admitting: Family Medicine

## 2022-07-15 ENCOUNTER — Telehealth: Payer: Self-pay | Admitting: *Deleted

## 2022-07-15 DIAGNOSIS — Z86718 Personal history of other venous thrombosis and embolism: Secondary | ICD-10-CM

## 2022-07-15 DIAGNOSIS — D6859 Other primary thrombophilia: Secondary | ICD-10-CM

## 2022-07-15 DIAGNOSIS — Z7901 Long term (current) use of anticoagulants: Secondary | ICD-10-CM | POA: Diagnosis not present

## 2022-07-15 DIAGNOSIS — I482 Chronic atrial fibrillation, unspecified: Secondary | ICD-10-CM

## 2022-07-15 DIAGNOSIS — I4821 Permanent atrial fibrillation: Secondary | ICD-10-CM | POA: Diagnosis not present

## 2022-07-15 LAB — POCT INR: INR: 2.3 (ref 2.0–3.0)

## 2022-07-15 NOTE — Progress Notes (Signed)
INR 2.3, no changes to current regimen. Repeat INR 1 week.

## 2022-07-15 NOTE — Telephone Encounter (Signed)
Fax received mdINR PT/INR self testing service Test date/time 07/15/22 814 am INR 2.3

## 2022-07-15 NOTE — Telephone Encounter (Signed)
INR 2.3, no changes to current regimen. Repeat INR 1 week.

## 2022-07-15 NOTE — Telephone Encounter (Signed)
Patient aware and verbalizes understanding. 

## 2022-07-16 DIAGNOSIS — M25642 Stiffness of left hand, not elsewhere classified: Secondary | ICD-10-CM | POA: Diagnosis not present

## 2022-07-16 DIAGNOSIS — E785 Hyperlipidemia, unspecified: Secondary | ICD-10-CM | POA: Diagnosis not present

## 2022-07-16 DIAGNOSIS — D6869 Other thrombophilia: Secondary | ICD-10-CM | POA: Diagnosis not present

## 2022-07-16 DIAGNOSIS — I4891 Unspecified atrial fibrillation: Secondary | ICD-10-CM | POA: Diagnosis not present

## 2022-07-16 DIAGNOSIS — H3530 Unspecified macular degeneration: Secondary | ICD-10-CM | POA: Diagnosis not present

## 2022-07-16 DIAGNOSIS — B070 Plantar wart: Secondary | ICD-10-CM | POA: Diagnosis not present

## 2022-07-16 DIAGNOSIS — M1990 Unspecified osteoarthritis, unspecified site: Secondary | ICD-10-CM | POA: Diagnosis not present

## 2022-07-16 DIAGNOSIS — I8390 Asymptomatic varicose veins of unspecified lower extremity: Secondary | ICD-10-CM | POA: Diagnosis not present

## 2022-07-16 DIAGNOSIS — Z7901 Long term (current) use of anticoagulants: Secondary | ICD-10-CM | POA: Diagnosis not present

## 2022-07-16 DIAGNOSIS — E1042 Type 1 diabetes mellitus with diabetic polyneuropathy: Secondary | ICD-10-CM | POA: Diagnosis not present

## 2022-07-16 DIAGNOSIS — M1812 Unilateral primary osteoarthritis of first carpometacarpal joint, left hand: Secondary | ICD-10-CM | POA: Diagnosis not present

## 2022-07-21 DIAGNOSIS — M25642 Stiffness of left hand, not elsewhere classified: Secondary | ICD-10-CM | POA: Diagnosis not present

## 2022-07-21 DIAGNOSIS — M1812 Unilateral primary osteoarthritis of first carpometacarpal joint, left hand: Secondary | ICD-10-CM | POA: Diagnosis not present

## 2022-07-28 ENCOUNTER — Ambulatory Visit: Payer: Medicare PPO | Admitting: Gastroenterology

## 2022-07-28 ENCOUNTER — Telehealth: Payer: Self-pay

## 2022-07-28 ENCOUNTER — Encounter: Payer: Self-pay | Admitting: Gastroenterology

## 2022-07-28 ENCOUNTER — Other Ambulatory Visit: Payer: Self-pay

## 2022-07-28 VITALS — BP 138/82 | HR 75 | Ht 69.0 in | Wt 154.0 lb

## 2022-07-28 DIAGNOSIS — Z860100 Personal history of colon polyps, unspecified: Secondary | ICD-10-CM

## 2022-07-28 DIAGNOSIS — E119 Type 2 diabetes mellitus without complications: Secondary | ICD-10-CM | POA: Diagnosis not present

## 2022-07-28 DIAGNOSIS — H353132 Nonexudative age-related macular degeneration, bilateral, intermediate dry stage: Secondary | ICD-10-CM | POA: Diagnosis not present

## 2022-07-28 DIAGNOSIS — I482 Chronic atrial fibrillation, unspecified: Secondary | ICD-10-CM | POA: Diagnosis not present

## 2022-07-28 DIAGNOSIS — H2513 Age-related nuclear cataract, bilateral: Secondary | ICD-10-CM | POA: Diagnosis not present

## 2022-07-28 DIAGNOSIS — Z7901 Long term (current) use of anticoagulants: Secondary | ICD-10-CM

## 2022-07-28 LAB — HM DIABETES EYE EXAM

## 2022-07-28 MED ORDER — NA SULFATE-K SULFATE-MG SULF 17.5-3.13-1.6 GM/177ML PO SOLN: 1.0000 | Freq: Once | ORAL | 0 refills | Status: AC

## 2022-07-28 NOTE — Telephone Encounter (Signed)
[facility] Pre-operative Risk Assessment     Request for surgical clearance:     Endoscopy Procedure  What type of surgery is being performed?     Colonoscopy  When is this surgery scheduled?     08/19/22  What type of clearance is required ?   Pharmacy  Are there any medications that need to be held prior to surgery and how long? Coumadin 5 days  Practice name and name of physician performing surgery?      [facility]  What is your office phone and fax number?      Phone- [phone]  Fax- [phone]  Anesthesia type (None, local, MAC, general) ?       MAC

## 2022-07-28 NOTE — Telephone Encounter (Signed)
     Primary Cardiologist: Nanetta Batty, MD  Chart reviewed as part of pre-operative protocol coverage. Given past medical history and time since last visit, based on ACC/AHA guidelines, Christopher Avery would be at acceptable risk for the planned procedure without further cardiovascular testing.   Patient with diagnosis of afib on warfarin for anticoagulation.     Procedure: colonoscopy Date of procedure: 08/19/22   CHA2DS2-VASc Score = 3  This indicates a 3.2% annual risk of stroke. The patient's score is based upon: CHF History: 0 HTN History: 1 Diabetes History: 1 Stroke History: 0 Vascular Disease History: 0 Age Score: 1 Gender Score: 0   DVT noted 2018   CrCl 34mL/min Platelet count 174K   Per office protocol, patient can hold warfarin for 5 days prior to procedure. Patient will not need bridging with Lovenox around procedure. INRs are followed by PCP.  I will route this recommendation to the requesting party via Epic fax function and remove from pre-op pool.  Please call with questions.  Thomasene Ripple. Landis Dowdy NP-C     07/28/2022, 11:15 AM [facility] [address] Office [phone] Fax [phone]

## 2022-07-28 NOTE — Telephone Encounter (Signed)
Patient with diagnosis of afib on warfarin for anticoagulation.    Procedure: colonoscopy Date of procedure: 08/19/22  CHA2DS2-VASc Score = 3  This indicates a 3.2% annual risk of stroke. The patient's score is based upon: CHF History: 0 HTN History: 1 Diabetes History: 1 Stroke History: 0 Vascular Disease History: 0 Age Score: 1 Gender Score: 0   DVT noted 2018  CrCl 90mL/min Platelet count 174K  Per office protocol, patient can hold warfarin for 5 days prior to procedure. Patient will not need bridging with Lovenox around procedure. INRs are followed by PCP.  **This guidance is not considered finalized until pre-operative APP has relayed final recommendations.**

## 2022-07-28 NOTE — Patient Instructions (Signed)
_______________________________________________________  If your blood pressure at your visit was 140/90 or greater, please contact your primary care physician to follow up on this.  _______________________________________________________  If you are age 71 or older, your body mass index should be between 23-30. Your Body mass index is 22.74 kg/m. If this is out of the aforementioned range listed, please consider follow up with your Primary Care Provider.  If you are age 8 or younger, your body mass index should be between 19-25. Your Body mass index is 22.74 kg/m. If this is out of the aformentioned range listed, please consider follow up with your Primary Care Provider.   You have been scheduled for a colonoscopy. Please follow written instructions given to you at your visit today.  Please pick up your prep supplies at the pharmacy within the next 1-3 days. If you use inhalers (even only as needed), please bring them with you on the day of your procedure.   You will be contacted by our office prior to your procedure for directions on holding your Coumadin.  If you do not hear from our office 1 week prior to your scheduled procedure, please call [phone] to discuss.   ________________________________________________________  The [facility] providers would like to encourage you to use [facility] to communicate with providers for non-urgent requests or questions.  Due to long hold times on the telephone, sending your provider a message by [facility] may be a faster and more efficient way to get a response.  Please allow 48 business hours for a response.  Please remember that this is for non-urgent requests.   It was a pleasure to see you today!  Thank you for trusting me with your gastrointestinal care!    Scott E.Tomasa Rand, MD
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
## 2022-07-29 NOTE — Telephone Encounter (Signed)
Contacted patient and let him know to hold warfarin 5 days prior to his procedure.

## 2022-07-30 ENCOUNTER — Ambulatory Visit: Payer: Self-pay | Admitting: Family Medicine

## 2022-07-30 ENCOUNTER — Telehealth: Payer: Self-pay | Admitting: *Deleted

## 2022-07-30 DIAGNOSIS — D6859 Other primary thrombophilia: Secondary | ICD-10-CM

## 2022-07-30 DIAGNOSIS — Z86718 Personal history of other venous thrombosis and embolism: Secondary | ICD-10-CM

## 2022-07-30 DIAGNOSIS — I482 Chronic atrial fibrillation, unspecified: Secondary | ICD-10-CM

## 2022-07-30 LAB — POCT INR: INR: 3.1 — AB (ref 2.0–3.0)

## 2022-07-30 NOTE — Progress Notes (Signed)
INR 3.1, not at goal but no changes to current regimen. Repeat INR 1 week.  

## 2022-07-30 NOTE — Telephone Encounter (Signed)
Fax received mdINR PT/INR self testing service Test date/time 07/29/22 801 am INR 3.1

## 2022-07-30 NOTE — Telephone Encounter (Signed)
INR 3.1, not at goal but no changes to current regimen. Repeat INR 1 week.

## 2022-07-30 NOTE — Telephone Encounter (Signed)
Patient aware and verbalizes understanding. 

## 2022-08-05 ENCOUNTER — Ambulatory Visit (INDEPENDENT_AMBULATORY_CARE_PROVIDER_SITE_OTHER): Payer: Medicare PPO | Admitting: Family Medicine

## 2022-08-05 ENCOUNTER — Telehealth: Payer: Self-pay | Admitting: *Deleted

## 2022-08-05 DIAGNOSIS — I482 Chronic atrial fibrillation, unspecified: Secondary | ICD-10-CM | POA: Diagnosis not present

## 2022-08-05 DIAGNOSIS — Z86718 Personal history of other venous thrombosis and embolism: Secondary | ICD-10-CM

## 2022-08-05 DIAGNOSIS — D6859 Other primary thrombophilia: Secondary | ICD-10-CM

## 2022-08-05 LAB — POCT INR: INR: 1.7 — AB (ref 2.0–3.0)

## 2022-08-05 NOTE — Telephone Encounter (Signed)
INR 1.7, slightly thick, not at goal but no changes to current regimen. Repeat INR 1 week. Any dietary changes that may be causing such fluctuations in INR readings? Any changes in medication regimen or coumadin dosing? 

## 2022-08-05 NOTE — Telephone Encounter (Signed)
Fax received mdINR PT/INR self testing service Test date/time 08/05/22 811 am INR 1.7

## 2022-08-05 NOTE — Progress Notes (Signed)
INR 1.7, slightly thick, not at goal but no changes to current regimen. Repeat INR 1 week. Any dietary changes that may be causing such fluctuations in INR readings? Any changes in medication regimen or coumadin dosing?

## 2022-08-05 NOTE — Telephone Encounter (Signed)
lmtcb

## 2022-08-11 DIAGNOSIS — Z85820 Personal history of malignant melanoma of skin: Secondary | ICD-10-CM | POA: Diagnosis not present

## 2022-08-11 DIAGNOSIS — L821 Other seborrheic keratosis: Secondary | ICD-10-CM | POA: Diagnosis not present

## 2022-08-11 DIAGNOSIS — Z1283 Encounter for screening for malignant neoplasm of skin: Secondary | ICD-10-CM | POA: Diagnosis not present

## 2022-08-11 DIAGNOSIS — Z08 Encounter for follow-up examination after completed treatment for malignant neoplasm: Secondary | ICD-10-CM | POA: Diagnosis not present

## 2022-08-11 DIAGNOSIS — L570 Actinic keratosis: Secondary | ICD-10-CM | POA: Diagnosis not present

## 2022-08-11 DIAGNOSIS — X32XXXD Exposure to sunlight, subsequent encounter: Secondary | ICD-10-CM | POA: Diagnosis not present

## 2022-08-11 DIAGNOSIS — D225 Melanocytic nevi of trunk: Secondary | ICD-10-CM | POA: Diagnosis not present

## 2022-08-11 DIAGNOSIS — L820 Inflamed seborrheic keratosis: Secondary | ICD-10-CM | POA: Diagnosis not present

## 2022-08-12 ENCOUNTER — Telehealth: Payer: Self-pay | Admitting: *Deleted

## 2022-08-12 ENCOUNTER — Ambulatory Visit (INDEPENDENT_AMBULATORY_CARE_PROVIDER_SITE_OTHER): Payer: Medicare PPO | Admitting: Family Medicine

## 2022-08-12 DIAGNOSIS — I482 Chronic atrial fibrillation, unspecified: Secondary | ICD-10-CM

## 2022-08-12 DIAGNOSIS — Z86718 Personal history of other venous thrombosis and embolism: Secondary | ICD-10-CM

## 2022-08-12 DIAGNOSIS — D6859 Other primary thrombophilia: Secondary | ICD-10-CM

## 2022-08-12 LAB — POCT INR: INR: 1.6 — AB (ref 2.0–3.0)

## 2022-08-12 NOTE — Telephone Encounter (Signed)
INR 1.6, slightly thick, not at goal but no changes to current regimen. Repeat INR 1 week.  What is current dosing?

## 2022-08-12 NOTE — Telephone Encounter (Signed)
Patient states that he takes one tablet daily except he took 2 tablets Friday and Monday.    He has to stop the warfin for 5 days starting Thursday due to him having colonoscopy.

## 2022-08-12 NOTE — Telephone Encounter (Signed)
Fax received mdINR PT/INR self testing service Test date/time 08/12/22 800 am INR 1.6

## 2022-08-12 NOTE — Telephone Encounter (Signed)
Once colonoscopy is completed, we will likely need to adjust medications as INR is not at goal.

## 2022-08-12 NOTE — Progress Notes (Signed)
INR 1.6, slightly thick, not at goal but no changes to current regimen. Repeat INR 1 week.  What is current dosing? 

## 2022-08-13 NOTE — Telephone Encounter (Signed)
Called and spoke with wife this AM and she states that they rechecked the INR again this morning just to check for any change because they are concerned with it being thick and he is going to have to hold it for 5 days. The reading this morning was still 1.6. She states that he took 2 tabs Monday night and 1 tab last night. Wants to know if he should take 2 tonight since tonight is the last night he will take for a while. Please advise and route to clinical pool

## 2022-08-13 NOTE — Telephone Encounter (Signed)
Yes, take 2 tonight.

## 2022-08-13 NOTE — Telephone Encounter (Signed)
Patient's wife notified and verbalized understanding.  

## 2022-08-16 DIAGNOSIS — E1165 Type 2 diabetes mellitus with hyperglycemia: Secondary | ICD-10-CM | POA: Diagnosis not present

## 2022-08-18 NOTE — Telephone Encounter (Signed)
Old message - this encounter will be closed

## 2022-08-19 ENCOUNTER — Encounter: Payer: Self-pay | Admitting: Gastroenterology

## 2022-08-19 ENCOUNTER — Ambulatory Visit (AMBULATORY_SURGERY_CENTER): Payer: Medicare PPO | Admitting: Gastroenterology

## 2022-08-19 VITALS — BP 128/82 | HR 68 | Temp 98.6°F | Resp 12 | Ht 69.0 in | Wt 154.0 lb

## 2022-08-19 DIAGNOSIS — I10 Essential (primary) hypertension: Secondary | ICD-10-CM | POA: Diagnosis not present

## 2022-08-19 DIAGNOSIS — D120 Benign neoplasm of cecum: Secondary | ICD-10-CM | POA: Diagnosis not present

## 2022-08-19 DIAGNOSIS — I4891 Unspecified atrial fibrillation: Secondary | ICD-10-CM | POA: Diagnosis not present

## 2022-08-19 DIAGNOSIS — Z09 Encounter for follow-up examination after completed treatment for conditions other than malignant neoplasm: Secondary | ICD-10-CM | POA: Diagnosis not present

## 2022-08-19 DIAGNOSIS — E119 Type 2 diabetes mellitus without complications: Secondary | ICD-10-CM | POA: Diagnosis not present

## 2022-08-19 DIAGNOSIS — Z86010 Personal history of colonic polyps: Secondary | ICD-10-CM | POA: Diagnosis not present

## 2022-08-19 MED ORDER — SODIUM CHLORIDE 0.9 % IV SOLN
500.0000 mL | Freq: Once | INTRAVENOUS | Status: DC
Start: 2022-08-19 — End: 2022-08-19

## 2022-08-19 NOTE — Progress Notes (Signed)
 Pt's states no medical or surgical changes since previsit or office visit. 

## 2022-08-19 NOTE — Progress Notes (Signed)
History and Physical Interval Note:  08/19/2022 2:41 PM  Christopher Avery  has presented today for endoscopic procedure(s), with the diagnosis of  Encounter Diagnosis  Name Primary?   History of colonic polyps Yes  .  The various methods of evaluation and treatment have been discussed with the patient and/or family. After consideration of risks, benefits and other options for treatment, the patient has consented to  the endoscopic procedure(s).   The patient's history has been reviewed, patient examined, no change in status, stable for endoscopic procedure(s).  I have reviewed the patient's chart and labs.  Questions were answered to the patient's satisfaction.     Jhada Risk E. Tomasa Rand, MD [facility]

## 2022-08-19 NOTE — Op Note (Signed)
[facility] Patient Name: Christopher Avery Procedure Date: 08/19/2022 2:39 PM MRN: [id number] Endoscopist: Lorin Picket E. Tomasa Rand , MD, [id number] Age: 72 Referring MD:  Date of Birth: [birth year] Gender: Male Account #: [id number] Procedure:                Colonoscopy Indications:              High risk colon cancer surveillance: Personal                            history of multiple (3 or more) adenomas Medicines:                Monitored Anesthesia Care Procedure:                Pre-Anesthesia Assessment:                           - Prior to the procedure, a History and Physical                            was performed, and patient medications and                            allergies were reviewed. The patient's tolerance of                            previous anesthesia was also reviewed. The risks                            and benefits of the procedure and the sedation                            options and risks were discussed with the patient.                            All questions were answered, and informed consent                            was obtained. Prior Anticoagulants: The patient has                            taken Coumadin (warfarin), last dose was 5 days                            prior to procedure. ASA Grade Assessment: III - A                            patient with severe systemic disease. After                            reviewing the risks and benefits, the patient was                            deemed in satisfactory condition to undergo the  procedure.                           After obtaining informed consent, the colonoscope                            was passed under direct vision. Throughout the                            procedure, the patient's blood pressure, pulse, and                            oxygen saturations were monitored continuously. The                            Olympus CF-HQ190L ([id number]) Colonoscope  was                            introduced through the anus and advanced to the the                            cecum, identified by appendiceal orifice and                            ileocecal valve. The colonoscopy was performed                            without difficulty. The patient tolerated the                            procedure well. The quality of the bowel                            preparation was adequate. The ileocecal valve,                            appendiceal orifice, and rectum were photographed.                            The bowel preparation used was SUPREP via split                            dose instruction. Scope In: 2:48:50 PM Scope Out: 3:04:26 PM Scope Withdrawal Time: 0 hours 12 minutes 46 seconds  Total Procedure Duration: 0 hours 15 minutes 36 seconds  Findings:                 The perianal and digital rectal examinations were                            normal. Pertinent negatives include normal                            sphincter tone and no palpable rectal lesions.  A 3 mm polyp was found in the cecum. The polyp was                            sessile. The polyp was removed with a cold snare.                            Resection and retrieval were complete. Estimated                            blood loss was minimal.                           Multiple small-mouthed diverticula were found in                            the sigmoid colon.                           The exam was otherwise normal throughout the                            examined colon.                           Non-bleeding internal hemorrhoids were found during                            retroflexion. The hemorrhoids were large and Grade                            I (internal hemorrhoids that do not prolapse).                           No additional abnormalities were found on                            retroflexion. Complications:            No immediate  complications. Estimated Blood Loss:     Estimated blood loss was minimal. Impression:               - One 3 mm polyp in the cecum, removed with a cold                            snare. Resected and retrieved.                           - Mild diverticulosis in the sigmoid colon.                           - Non-bleeding internal hemorrhoids. Recommendation:           - Patient has a contact number available for                            emergencies. The signs and symptoms of potential  delayed complications were discussed with the                            patient. Return to normal activities tomorrow.                            Written discharge instructions were provided to the                            patient.                           - Resume previous diet.                           - Continue present medications.                           - Await pathology results.                           - Resume Coumadin (warfarin) at prior dose tomorrow.                           - Repeat colonoscopy (date not yet determined) for                            surveillance based on pathology results. [name] E. Tomasa Rand, MD 08/19/2022 3:12:26 PM This report has been signed electronically.

## 2022-08-19 NOTE — Progress Notes (Signed)
Sensor to right upper arm.

## 2022-08-19 NOTE — Patient Instructions (Signed)
Resume previous diet Continue present medications today, EXCEPT RESUME COUMADIN AT PRIOR DOSE TOMORROW, WEDNESDAY JULY 17TH! Await pathology results Handouts/information given for polyps, diverticulosis and hemorrhoids  YOU HAD AN ENDOSCOPIC PROCEDURE TODAY AT THE [facility]:   Refer to the procedure report that was given to you for any specific questions about what was found during the examination.  If the procedure report does not answer your questions, please call your gastroenterologist to clarify.  If you requested that your care partner not be given the details of your procedure findings, then the procedure report has been included in a sealed envelope for you to review at your convenience later.  YOU SHOULD EXPECT: Some feelings of bloating in the abdomen. Passage of more gas than usual.  Walking can help get rid of the air that was put into your GI tract during the procedure and reduce the bloating. If you had a lower endoscopy (such as a colonoscopy or flexible sigmoidoscopy) you may notice spotting of blood in your stool or on the toilet paper. If you underwent a bowel prep for your procedure, you may not have a normal bowel movement for a few days.  Please Note:  You might notice some irritation and congestion in your nose or some drainage.  This is from the oxygen used during your procedure.  There is no need for concern and it should clear up in a day or so.  SYMPTOMS TO REPORT IMMEDIATELY:  Following lower endoscopy (colonoscopy):  Excessive amounts of blood in the stool  Significant tenderness or worsening of abdominal pains  Swelling of the abdomen that is new, acute  Fever of 100F or higher  For urgent or emergent issues, a gastroenterologist can be reached at any hour by calling [phone]. Do not use MyChart messaging for urgent concerns.    DIET:  We do recommend a small meal at first, but then you may proceed to your regular diet.  Drink plenty of fluids  but you should avoid alcoholic beverages for 24 hours.  ACTIVITY:  You should plan to take it easy for the rest of today and you should NOT DRIVE or use heavy machinery until tomorrow (because of the sedation medicines used during the test).    FOLLOW UP: Our staff will call the number listed on your records the next business day following your procedure.  We will call around 7:15- 8:00 am to check on you and address any questions or concerns that you may have regarding the information given to you following your procedure. If we do not reach you, we will leave a message.     If any biopsies were taken you will be contacted by phone or by letter within the next 1-3 weeks.  Please call us at [phone] if you have not heard about the biopsies in 3 weeks.    SIGNATURES/CONFIDENTIALITY: You and/or your care partner have signed paperwork which will be entered into your electronic medical record.  These signatures attest to the fact that that the information above on your After Visit Summary has been reviewed and is understood.  Full responsibility of the confidentiality of this discharge information lies with you and/or your care-partner.

## 2022-08-19 NOTE — Progress Notes (Signed)
 Called to room to assist during endoscopic procedure.  [name] and intended procedure confirmed with present staff. Received instructions for my participation in the procedure from the performing physician.  

## 2022-08-19 NOTE — Progress Notes (Signed)
Sedate, gd SR, tolerated procedure well, VSS, report to RN 

## 2022-08-20 ENCOUNTER — Telehealth: Payer: Self-pay | Admitting: *Deleted

## 2022-08-20 DIAGNOSIS — Z86718 Personal history of other venous thrombosis and embolism: Secondary | ICD-10-CM

## 2022-08-20 DIAGNOSIS — Z7901 Long term (current) use of anticoagulants: Secondary | ICD-10-CM | POA: Diagnosis not present

## 2022-08-20 DIAGNOSIS — D6859 Other primary thrombophilia: Secondary | ICD-10-CM

## 2022-08-20 DIAGNOSIS — I4821 Permanent atrial fibrillation: Secondary | ICD-10-CM | POA: Diagnosis not present

## 2022-08-20 DIAGNOSIS — I482 Chronic atrial fibrillation, unspecified: Secondary | ICD-10-CM

## 2022-08-20 NOTE — Telephone Encounter (Signed)
Need to increase dosing tonight and Friday, increase by 1/2 both nights.

## 2022-08-20 NOTE — Telephone Encounter (Signed)
 Aware and verbalizes understanding.  

## 2022-08-20 NOTE — Telephone Encounter (Signed)
  Follow up Call-     08/19/2022    1:41 PM  Call back number  Post procedure Call Back phone  # [phone]  Permission to leave phone message Yes     Patient questions:  Do you have a fever, pain , or abdominal swelling? No. Pain Score  0 *  Have you tolerated food without any problems? Yes.    Have you been able to return to your normal activities? Yes.    Do you have any questions about your discharge instructions: Diet   No. Medications  No. Follow up visit  No.  Do you have questions or concerns about your Care? No.  Actions: * If pain score is 4 or above: No action needed, pain <4.

## 2022-08-20 NOTE — Telephone Encounter (Signed)
Fax received mdINR PT/INR self testing service Test date/time 08/20/22 1136 am INR 1.2

## 2022-08-20 NOTE — Telephone Encounter (Signed)
 Patient r/c  

## 2022-08-20 NOTE — Telephone Encounter (Signed)
lmtcb

## 2022-08-25 NOTE — Progress Notes (Signed)
Christopher Avery,  The polyp which I removed during your recent procedure was proven to be completely benign but is considered a "pre-cancerous" polyp that MAY have grown into cancer if it had not been removed.  Studies shows that at least 20% of women over age 71 and 30% of men over age 10 have pre-cancerous polyps.  Based on current nationally recognized surveillance guidelines, it is recommended that you have a repeat colonoscopy in 7 years.   Because colon cancer screening after age 67 is done on a case-by-case basis, taking into account the patient's risk factors for colon cancer, as well as comorbidities and life expectancy, I would recommend you make an appointment with me in 7 years to discuss the risks/benefits of further colon cancer screening.  If you develop any new rectal bleeding, abdominal pain or significant bowel habit changes, please contact me before then.

## 2022-08-27 ENCOUNTER — Telehealth: Payer: Self-pay | Admitting: *Deleted

## 2022-08-27 ENCOUNTER — Ambulatory Visit (INDEPENDENT_AMBULATORY_CARE_PROVIDER_SITE_OTHER): Payer: Medicare PPO | Admitting: Family Medicine

## 2022-08-27 DIAGNOSIS — I482 Chronic atrial fibrillation, unspecified: Secondary | ICD-10-CM

## 2022-08-27 DIAGNOSIS — D6859 Other primary thrombophilia: Secondary | ICD-10-CM

## 2022-08-27 DIAGNOSIS — Z86718 Personal history of other venous thrombosis and embolism: Secondary | ICD-10-CM

## 2022-08-27 LAB — POCT INR: INR: 1.5 — AB (ref 2.0–3.0)

## 2022-08-27 NOTE — Telephone Encounter (Signed)
Fax received mdINR PT/INR self testing service Test date/time 08/27/22 911 am INR 1.5

## 2022-08-27 NOTE — Progress Notes (Signed)
INR 1.5, slightly thick, not at goal and has only been taking 5 mg twice a week not three times per week. Will increase to 5 mg three times per week and 2.5 mg all other days.  Repeat INR 1 week.

## 2022-08-27 NOTE — Telephone Encounter (Signed)
INR 1.5, slightly thick, not at goal and has only been taking 5 mg twice a week not three times per week. Will increase to 5 mg three times per week and 2.5 mg all other days.  Repeat INR 1 week.   Pt called and made aware.

## 2022-08-28 ENCOUNTER — Telehealth: Payer: Self-pay | Admitting: Family Medicine

## 2022-08-28 ENCOUNTER — Other Ambulatory Visit: Payer: Self-pay | Admitting: Family Medicine

## 2022-08-28 DIAGNOSIS — Z792 Long term (current) use of antibiotics: Secondary | ICD-10-CM

## 2022-08-28 MED ORDER — AMOXICILLIN 500 MG PO CAPS
2000.0000 mg | ORAL_CAPSULE | Freq: Once | ORAL | 0 refills | Status: AC
Start: 2022-08-28 — End: 2022-08-28

## 2022-08-28 NOTE — Telephone Encounter (Signed)
Medication sent to pharmacy  

## 2022-08-28 NOTE — Telephone Encounter (Signed)
Pt made aware. He has no further concerns.

## 2022-08-28 NOTE — Telephone Encounter (Signed)
Patient said he broke a tooth and is going to the dentisit but usually gets an antibiotic from PCP before he goes and wants to know if she can call it in for him. Please call patient back to let him know.

## 2022-09-03 ENCOUNTER — Telehealth: Payer: Self-pay | Admitting: *Deleted

## 2022-09-03 ENCOUNTER — Ambulatory Visit: Payer: Self-pay | Admitting: Family Medicine

## 2022-09-03 DIAGNOSIS — I482 Chronic atrial fibrillation, unspecified: Secondary | ICD-10-CM

## 2022-09-03 DIAGNOSIS — Z86718 Personal history of other venous thrombosis and embolism: Secondary | ICD-10-CM

## 2022-09-03 DIAGNOSIS — D6859 Other primary thrombophilia: Secondary | ICD-10-CM

## 2022-09-03 LAB — POCT INR: INR: 2.1 (ref 2.0–3.0)

## 2022-09-03 NOTE — Telephone Encounter (Signed)
Fax received mdINR PT/INR self testing service Test date/time 09/03/22 805 am INR 2.1

## 2022-09-03 NOTE — Progress Notes (Signed)
INR 2.1, back to normal. Continue 5 mg three times per week and 2.5 mg all other days.  Repeat INR 1 week.

## 2022-09-03 NOTE — Telephone Encounter (Signed)
Aware and verbalizes understanding.  

## 2022-09-08 DIAGNOSIS — G4733 Obstructive sleep apnea (adult) (pediatric): Secondary | ICD-10-CM | POA: Diagnosis not present

## 2022-09-10 ENCOUNTER — Telehealth: Payer: Self-pay | Admitting: *Deleted

## 2022-09-10 ENCOUNTER — Ambulatory Visit (INDEPENDENT_AMBULATORY_CARE_PROVIDER_SITE_OTHER): Payer: Medicare PPO | Admitting: Family Medicine

## 2022-09-10 DIAGNOSIS — I482 Chronic atrial fibrillation, unspecified: Secondary | ICD-10-CM | POA: Diagnosis not present

## 2022-09-10 DIAGNOSIS — D6859 Other primary thrombophilia: Secondary | ICD-10-CM

## 2022-09-10 DIAGNOSIS — Z86718 Personal history of other venous thrombosis and embolism: Secondary | ICD-10-CM

## 2022-09-10 LAB — POCT INR: INR: 3.3 — AB (ref 2.0–3.0)

## 2022-09-10 NOTE — Progress Notes (Signed)
INR 3.3, slightly thin. Continue 5 mg three times per week and 2.5 mg all other days.  Repeat INR 1 week.   Monitor diet to see what foods may be interfering.

## 2022-09-10 NOTE — Telephone Encounter (Signed)
INR 3.3, slightly thin. Continue 5 mg three times per week and 2.5 mg all other days.  Repeat INR 1 week.   Monitor diet to see what foods may be interfering.

## 2022-09-10 NOTE — Telephone Encounter (Signed)
Patient aware and verbalizes understanding.  INR result sent to [facility] as requested also.

## 2022-09-10 NOTE — Telephone Encounter (Signed)
Fax received mdINR PT/INR self testing service Test date/time 09/10/22 845 am INR 3.3

## 2022-09-17 ENCOUNTER — Ambulatory Visit (INDEPENDENT_AMBULATORY_CARE_PROVIDER_SITE_OTHER): Payer: Medicare PPO | Admitting: Family Medicine

## 2022-09-17 ENCOUNTER — Telehealth: Payer: Self-pay | Admitting: *Deleted

## 2022-09-17 DIAGNOSIS — Z7901 Long term (current) use of anticoagulants: Secondary | ICD-10-CM | POA: Diagnosis not present

## 2022-09-17 DIAGNOSIS — I482 Chronic atrial fibrillation, unspecified: Secondary | ICD-10-CM

## 2022-09-17 DIAGNOSIS — D6859 Other primary thrombophilia: Secondary | ICD-10-CM

## 2022-09-17 DIAGNOSIS — Z86718 Personal history of other venous thrombosis and embolism: Secondary | ICD-10-CM

## 2022-09-17 DIAGNOSIS — I4821 Permanent atrial fibrillation: Secondary | ICD-10-CM | POA: Diagnosis not present

## 2022-09-17 LAB — POCT INR: INR: 2.6 (ref 2.0–3.0)

## 2022-09-17 NOTE — Telephone Encounter (Signed)
INR 2.6, at goal.  Continue 5 mg three times per week and 2.5 mg all other days.  Repeat INR 1 week.

## 2022-09-17 NOTE — Telephone Encounter (Signed)
Fax received mdINR PT/INR self testing service Test date/time 09/17/22 755 am INR 2.6

## 2022-09-17 NOTE — Telephone Encounter (Signed)
Pt made aware and understood. He has no further concerns.

## 2022-09-17 NOTE — Progress Notes (Signed)
INR 2.6, at goal.  Continue 5 mg three times per week and 2.5 mg all other days.  Repeat INR 1 week.

## 2022-09-24 ENCOUNTER — Telehealth: Payer: Self-pay | Admitting: Family Medicine

## 2022-09-24 ENCOUNTER — Ambulatory Visit (INDEPENDENT_AMBULATORY_CARE_PROVIDER_SITE_OTHER): Payer: Medicare PPO | Admitting: Family Medicine

## 2022-09-24 DIAGNOSIS — I482 Chronic atrial fibrillation, unspecified: Secondary | ICD-10-CM

## 2022-09-24 DIAGNOSIS — D6859 Other primary thrombophilia: Secondary | ICD-10-CM

## 2022-09-24 DIAGNOSIS — Z86718 Personal history of other venous thrombosis and embolism: Secondary | ICD-10-CM

## 2022-09-24 LAB — POCT INR: INR: 1.7 — AB (ref 2.0–3.0)

## 2022-09-24 NOTE — Telephone Encounter (Signed)
Fax received mdINR PT/INR self testing service Test date/time 09/24/22 8:16am INR 1.7

## 2022-09-24 NOTE — Telephone Encounter (Signed)
INR 1.7, not at goal.  Continue 5 mg three times per week and 2.5 mg all other days.  Repeat INR 1 week.   Any changes in medications or diet?

## 2022-09-24 NOTE — Progress Notes (Signed)
INR 1.7, not at goal.  Continue 5 mg three times per week and 2.5 mg all other days.  Repeat INR 1 week.   Any changes in medications or diet?

## 2022-09-24 NOTE — Telephone Encounter (Signed)
Wife aware and verbalizes understanding per dpr.    States that he forgot what he was supposed to take so he may of not been taking his medication correctly.  No changes in medication or diet.   Going forward would like a mychart message sent - no call needed   Will send to PCP and cathy as a FYI- note also in blue box in chart

## 2022-10-01 ENCOUNTER — Ambulatory Visit (INDEPENDENT_AMBULATORY_CARE_PROVIDER_SITE_OTHER): Payer: Medicare PPO | Admitting: Family Medicine

## 2022-10-01 ENCOUNTER — Telehealth: Payer: Self-pay | Admitting: *Deleted

## 2022-10-01 DIAGNOSIS — D6859 Other primary thrombophilia: Secondary | ICD-10-CM

## 2022-10-01 DIAGNOSIS — I482 Chronic atrial fibrillation, unspecified: Secondary | ICD-10-CM

## 2022-10-01 DIAGNOSIS — Z86718 Personal history of other venous thrombosis and embolism: Secondary | ICD-10-CM

## 2022-10-01 LAB — POCT INR: INR: 2.9 (ref 2.0–3.0)

## 2022-10-01 NOTE — Telephone Encounter (Signed)
Fax received mdINR PT/INR self testing service Test date/time 10/01/22 842 am INR 2.9

## 2022-10-01 NOTE — Telephone Encounter (Signed)
INR 2.9, at goal.  Continue 5 mg three times per week and 2.5 mg all other days.  Repeat INR 1 week.

## 2022-10-01 NOTE — Telephone Encounter (Signed)
Results sent on mychart as requested

## 2022-10-01 NOTE — Progress Notes (Signed)
INR 2.9, at goal.  Continue 5 mg three times per week and 2.5 mg all other days.  Repeat INR 1 week.

## 2022-10-09 ENCOUNTER — Telehealth: Payer: Self-pay | Admitting: *Deleted

## 2022-10-09 DIAGNOSIS — Z86718 Personal history of other venous thrombosis and embolism: Secondary | ICD-10-CM

## 2022-10-09 DIAGNOSIS — D6859 Other primary thrombophilia: Secondary | ICD-10-CM

## 2022-10-09 DIAGNOSIS — I482 Chronic atrial fibrillation, unspecified: Secondary | ICD-10-CM

## 2022-10-09 NOTE — Telephone Encounter (Signed)
Continue coumadin as is °

## 2022-10-09 NOTE — Telephone Encounter (Signed)
Fax received mdINR PT/INR self testing service Test date/time 10/09/22 816 am INR 2.9

## 2022-10-10 NOTE — Telephone Encounter (Signed)
Sent results on mychart as patient previously requested

## 2022-10-15 ENCOUNTER — Ambulatory Visit (INDEPENDENT_AMBULATORY_CARE_PROVIDER_SITE_OTHER): Payer: Medicare PPO | Admitting: Family Medicine

## 2022-10-15 ENCOUNTER — Telehealth: Payer: Self-pay | Admitting: Family Medicine

## 2022-10-15 DIAGNOSIS — Z7901 Long term (current) use of anticoagulants: Secondary | ICD-10-CM | POA: Diagnosis not present

## 2022-10-15 DIAGNOSIS — I482 Chronic atrial fibrillation, unspecified: Secondary | ICD-10-CM

## 2022-10-15 DIAGNOSIS — Z86718 Personal history of other venous thrombosis and embolism: Secondary | ICD-10-CM

## 2022-10-15 DIAGNOSIS — D6859 Other primary thrombophilia: Secondary | ICD-10-CM

## 2022-10-15 DIAGNOSIS — I4821 Permanent atrial fibrillation: Secondary | ICD-10-CM | POA: Diagnosis not present

## 2022-10-15 LAB — POCT INR: INR: 3.2 — AB (ref 2.0–3.0)

## 2022-10-15 NOTE — Progress Notes (Signed)
INR 3.2, slightly above goal. Will not change regimen. Continue 5 mg three times per week and 2.5 mg all other days.  Repeat INR 1 week.

## 2022-10-15 NOTE — Telephone Encounter (Signed)
INR 3.2, slightly above goal. Continue 5 mg three times per week and 2.5 mg all other days.  Repeat INR 1 week.

## 2022-10-15 NOTE — Telephone Encounter (Signed)
Fax received mdINR PT/INR self testing service Test date/time 10/15/2022 4:07pm INR 3.2

## 2022-10-16 ENCOUNTER — Telehealth: Payer: Self-pay | Admitting: *Deleted

## 2022-10-16 NOTE — Telephone Encounter (Signed)
Sent to my chart as requested

## 2022-10-16 NOTE — Telephone Encounter (Signed)
Erroneous encounter, duplicate.

## 2022-10-22 ENCOUNTER — Telehealth: Payer: Self-pay | Admitting: *Deleted

## 2022-10-22 ENCOUNTER — Ambulatory Visit (INDEPENDENT_AMBULATORY_CARE_PROVIDER_SITE_OTHER): Payer: Medicare PPO | Admitting: Family Medicine

## 2022-10-22 DIAGNOSIS — D6859 Other primary thrombophilia: Secondary | ICD-10-CM

## 2022-10-22 DIAGNOSIS — Z86718 Personal history of other venous thrombosis and embolism: Secondary | ICD-10-CM

## 2022-10-22 DIAGNOSIS — I482 Chronic atrial fibrillation, unspecified: Secondary | ICD-10-CM | POA: Diagnosis not present

## 2022-10-22 LAB — POCT INR: INR: 1.7 — AB (ref 2.0–3.0)

## 2022-10-22 NOTE — Telephone Encounter (Signed)
INR 1.7, slightly below goal. Has anything changed in regimen as you were thin last week?

## 2022-10-22 NOTE — Telephone Encounter (Signed)
lmtcb

## 2022-10-22 NOTE — Progress Notes (Signed)
INR 1.7, slightly below goal. Has anything changed in regimen as you were thin last week?

## 2022-10-22 NOTE — Telephone Encounter (Signed)
Fax received mdINR PT/INR self testing service Test date/time 10/22/22 757 am INR 1.7

## 2022-10-29 ENCOUNTER — Ambulatory Visit (INDEPENDENT_AMBULATORY_CARE_PROVIDER_SITE_OTHER): Payer: Medicare PPO | Admitting: Family Medicine

## 2022-10-29 ENCOUNTER — Telehealth: Payer: Self-pay | Admitting: *Deleted

## 2022-10-29 DIAGNOSIS — I482 Chronic atrial fibrillation, unspecified: Secondary | ICD-10-CM

## 2022-10-29 DIAGNOSIS — Z86718 Personal history of other venous thrombosis and embolism: Secondary | ICD-10-CM

## 2022-10-29 DIAGNOSIS — D6859 Other primary thrombophilia: Secondary | ICD-10-CM

## 2022-10-29 LAB — POCT INR: INR: 2.2 (ref 2.0–3.0)

## 2022-10-29 NOTE — Telephone Encounter (Signed)
Called patient, no answer, left detailed voice mail

## 2022-10-29 NOTE — Telephone Encounter (Signed)
Fax received mdINR PT/INR self testing service Test date/time 10/29/22 4:00 pm INR 2.2

## 2022-10-29 NOTE — Telephone Encounter (Signed)
INR 2.2, continue current regimen.

## 2022-10-29 NOTE — Progress Notes (Signed)
INR 2.2, continue current regimen.

## 2022-11-04 NOTE — Telephone Encounter (Signed)
Refer to previus lab results

## 2022-11-05 ENCOUNTER — Telehealth: Payer: Self-pay | Admitting: *Deleted

## 2022-11-05 ENCOUNTER — Ambulatory Visit (INDEPENDENT_AMBULATORY_CARE_PROVIDER_SITE_OTHER): Payer: Medicare PPO | Admitting: Family Medicine

## 2022-11-05 DIAGNOSIS — D6859 Other primary thrombophilia: Secondary | ICD-10-CM

## 2022-11-05 DIAGNOSIS — I482 Chronic atrial fibrillation, unspecified: Secondary | ICD-10-CM | POA: Diagnosis not present

## 2022-11-05 DIAGNOSIS — Z86718 Personal history of other venous thrombosis and embolism: Secondary | ICD-10-CM

## 2022-11-05 LAB — POCT INR: INR: 2.8 (ref 2.0–3.0)

## 2022-11-05 NOTE — Progress Notes (Signed)
INR 2.8, at goal, continue current regimen.

## 2022-11-05 NOTE — Telephone Encounter (Signed)
INR 2.8, at goal, continue current regimen.

## 2022-11-05 NOTE — Telephone Encounter (Signed)
Sent to [employer] & also left detailed message on VM

## 2022-11-05 NOTE — Telephone Encounter (Signed)
Fax received mdINR PT/INR self testing service Test date/time 11/05/22 745 am INR 2.8

## 2022-11-12 ENCOUNTER — Telehealth: Payer: Self-pay | Admitting: *Deleted

## 2022-11-12 ENCOUNTER — Ambulatory Visit (INDEPENDENT_AMBULATORY_CARE_PROVIDER_SITE_OTHER): Payer: Medicare PPO | Admitting: Family Medicine

## 2022-11-12 DIAGNOSIS — Z7901 Long term (current) use of anticoagulants: Secondary | ICD-10-CM | POA: Diagnosis not present

## 2022-11-12 DIAGNOSIS — I482 Chronic atrial fibrillation, unspecified: Secondary | ICD-10-CM

## 2022-11-12 DIAGNOSIS — D6859 Other primary thrombophilia: Secondary | ICD-10-CM

## 2022-11-12 DIAGNOSIS — I4821 Permanent atrial fibrillation: Secondary | ICD-10-CM | POA: Diagnosis not present

## 2022-11-12 DIAGNOSIS — Z86718 Personal history of other venous thrombosis and embolism: Secondary | ICD-10-CM

## 2022-11-12 LAB — POCT INR: INR: 2 (ref 2.0–3.0)

## 2022-11-12 NOTE — Telephone Encounter (Signed)
INR 2.0, at goal, continue current regimen.

## 2022-11-12 NOTE — Progress Notes (Signed)
INR 2.0, at goal, continue current regimen.

## 2022-11-12 NOTE — Telephone Encounter (Signed)
Called patient and left voicemail on home number and also voicemail on cell number with directions to continue.

## 2022-11-12 NOTE — Telephone Encounter (Signed)
Fax received mdINR PT/INR self testing service Test date/time 11/12/22 638 am INR 2.0

## 2022-11-14 DIAGNOSIS — E1165 Type 2 diabetes mellitus with hyperglycemia: Secondary | ICD-10-CM | POA: Diagnosis not present

## 2022-11-19 ENCOUNTER — Telehealth: Payer: Self-pay | Admitting: *Deleted

## 2022-11-19 ENCOUNTER — Ambulatory Visit (INDEPENDENT_AMBULATORY_CARE_PROVIDER_SITE_OTHER): Payer: Medicare PPO | Admitting: Family Medicine

## 2022-11-19 DIAGNOSIS — I482 Chronic atrial fibrillation, unspecified: Secondary | ICD-10-CM | POA: Diagnosis not present

## 2022-11-19 DIAGNOSIS — Z86718 Personal history of other venous thrombosis and embolism: Secondary | ICD-10-CM

## 2022-11-19 DIAGNOSIS — D6859 Other primary thrombophilia: Secondary | ICD-10-CM

## 2022-11-19 LAB — POCT INR: INR: 2.3 (ref 2.0–3.0)

## 2022-11-19 NOTE — Telephone Encounter (Signed)
INR 2.3 at goal, continue current regimen.

## 2022-11-19 NOTE — Telephone Encounter (Signed)
Fax received mdINR PT/INR self testing service Test date/time 11/19/22 812 am INR 2.3

## 2022-11-19 NOTE — Telephone Encounter (Signed)
Patient aware and verbalizes understanding. 

## 2022-11-19 NOTE — Progress Notes (Signed)
INR 2.3 at goal, continue current regimen.

## 2022-11-25 DIAGNOSIS — E162 Hypoglycemia, unspecified: Secondary | ICD-10-CM | POA: Diagnosis not present

## 2022-11-25 DIAGNOSIS — E1165 Type 2 diabetes mellitus with hyperglycemia: Secondary | ICD-10-CM | POA: Diagnosis not present

## 2022-11-25 DIAGNOSIS — E7800 Pure hypercholesterolemia, unspecified: Secondary | ICD-10-CM | POA: Diagnosis not present

## 2022-11-25 DIAGNOSIS — I10 Essential (primary) hypertension: Secondary | ICD-10-CM | POA: Diagnosis not present

## 2022-11-25 DIAGNOSIS — G629 Polyneuropathy, unspecified: Secondary | ICD-10-CM | POA: Diagnosis not present

## 2022-11-25 DIAGNOSIS — E1065 Type 1 diabetes mellitus with hyperglycemia: Secondary | ICD-10-CM | POA: Diagnosis not present

## 2022-11-26 ENCOUNTER — Telehealth: Payer: Self-pay | Admitting: *Deleted

## 2022-11-26 ENCOUNTER — Ambulatory Visit: Payer: Self-pay | Admitting: Family Medicine

## 2022-11-26 DIAGNOSIS — I482 Chronic atrial fibrillation, unspecified: Secondary | ICD-10-CM

## 2022-11-26 DIAGNOSIS — D6859 Other primary thrombophilia: Secondary | ICD-10-CM

## 2022-11-26 DIAGNOSIS — Z86718 Personal history of other venous thrombosis and embolism: Secondary | ICD-10-CM

## 2022-11-26 LAB — POCT INR: INR: 2.7 (ref 2.0–3.0)

## 2022-11-26 NOTE — Telephone Encounter (Signed)
Fax received mdINR PT/INR self testing service Test date/time 11/26/22 803 am INR 2.7

## 2022-11-26 NOTE — Telephone Encounter (Signed)
Lmtcb.

## 2022-11-26 NOTE — Progress Notes (Signed)
INR 2.7 at goal, continue current regimen.

## 2022-11-26 NOTE — Telephone Encounter (Signed)
INR 2.7 at goal, continue current regimen.

## 2022-11-29 ENCOUNTER — Other Ambulatory Visit: Payer: Self-pay | Admitting: Family Medicine

## 2022-12-01 ENCOUNTER — Telehealth: Payer: Self-pay | Admitting: Family Medicine

## 2022-12-01 NOTE — Telephone Encounter (Signed)
Lmtcb to schedule an apt

## 2022-12-01 NOTE — Telephone Encounter (Signed)
Pt called requesting that his PCP send in refills for the generic Viagra that she has prescribed to him in the past. Please advise.

## 2022-12-03 ENCOUNTER — Telehealth: Payer: Self-pay | Admitting: *Deleted

## 2022-12-03 ENCOUNTER — Ambulatory Visit (INDEPENDENT_AMBULATORY_CARE_PROVIDER_SITE_OTHER): Payer: Medicare PPO | Admitting: Family Medicine

## 2022-12-03 DIAGNOSIS — I482 Chronic atrial fibrillation, unspecified: Secondary | ICD-10-CM | POA: Diagnosis not present

## 2022-12-03 DIAGNOSIS — D6859 Other primary thrombophilia: Secondary | ICD-10-CM

## 2022-12-03 DIAGNOSIS — Z86718 Personal history of other venous thrombosis and embolism: Secondary | ICD-10-CM

## 2022-12-03 LAB — POCT INR: INR: 2.9 (ref 2.0–3.0)

## 2022-12-03 NOTE — Telephone Encounter (Signed)
lmtcb

## 2022-12-03 NOTE — Progress Notes (Signed)
INR 2.9 at goal, continue current regimen.

## 2022-12-03 NOTE — Telephone Encounter (Signed)
INR 2.9 at goal, continue current regimen.

## 2022-12-03 NOTE — Telephone Encounter (Signed)
Fax received mdINR PT/INR self testing service Test date/time 12/03/22 700 am INR 2.9

## 2022-12-05 ENCOUNTER — Ambulatory Visit: Payer: Medicare PPO | Admitting: Family Medicine

## 2022-12-05 ENCOUNTER — Other Ambulatory Visit: Payer: Self-pay | Admitting: Family Medicine

## 2022-12-05 ENCOUNTER — Encounter: Payer: Self-pay | Admitting: Family Medicine

## 2022-12-05 VITALS — BP 140/83 | HR 72 | Temp 98.0°F | Ht 69.0 in | Wt 159.8 lb

## 2022-12-05 DIAGNOSIS — J309 Allergic rhinitis, unspecified: Secondary | ICD-10-CM | POA: Insufficient documentation

## 2022-12-05 DIAGNOSIS — I152 Hypertension secondary to endocrine disorders: Secondary | ICD-10-CM | POA: Diagnosis not present

## 2022-12-05 DIAGNOSIS — E785 Hyperlipidemia, unspecified: Secondary | ICD-10-CM

## 2022-12-05 DIAGNOSIS — E559 Vitamin D deficiency, unspecified: Secondary | ICD-10-CM | POA: Diagnosis not present

## 2022-12-05 DIAGNOSIS — Z794 Long term (current) use of insulin: Secondary | ICD-10-CM

## 2022-12-05 DIAGNOSIS — E1141 Type 2 diabetes mellitus with diabetic mononeuropathy: Secondary | ICD-10-CM

## 2022-12-05 DIAGNOSIS — E119 Type 2 diabetes mellitus without complications: Secondary | ICD-10-CM | POA: Diagnosis not present

## 2022-12-05 DIAGNOSIS — R0982 Postnasal drip: Secondary | ICD-10-CM

## 2022-12-05 DIAGNOSIS — E1159 Type 2 diabetes mellitus with other circulatory complications: Secondary | ICD-10-CM

## 2022-12-05 DIAGNOSIS — E1169 Type 2 diabetes mellitus with other specified complication: Secondary | ICD-10-CM

## 2022-12-05 DIAGNOSIS — N521 Erectile dysfunction due to diseases classified elsewhere: Secondary | ICD-10-CM

## 2022-12-05 DIAGNOSIS — I482 Chronic atrial fibrillation, unspecified: Secondary | ICD-10-CM | POA: Diagnosis not present

## 2022-12-05 MED ORDER — FLUTICASONE PROPIONATE 50 MCG/ACT NA SUSP: 2.0000 | Freq: Every day | NASAL | 6 refills | Status: AC

## 2022-12-05 MED ORDER — SILDENAFIL CITRATE 100 MG PO TABS
50.0000 mg | ORAL_TABLET | Freq: Every day | ORAL | 11 refills | Status: DC | PRN
Start: 2022-12-05 — End: 2023-12-11

## 2022-12-05 MED ORDER — CETIRIZINE HCL 10 MG PO TABS
10.0000 mg | ORAL_TABLET | Freq: Every day | ORAL | 1 refills | Status: DC
Start: 2022-12-05 — End: 2022-12-05

## 2022-12-05 MED ORDER — CETIRIZINE HCL 10 MG PO TABS: 10.0000 mg | ORAL_TABLET | Freq: Every day | ORAL | 1 refills | Status: AC
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
## 2022-12-06 ENCOUNTER — Other Ambulatory Visit: Payer: Self-pay | Admitting: Family Medicine

## 2022-12-06 LAB — ANEMIA PROFILE B
Basophils Absolute: 0 10*3/uL (ref 0.0–0.2)
Basos: 0 %
EOS (ABSOLUTE): 0.1 10*3/uL (ref 0.0–0.4)
Eos: 2 %
Ferritin: 123 ng/mL (ref 30–400)
Folate: 11.5 ng/mL (ref >3.0)
Hematocrit: 42.2 % (ref 37.5–51.0)
Hemoglobin: 13.8 g/dL (ref 13.0–17.7)
Immature Grans (Abs): 0 10*3/uL (ref 0.0–0.1)
Immature Granulocytes: 0 %
Iron Saturation: 23 % (ref 15–55)
Iron: 52 ug/dL (ref 38–169)
Lymphocytes Absolute: 1 10*3/uL (ref 0.7–3.1)
Lymphs: 20 %
MCH: 31.5 pg (ref 26.6–33.0)
MCHC: 32.7 g/dL (ref 31.5–35.7)
MCV: 96 fL (ref 79–97)
Monocytes Absolute: 0.5 10*3/uL (ref 0.1–0.9)
Monocytes: 9 %
Neutrophils Absolute: 3.3 10*3/uL (ref 1.4–7.0)
Neutrophils: 69 %
Platelets: 157 10*3/uL (ref 150–450)
RBC: 4.38 x10E6/uL (ref 4.14–5.80)
RDW: 12.2 % (ref 11.6–15.4)
Retic Ct Pct: 0.7 % (ref 0.6–2.6)
Total Iron Binding Capacity: 231 ug/dL — ABNORMAL LOW (ref 250–450)
UIBC: 179 ug/dL (ref 111–343)
Vitamin B-12: 444 pg/mL (ref 232–1245)
WBC: 4.8 10*3/uL (ref 3.4–10.8)

## 2022-12-06 LAB — CMP14+EGFR
ALT: 25 [IU]/L (ref 0–44)
AST: 30 [IU]/L (ref 0–40)
Albumin: 4.4 g/dL (ref 3.8–4.8)
Alkaline Phosphatase: 70 [IU]/L (ref 44–121)
BUN/Creatinine Ratio: 23 (ref 10–24)
BUN: 23 mg/dL (ref 8–27)
Bilirubin Total: 0.5 mg/dL (ref 0.0–1.2)
CO2: 25 mmol/L (ref 20–29)
Calcium: 9.6 mg/dL (ref 8.6–10.2)
Chloride: 103 mmol/L (ref 96–106)
Creatinine, Ser: 1.01 mg/dL (ref 0.76–1.27)
Globulin, Total: 1.9 g/dL (ref 1.5–4.5)
Glucose: 128 mg/dL — ABNORMAL HIGH (ref 70–99)
Potassium: 4.6 mmol/L (ref 3.5–5.2)
Sodium: 141 mmol/L (ref 134–144)
Total Protein: 6.3 g/dL (ref 6.0–8.5)
eGFR: 80 mL/min/{1.73_m2} (ref >59)

## 2022-12-06 LAB — LIPID PANEL
Chol/HDL Ratio: 2.2 ratio (ref 0.0–5.0)
Cholesterol, Total: 132 mg/dL (ref 100–199)
HDL: 60 mg/dL (ref >39)
LDL Chol Calc (NIH): 60 mg/dL (ref 0–99)
Triglycerides: 53 mg/dL (ref 0–149)
VLDL Cholesterol Cal: 12 mg/dL (ref 5–40)

## 2022-12-06 LAB — THYROID PANEL WITH TSH
Free Thyroxine Index: 1.9 (ref 1.2–4.9)
T3 Uptake Ratio: 28 % (ref 24–39)
T4, Total: 6.9 ug/dL (ref 4.5–12.0)
TSH: 1.53 u[IU]/mL (ref 0.450–4.500)

## 2022-12-06 LAB — VITAMIN D 25 HYDROXY (VIT D DEFICIENCY, FRACTURES): Vit D, 25-Hydroxy: 75.2 ng/mL (ref 30.0–100.0)

## 2022-12-08 NOTE — Telephone Encounter (Signed)
Refer to phone note

## 2022-12-08 NOTE — Telephone Encounter (Signed)
lmtcb

## 2022-12-10 ENCOUNTER — Telehealth: Payer: Self-pay | Admitting: *Deleted

## 2022-12-10 ENCOUNTER — Ambulatory Visit (INDEPENDENT_AMBULATORY_CARE_PROVIDER_SITE_OTHER): Payer: Medicare PPO | Admitting: Family Medicine

## 2022-12-10 DIAGNOSIS — Z7901 Long term (current) use of anticoagulants: Secondary | ICD-10-CM | POA: Diagnosis not present

## 2022-12-10 DIAGNOSIS — I4821 Permanent atrial fibrillation: Secondary | ICD-10-CM | POA: Diagnosis not present

## 2022-12-10 DIAGNOSIS — D6859 Other primary thrombophilia: Secondary | ICD-10-CM

## 2022-12-10 DIAGNOSIS — I482 Chronic atrial fibrillation, unspecified: Secondary | ICD-10-CM

## 2022-12-10 DIAGNOSIS — Z86718 Personal history of other venous thrombosis and embolism: Secondary | ICD-10-CM

## 2022-12-10 LAB — POCT INR: INR: 2.6 (ref 2.0–3.0)

## 2022-12-10 NOTE — Progress Notes (Signed)
INR 2.6 at goal, continue current regimen.

## 2022-12-10 NOTE — Telephone Encounter (Signed)
lmtcb

## 2022-12-10 NOTE — Telephone Encounter (Signed)
INR 2.6 at goal, continue current regimen.

## 2022-12-10 NOTE — Telephone Encounter (Signed)
Fax received [facility] PT/INR self testing service Test date/time 12/10/22 806 am INR 2.6

## 2022-12-15 NOTE — Telephone Encounter (Signed)
lmtcb

## 2022-12-18 ENCOUNTER — Telehealth: Payer: Self-pay | Admitting: *Deleted

## 2022-12-18 ENCOUNTER — Ambulatory Visit (INDEPENDENT_AMBULATORY_CARE_PROVIDER_SITE_OTHER): Payer: Medicare PPO | Admitting: Family Medicine

## 2022-12-18 DIAGNOSIS — D6859 Other primary thrombophilia: Secondary | ICD-10-CM

## 2022-12-18 DIAGNOSIS — I482 Chronic atrial fibrillation, unspecified: Secondary | ICD-10-CM | POA: Diagnosis not present

## 2022-12-18 DIAGNOSIS — Z86718 Personal history of other venous thrombosis and embolism: Secondary | ICD-10-CM

## 2022-12-18 LAB — POCT INR: INR: 4.4 — AB (ref 2.0–3.0)

## 2022-12-18 NOTE — Telephone Encounter (Signed)
INR 4.4, not at goal, hold dose tonight and then resume normal regimen. Repeat INR in 3-7 days.

## 2022-12-18 NOTE — Telephone Encounter (Signed)
Fax received [facility] PT/INR self testing service Test date/time 12/17/22 529 pm INR 4.4

## 2022-12-18 NOTE — Telephone Encounter (Signed)
Patient aware of results and instruction

## 2022-12-18 NOTE — Progress Notes (Signed)
INR 4.4, not at goal, hold dose tonight and then resume normal regimen. Repeat INR in 3-7 days.

## 2022-12-23 ENCOUNTER — Ambulatory Visit (INDEPENDENT_AMBULATORY_CARE_PROVIDER_SITE_OTHER): Payer: Medicare PPO | Admitting: Family Medicine

## 2022-12-23 ENCOUNTER — Encounter: Payer: Self-pay | Admitting: Family Medicine

## 2022-12-23 ENCOUNTER — Ambulatory Visit (INDEPENDENT_AMBULATORY_CARE_PROVIDER_SITE_OTHER): Payer: Medicare PPO

## 2022-12-23 VITALS — BP 119/59 | HR 69 | Temp 98.7°F | Ht 69.0 in | Wt 164.0 lb

## 2022-12-23 DIAGNOSIS — M545 Low back pain, unspecified: Secondary | ICD-10-CM | POA: Diagnosis not present

## 2022-12-23 DIAGNOSIS — M47816 Spondylosis without myelopathy or radiculopathy, lumbar region: Secondary | ICD-10-CM | POA: Diagnosis not present

## 2022-12-23 DIAGNOSIS — M898X9 Other specified disorders of bone, unspecified site: Secondary | ICD-10-CM
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
## 2022-12-24 ENCOUNTER — Other Ambulatory Visit (INDEPENDENT_AMBULATORY_CARE_PROVIDER_SITE_OTHER): Payer: Medicare PPO | Admitting: Family Medicine

## 2022-12-24 ENCOUNTER — Telehealth: Payer: Self-pay | Admitting: *Deleted

## 2022-12-24 DIAGNOSIS — D6859 Other primary thrombophilia: Secondary | ICD-10-CM

## 2022-12-24 DIAGNOSIS — I482 Chronic atrial fibrillation, unspecified: Secondary | ICD-10-CM | POA: Diagnosis not present

## 2022-12-24 DIAGNOSIS — Z86718 Personal history of other venous thrombosis and embolism: Secondary | ICD-10-CM

## 2022-12-24 LAB — POCT INR: INR: 2.7 (ref 2.0–3.0)

## 2022-12-24 NOTE — Telephone Encounter (Signed)
INR 2.7, resume normal regimen. Repeat INR in 1 week

## 2022-12-24 NOTE — Progress Notes (Signed)
INR 2.7, resume normal regimen. Repeat INR in 1 week

## 2022-12-24 NOTE — Telephone Encounter (Signed)
Fax received mdINR PT/INR self testing service Test date/time 12/24/22 730 am INR 2.7

## 2022-12-24 NOTE — Telephone Encounter (Signed)
Patient aware.

## 2022-12-26 DIAGNOSIS — E1065 Type 1 diabetes mellitus with hyperglycemia: Secondary | ICD-10-CM | POA: Diagnosis not present

## 2022-12-31 ENCOUNTER — Telehealth: Payer: Self-pay | Admitting: Family Medicine

## 2022-12-31 ENCOUNTER — Ambulatory Visit (INDEPENDENT_AMBULATORY_CARE_PROVIDER_SITE_OTHER): Payer: Medicare PPO | Admitting: Family Medicine

## 2022-12-31 DIAGNOSIS — Z86718 Personal history of other venous thrombosis and embolism: Secondary | ICD-10-CM

## 2022-12-31 DIAGNOSIS — D6859 Other primary thrombophilia: Secondary | ICD-10-CM

## 2022-12-31 DIAGNOSIS — I482 Chronic atrial fibrillation, unspecified: Secondary | ICD-10-CM | POA: Diagnosis not present

## 2022-12-31 LAB — POCT INR: INR: 2.7 (ref 2.0–3.0)

## 2022-12-31 NOTE — Progress Notes (Signed)
INR 2.7, at goal, continue normal regimen. Repeat INR in 1 week

## 2022-12-31 NOTE — Telephone Encounter (Signed)
Fax received mdINR PT/INR self testing service Test date/time 12/31/22 8:31 INR 2.7

## 2022-12-31 NOTE — Telephone Encounter (Signed)
Patient aware.

## 2023-01-07 ENCOUNTER — Telehealth: Payer: Self-pay | Admitting: *Deleted

## 2023-01-07 DIAGNOSIS — I4821 Permanent atrial fibrillation: Secondary | ICD-10-CM | POA: Diagnosis not present

## 2023-01-07 DIAGNOSIS — D6859 Other primary thrombophilia: Secondary | ICD-10-CM

## 2023-01-07 DIAGNOSIS — I482 Chronic atrial fibrillation, unspecified: Secondary | ICD-10-CM

## 2023-01-07 DIAGNOSIS — Z86718 Personal history of other venous thrombosis and embolism: Secondary | ICD-10-CM

## 2023-01-07 DIAGNOSIS — Z7901 Long term (current) use of anticoagulants: Secondary | ICD-10-CM | POA: Diagnosis not present

## 2023-01-07 NOTE — Telephone Encounter (Signed)
NA

## 2023-01-07 NOTE — Telephone Encounter (Signed)
Description   INR 2.4, at goal, continue normal regimen. Repeat INR in 1 week        Arville Care, MD [facility] 01/07/2023, 11:53 AM

## 2023-01-07 NOTE — Telephone Encounter (Signed)
Fax received mdINR PT/INR self testing service Test date/time 01/07/23 813 am INR 2.4

## 2023-01-08 ENCOUNTER — Telehealth: Payer: Self-pay

## 2023-01-08 NOTE — Telephone Encounter (Signed)
Informed pt xray results are not back yet and that they are behind on reading the xrays but we will call once we get report. Pt verbalized understanding

## 2023-01-08 NOTE — Telephone Encounter (Signed)
Copied from CRM [id number]. Topic: Clinical - Lab/Test Results >> Jan 08, 2023  1:26 PM Joanette Gula wrote: Reason for CRM: wants the results of his chest X-Ray

## 2023-01-09 NOTE — Telephone Encounter (Signed)
Patient aware.

## 2023-01-13 NOTE — Progress Notes (Signed)
If patient is still symptomatic, can order additional imaging. Otherwise no acute fracture or abnormality on imaging. Has some degenerative changes to lumbar spine.

## 2023-01-14 ENCOUNTER — Telehealth: Payer: Self-pay | Admitting: *Deleted

## 2023-01-14 ENCOUNTER — Ambulatory Visit (INDEPENDENT_AMBULATORY_CARE_PROVIDER_SITE_OTHER): Payer: Medicare PPO | Admitting: Family Medicine

## 2023-01-14 DIAGNOSIS — I482 Chronic atrial fibrillation, unspecified: Secondary | ICD-10-CM

## 2023-01-14 DIAGNOSIS — D6859 Other primary thrombophilia: Secondary | ICD-10-CM

## 2023-01-14 DIAGNOSIS — Z86718 Personal history of other venous thrombosis and embolism: Secondary | ICD-10-CM

## 2023-01-14 LAB — POCT INR: INR: 2.5 (ref 2.0–3.0)

## 2023-01-14 NOTE — Progress Notes (Signed)
INR 2.5, at goal, continue normal regimen. Repeat INR in 1 week

## 2023-01-14 NOTE — Telephone Encounter (Signed)
Fax received mdINR PT/INR self testing service Test date/time 01/14/23 707 am INR 2.5

## 2023-01-21 ENCOUNTER — Telehealth: Payer: Self-pay | Admitting: *Deleted

## 2023-01-21 ENCOUNTER — Ambulatory Visit (INDEPENDENT_AMBULATORY_CARE_PROVIDER_SITE_OTHER): Payer: Medicare PPO | Admitting: Family Medicine

## 2023-01-21 DIAGNOSIS — D6859 Other primary thrombophilia: Secondary | ICD-10-CM

## 2023-01-21 DIAGNOSIS — I482 Chronic atrial fibrillation, unspecified: Secondary | ICD-10-CM | POA: Diagnosis not present

## 2023-01-21 DIAGNOSIS — Z86718 Personal history of other venous thrombosis and embolism: Secondary | ICD-10-CM

## 2023-01-21 LAB — POCT INR: INR: 2 (ref 2.0–3.0)

## 2023-01-21 NOTE — Telephone Encounter (Signed)
Patient aware and verbalizes understanding. 

## 2023-01-21 NOTE — Progress Notes (Signed)
INR 2.0, at goal, continue normal regimen. Repeat INR in 1 week

## 2023-01-21 NOTE — Telephone Encounter (Signed)
Fax received mdINR PT/INR self testing service Test date/time 01/21/23 841 am INR 2.0

## 2023-01-25 DIAGNOSIS — E1065 Type 1 diabetes mellitus with hyperglycemia: Secondary | ICD-10-CM | POA: Diagnosis not present

## 2023-01-29 ENCOUNTER — Ambulatory Visit (INDEPENDENT_AMBULATORY_CARE_PROVIDER_SITE_OTHER): Payer: Medicare PPO | Admitting: Family Medicine

## 2023-01-29 ENCOUNTER — Telehealth: Payer: Self-pay | Admitting: *Deleted

## 2023-01-29 DIAGNOSIS — I482 Chronic atrial fibrillation, unspecified: Secondary | ICD-10-CM

## 2023-01-29 DIAGNOSIS — D6859 Other primary thrombophilia: Secondary | ICD-10-CM

## 2023-01-29 DIAGNOSIS — Z86718 Personal history of other venous thrombosis and embolism: Secondary | ICD-10-CM

## 2023-01-29 LAB — POCT INR: INR: 1.9 — AB (ref 2.0–3.0)

## 2023-01-29 NOTE — Telephone Encounter (Signed)
Patient aware and verbalized understanding. °

## 2023-01-29 NOTE — Telephone Encounter (Signed)
Fax received mdINR PT/INR self testing service Test date/time 01/27/23 1107 am INR 1.9

## 2023-01-29 NOTE — Progress Notes (Signed)
INR 1.9, not at goal, slightly thick, continue normal regimen. Repeat INR in 1 week

## 2023-02-03 DIAGNOSIS — E1065 Type 1 diabetes mellitus with hyperglycemia: Secondary | ICD-10-CM | POA: Diagnosis not present

## 2023-02-05 ENCOUNTER — Ambulatory Visit (INDEPENDENT_AMBULATORY_CARE_PROVIDER_SITE_OTHER): Payer: Medicare PPO | Admitting: Family Medicine

## 2023-02-05 ENCOUNTER — Telehealth: Payer: Self-pay

## 2023-02-05 DIAGNOSIS — Z86718 Personal history of other venous thrombosis and embolism: Secondary | ICD-10-CM

## 2023-02-05 DIAGNOSIS — I4821 Permanent atrial fibrillation: Secondary | ICD-10-CM | POA: Diagnosis not present

## 2023-02-05 DIAGNOSIS — Z7901 Long term (current) use of anticoagulants: Secondary | ICD-10-CM | POA: Diagnosis not present

## 2023-02-05 DIAGNOSIS — I482 Chronic atrial fibrillation, unspecified: Secondary | ICD-10-CM | POA: Diagnosis not present

## 2023-02-05 DIAGNOSIS — D6859 Other primary thrombophilia: Secondary | ICD-10-CM

## 2023-02-05 LAB — POCT INR: INR: 2.8 (ref 2.0–3.0)

## 2023-02-05 NOTE — Telephone Encounter (Signed)
 Fax received mdINR PT/INR self testing service Test date/time [id number] 9:21 am INR 2.8

## 2023-02-05 NOTE — Progress Notes (Signed)
 INR 2.8, at goal, continue normal regimen. Repeat INR in 1 week

## 2023-02-05 NOTE — Telephone Encounter (Signed)
 Patient aware and verbalized understanding.

## 2023-02-11 ENCOUNTER — Ambulatory Visit (INDEPENDENT_AMBULATORY_CARE_PROVIDER_SITE_OTHER): Payer: Medicare PPO | Admitting: Family Medicine

## 2023-02-11 ENCOUNTER — Telehealth: Payer: Self-pay | Admitting: *Deleted

## 2023-02-11 DIAGNOSIS — D6859 Other primary thrombophilia: Secondary | ICD-10-CM

## 2023-02-11 DIAGNOSIS — I482 Chronic atrial fibrillation, unspecified: Secondary | ICD-10-CM

## 2023-02-11 DIAGNOSIS — Z86718 Personal history of other venous thrombosis and embolism: Secondary | ICD-10-CM

## 2023-02-11 LAB — POCT INR: INR: 2 (ref 2.0–3.0)

## 2023-02-11 NOTE — Progress Notes (Signed)
 INR 2.0, at goal, continue normal regimen. Repeat INR in 1 week

## 2023-02-11 NOTE — Telephone Encounter (Signed)
 Patient aware and verbalizes understanding.

## 2023-02-11 NOTE — Telephone Encounter (Signed)
 Fax received mdINR PT/INR self testing service Test date/time 02/11/23 907 am INR 2.0

## 2023-02-18 ENCOUNTER — Telehealth: Payer: Self-pay | Admitting: *Deleted

## 2023-02-18 ENCOUNTER — Ambulatory Visit (INDEPENDENT_AMBULATORY_CARE_PROVIDER_SITE_OTHER): Payer: Medicare PPO | Admitting: Family Medicine

## 2023-02-18 DIAGNOSIS — I482 Chronic atrial fibrillation, unspecified: Secondary | ICD-10-CM

## 2023-02-18 DIAGNOSIS — D6859 Other primary thrombophilia: Secondary | ICD-10-CM

## 2023-02-18 DIAGNOSIS — Z86718 Personal history of other venous thrombosis and embolism: Secondary | ICD-10-CM

## 2023-02-18 LAB — POCT INR: INR: 2 (ref 2.0–3.0)

## 2023-02-18 NOTE — Telephone Encounter (Signed)
 Fax received mdINR PT/INR self testing service Test date/time 02/18/23 823 am INR 2.0

## 2023-02-18 NOTE — Telephone Encounter (Signed)
 Patient aware and verbalizes understanding.

## 2023-02-18 NOTE — Progress Notes (Signed)
 INR 2.0, at goal, continue normal regimen. Repeat INR in 1 week

## 2023-02-25 ENCOUNTER — Telehealth: Payer: Self-pay | Admitting: *Deleted

## 2023-02-25 ENCOUNTER — Ambulatory Visit (INDEPENDENT_AMBULATORY_CARE_PROVIDER_SITE_OTHER): Payer: Medicare PPO | Admitting: Family Medicine

## 2023-02-25 DIAGNOSIS — I482 Chronic atrial fibrillation, unspecified: Secondary | ICD-10-CM

## 2023-02-25 DIAGNOSIS — Z86718 Personal history of other venous thrombosis and embolism: Secondary | ICD-10-CM

## 2023-02-25 DIAGNOSIS — E1065 Type 1 diabetes mellitus with hyperglycemia: Secondary | ICD-10-CM | POA: Diagnosis not present

## 2023-02-25 DIAGNOSIS — D6859 Other primary thrombophilia: Secondary | ICD-10-CM

## 2023-02-25 LAB — POCT INR: INR: 1.8 — AB (ref 2.0–3.0)

## 2023-02-25 NOTE — Telephone Encounter (Signed)
Patient aware and verbalizes understanding. 

## 2023-02-25 NOTE — Telephone Encounter (Signed)
Fax received mdINR PT/INR self testing service Test date/time 02/25/23 954 am INR 1.8

## 2023-02-25 NOTE — Progress Notes (Signed)
INR 1.8, not at goal, slightly thick, take 5 mg tonight and then resume normal regimen. Repeat INR in 1 week

## 2023-03-02 ENCOUNTER — Other Ambulatory Visit: Payer: Self-pay | Admitting: Family Medicine

## 2023-03-04 ENCOUNTER — Ambulatory Visit (INDEPENDENT_AMBULATORY_CARE_PROVIDER_SITE_OTHER): Payer: Medicare PPO | Admitting: Family Medicine

## 2023-03-04 ENCOUNTER — Telehealth: Payer: Self-pay | Admitting: *Deleted

## 2023-03-04 DIAGNOSIS — Z86718 Personal history of other venous thrombosis and embolism: Secondary | ICD-10-CM

## 2023-03-04 DIAGNOSIS — I482 Chronic atrial fibrillation, unspecified: Secondary | ICD-10-CM

## 2023-03-04 DIAGNOSIS — D6859 Other primary thrombophilia: Secondary | ICD-10-CM

## 2023-03-04 DIAGNOSIS — Z7901 Long term (current) use of anticoagulants: Secondary | ICD-10-CM | POA: Diagnosis not present

## 2023-03-04 DIAGNOSIS — I4821 Permanent atrial fibrillation: Secondary | ICD-10-CM | POA: Diagnosis not present

## 2023-03-04 LAB — POCT INR: INR: 2.5 (ref 2.0–3.0)

## 2023-03-04 NOTE — Progress Notes (Signed)
INR 2.5, at goal. Resume current regimen. Repeat INR in 1 week

## 2023-03-04 NOTE — Telephone Encounter (Signed)
Fax received mdINR PT/INR self testing service Test date/time 03/04/23 730 am INR 2.5

## 2023-03-04 NOTE — Telephone Encounter (Signed)
Pt has been notified.

## 2023-03-11 ENCOUNTER — Telehealth: Payer: Self-pay | Admitting: *Deleted

## 2023-03-11 ENCOUNTER — Ambulatory Visit (INDEPENDENT_AMBULATORY_CARE_PROVIDER_SITE_OTHER): Payer: Medicare PPO | Admitting: Family Medicine

## 2023-03-11 DIAGNOSIS — I482 Chronic atrial fibrillation, unspecified: Secondary | ICD-10-CM

## 2023-03-11 DIAGNOSIS — D6859 Other primary thrombophilia: Secondary | ICD-10-CM

## 2023-03-11 DIAGNOSIS — Z86718 Personal history of other venous thrombosis and embolism: Secondary | ICD-10-CM

## 2023-03-11 LAB — POCT INR: INR: 2 (ref 2.0–3.0)

## 2023-03-11 NOTE — Telephone Encounter (Signed)
 Fax received mdINR PT/INR self testing service Test date/time 03/11/23 745 am INR 2.0

## 2023-03-11 NOTE — Progress Notes (Signed)
 INR 2.0, at goal. Resume current regimen. Repeat INR in 1 week

## 2023-03-11 NOTE — Telephone Encounter (Signed)
 Pt aware and verbalized understanding.

## 2023-03-18 ENCOUNTER — Telehealth: Payer: Self-pay | Admitting: *Deleted

## 2023-03-18 DIAGNOSIS — I482 Chronic atrial fibrillation, unspecified: Secondary | ICD-10-CM

## 2023-03-18 DIAGNOSIS — D6859 Other primary thrombophilia: Secondary | ICD-10-CM

## 2023-03-18 DIAGNOSIS — Z86718 Personal history of other venous thrombosis and embolism: Secondary | ICD-10-CM

## 2023-03-18 NOTE — Telephone Encounter (Signed)
INR at goal. Continue current regimen.

## 2023-03-18 NOTE — Telephone Encounter (Signed)
Fax received mdINR PT/INR self testing service Test date/time 03/18/23 1125 am INR 2.4

## 2023-03-19 ENCOUNTER — Other Ambulatory Visit: Payer: Self-pay | Admitting: Family Medicine

## 2023-03-19 NOTE — Telephone Encounter (Signed)
Patient aware and verbalized understanding.

## 2023-03-23 DIAGNOSIS — Z1283 Encounter for screening for malignant neoplasm of skin: Secondary | ICD-10-CM | POA: Diagnosis not present

## 2023-03-23 DIAGNOSIS — C44721 Squamous cell carcinoma of skin of unspecified lower limb, including hip: Secondary | ICD-10-CM | POA: Diagnosis not present

## 2023-03-23 DIAGNOSIS — Z08 Encounter for follow-up examination after completed treatment for malignant neoplasm: Secondary | ICD-10-CM | POA: Diagnosis not present

## 2023-03-23 DIAGNOSIS — Z85820 Personal history of malignant melanoma of skin: Secondary | ICD-10-CM | POA: Diagnosis not present

## 2023-03-23 DIAGNOSIS — D225 Melanocytic nevi of trunk: Secondary | ICD-10-CM | POA: Diagnosis not present

## 2023-03-23 DIAGNOSIS — L820 Inflamed seborrheic keratosis: Secondary | ICD-10-CM | POA: Diagnosis not present

## 2023-03-24 DIAGNOSIS — E1165 Type 2 diabetes mellitus with hyperglycemia: Secondary | ICD-10-CM | POA: Diagnosis not present

## 2023-03-24 DIAGNOSIS — E1065 Type 1 diabetes mellitus with hyperglycemia: Secondary | ICD-10-CM | POA: Diagnosis not present

## 2023-03-24 DIAGNOSIS — G629 Polyneuropathy, unspecified: Secondary | ICD-10-CM | POA: Diagnosis not present

## 2023-03-25 ENCOUNTER — Ambulatory Visit (INDEPENDENT_AMBULATORY_CARE_PROVIDER_SITE_OTHER): Payer: Medicare PPO | Admitting: Family Medicine

## 2023-03-25 ENCOUNTER — Telehealth: Payer: Self-pay | Admitting: *Deleted

## 2023-03-25 DIAGNOSIS — I482 Chronic atrial fibrillation, unspecified: Secondary | ICD-10-CM

## 2023-03-25 DIAGNOSIS — D6859 Other primary thrombophilia: Secondary | ICD-10-CM

## 2023-03-25 DIAGNOSIS — Z86718 Personal history of other venous thrombosis and embolism: Secondary | ICD-10-CM

## 2023-03-25 LAB — POCT INR: INR: 2.7 (ref 2.0–3.0)

## 2023-03-25 NOTE — Telephone Encounter (Signed)
 Patient aware and verbalizes understanding.

## 2023-03-25 NOTE — Progress Notes (Signed)
INR 2.7, at goal. Resume current regimen. Repeat INR in 1 week

## 2023-03-25 NOTE — Telephone Encounter (Signed)
Fax received mdINR PT/INR self testing service Test date/time 03/25/23 919 am INR 2.7

## 2023-03-27 ENCOUNTER — Encounter: Payer: Self-pay | Admitting: Pulmonary Disease

## 2023-03-28 DIAGNOSIS — E1065 Type 1 diabetes mellitus with hyperglycemia: Secondary | ICD-10-CM | POA: Diagnosis not present

## 2023-03-31 DIAGNOSIS — I10 Essential (primary) hypertension: Secondary | ICD-10-CM | POA: Diagnosis not present

## 2023-03-31 DIAGNOSIS — E1065 Type 1 diabetes mellitus with hyperglycemia: Secondary | ICD-10-CM | POA: Diagnosis not present

## 2023-03-31 DIAGNOSIS — E162 Hypoglycemia, unspecified: Secondary | ICD-10-CM | POA: Diagnosis not present

## 2023-03-31 DIAGNOSIS — E7800 Pure hypercholesterolemia, unspecified: Secondary | ICD-10-CM | POA: Diagnosis not present

## 2023-03-31 DIAGNOSIS — G629 Polyneuropathy, unspecified: Secondary | ICD-10-CM | POA: Diagnosis not present

## 2023-03-31 DIAGNOSIS — Z4681 Encounter for fitting and adjustment of insulin pump: Secondary | ICD-10-CM | POA: Diagnosis not present

## 2023-04-01 ENCOUNTER — Ambulatory Visit (INDEPENDENT_AMBULATORY_CARE_PROVIDER_SITE_OTHER): Payer: Medicare PPO | Admitting: Family Medicine

## 2023-04-01 ENCOUNTER — Telehealth: Payer: Self-pay | Admitting: *Deleted

## 2023-04-01 DIAGNOSIS — I482 Chronic atrial fibrillation, unspecified: Secondary | ICD-10-CM

## 2023-04-01 DIAGNOSIS — D6859 Other primary thrombophilia: Secondary | ICD-10-CM

## 2023-04-01 DIAGNOSIS — Z86718 Personal history of other venous thrombosis and embolism: Secondary | ICD-10-CM

## 2023-04-01 DIAGNOSIS — I4821 Permanent atrial fibrillation: Secondary | ICD-10-CM | POA: Diagnosis not present

## 2023-04-01 DIAGNOSIS — Z7901 Long term (current) use of anticoagulants: Secondary | ICD-10-CM | POA: Diagnosis not present

## 2023-04-01 LAB — POCT INR: INR: 2.3 (ref 2.0–3.0)

## 2023-04-01 NOTE — Progress Notes (Signed)
 INR 2.3, at goal. Resume current regimen. Repeat INR in 1 week

## 2023-04-01 NOTE — Telephone Encounter (Signed)
 Fax received mdINR PT/INR self testing service Test date/time 04/01/23 1104 am INR 2.3

## 2023-04-01 NOTE — Telephone Encounter (Signed)
 Patient aware and verbalizes understanding.

## 2023-04-08 ENCOUNTER — Telehealth: Payer: Self-pay | Admitting: Primary Care

## 2023-04-08 NOTE — Telephone Encounter (Signed)
 This patient has not been seen since 2023.   They will need an OV to discuss these tests. Not sure how the office handles WC appts.

## 2023-04-08 NOTE — Telephone Encounter (Signed)
 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 No order is in place for these tests. I asked her if our Dr.'s told him to get these test and she said she did not know. I see nothing in the activity tab or on the AVS. PT is OD for a Fu appt so I advised her to make the appt and we would order tests if needed at that point. Please let [name] know so she can be advised and review PT's chart. TY.

## 2023-04-09 ENCOUNTER — Ambulatory Visit (INDEPENDENT_AMBULATORY_CARE_PROVIDER_SITE_OTHER): Admitting: Family Medicine

## 2023-04-09 ENCOUNTER — Telehealth: Payer: Self-pay | Admitting: *Deleted

## 2023-04-09 DIAGNOSIS — D6859 Other primary thrombophilia: Secondary | ICD-10-CM

## 2023-04-09 DIAGNOSIS — Z86718 Personal history of other venous thrombosis and embolism: Secondary | ICD-10-CM

## 2023-04-09 DIAGNOSIS — I482 Chronic atrial fibrillation, unspecified: Secondary | ICD-10-CM | POA: Diagnosis not present

## 2023-04-09 LAB — POCT INR: INR: 2 (ref 2.0–3.0)

## 2023-04-09 NOTE — Progress Notes (Signed)
 INR 2.0, at goal. Resume current regimen. Repeat INR in 1 week

## 2023-04-09 NOTE — Telephone Encounter (Signed)
 Fax received mdINR PT/INR self testing service Test date/time 04/08/23 0502 pm INR 2.0

## 2023-04-09 NOTE — Telephone Encounter (Signed)
 Pt has been notified.

## 2023-04-15 ENCOUNTER — Telehealth: Payer: Self-pay | Admitting: *Deleted

## 2023-04-15 ENCOUNTER — Ambulatory Visit (INDEPENDENT_AMBULATORY_CARE_PROVIDER_SITE_OTHER): Admitting: Family Medicine

## 2023-04-15 DIAGNOSIS — Z86718 Personal history of other venous thrombosis and embolism: Secondary | ICD-10-CM

## 2023-04-15 DIAGNOSIS — D6859 Other primary thrombophilia: Secondary | ICD-10-CM

## 2023-04-15 DIAGNOSIS — I482 Chronic atrial fibrillation, unspecified: Secondary | ICD-10-CM

## 2023-04-15 LAB — POCT INR: INR: 2.6 (ref 2.0–3.0)

## 2023-04-15 NOTE — Telephone Encounter (Signed)
 Fax received mdINR PT/INR self testing service Test date/time 04/15/23 737 am INR 2.6

## 2023-04-15 NOTE — Telephone Encounter (Signed)
 Patient aware and verbalizes understanding.

## 2023-04-15 NOTE — Progress Notes (Signed)
 INR 2.6 at goal. Resume current regimen. Repeat INR in 1 week

## 2023-04-16 ENCOUNTER — Ambulatory Visit: Admitting: Primary Care

## 2023-04-16 ENCOUNTER — Encounter: Payer: Self-pay | Admitting: Primary Care

## 2023-04-16 VITALS — BP 156/84 | HR 55 | Ht 71.0 in | Wt 165.4 lb

## 2023-04-16 DIAGNOSIS — Z77090 Contact with and (suspected) exposure to asbestos: Secondary | ICD-10-CM | POA: Diagnosis not present

## 2023-04-16 NOTE — Patient Instructions (Addendum)
-  PULMONARY NODULE: A 6 mm nodule was found in your left lower lung on the recent CT scan. This nodule is likely benign, meaning it is not harmful, and could be due to inflammation or a past infection. Since it is too small for a biopsy, we will repeat the chest CT scan in June 2025 to monitor it. We will continue periodic CT scans for the next two years to check for any changes.   -ASBESTOS EXPOSURE: You have a history of asbestos exposure from your work, but your recent CT scan shows no signs of asbestos-related disease. Your lung function is normal, and you have no symptoms. We will continue with annual chest imaging and periodic lung function tests to monitor your health. Please let us know if you develop any respiratory symptoms so we can address them promptly.  -NORMAL PULMONARY FUNCTION: Your pulmonary function test from December 2023 shows that your lung function and diffusion capacity are normal, with no significant changes from previous tests. We will verify the results of your December 2024 breathing test and perform a walk test to assess your oxygen levels.  INSTRUCTIONS:  Please schedule a follow-up chest CT scan for June 2025. Continue with your annual chest imaging and periodic lung function tests as part of your asbestos exposure surveillance program. If you experience any respiratory symptoms, contact us immediately.  Follow-up 1 year with PFTs prior / Dr. Francine Graven
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
## 2023-04-16 NOTE — Telephone Encounter (Signed)
 NFN

## 2023-04-22 ENCOUNTER — Ambulatory Visit (INDEPENDENT_AMBULATORY_CARE_PROVIDER_SITE_OTHER): Admitting: Family Medicine

## 2023-04-22 ENCOUNTER — Telehealth: Payer: Self-pay | Admitting: *Deleted

## 2023-04-22 DIAGNOSIS — Z86718 Personal history of other venous thrombosis and embolism: Secondary | ICD-10-CM

## 2023-04-22 DIAGNOSIS — I482 Chronic atrial fibrillation, unspecified: Secondary | ICD-10-CM

## 2023-04-22 DIAGNOSIS — D6859 Other primary thrombophilia: Secondary | ICD-10-CM

## 2023-04-22 LAB — POCT INR: INR: 2.8 (ref 2.0–3.0)

## 2023-04-22 NOTE — Telephone Encounter (Signed)
 Patient aware and verbalizes understanding.

## 2023-04-22 NOTE — Progress Notes (Signed)
 INR 2.8 at goal. Resume current regimen. Repeat INR in 1 week

## 2023-04-22 NOTE — Telephone Encounter (Signed)
 Fax received mdINR PT/INR self testing service Test date/time 04/22/23 757 am INR 2.8

## 2023-04-29 ENCOUNTER — Ambulatory Visit (INDEPENDENT_AMBULATORY_CARE_PROVIDER_SITE_OTHER): Admitting: Family Medicine

## 2023-04-29 ENCOUNTER — Telehealth: Payer: Self-pay | Admitting: *Deleted

## 2023-04-29 DIAGNOSIS — D6859 Other primary thrombophilia: Secondary | ICD-10-CM

## 2023-04-29 DIAGNOSIS — Z86718 Personal history of other venous thrombosis and embolism: Secondary | ICD-10-CM

## 2023-04-29 DIAGNOSIS — I482 Chronic atrial fibrillation, unspecified: Secondary | ICD-10-CM

## 2023-04-29 DIAGNOSIS — I4821 Permanent atrial fibrillation: Secondary | ICD-10-CM | POA: Diagnosis not present

## 2023-04-29 DIAGNOSIS — Z7901 Long term (current) use of anticoagulants: Secondary | ICD-10-CM | POA: Diagnosis not present

## 2023-04-29 LAB — POCT INR: INR: 2.2 (ref 2.0–3.0)

## 2023-04-29 NOTE — Progress Notes (Signed)
 INR 2.2 at goal. Resume current regimen. Repeat INR in 1 week

## 2023-04-29 NOTE — Telephone Encounter (Signed)
 Patient aware and verbalizes understanding.

## 2023-04-29 NOTE — Telephone Encounter (Signed)
 Fax received mdINR PT/INR self testing service Test date/time 04/29/23 809 am INR 2.2

## 2023-05-04 DIAGNOSIS — C44729 Squamous cell carcinoma of skin of left lower limb, including hip: Secondary | ICD-10-CM | POA: Diagnosis not present

## 2023-05-07 ENCOUNTER — Telehealth: Payer: Self-pay | Admitting: *Deleted

## 2023-05-07 ENCOUNTER — Ambulatory Visit (INDEPENDENT_AMBULATORY_CARE_PROVIDER_SITE_OTHER): Admitting: Family Medicine

## 2023-05-07 ENCOUNTER — Telehealth: Payer: Self-pay

## 2023-05-07 ENCOUNTER — Telehealth: Payer: Self-pay | Admitting: Family Medicine

## 2023-05-07 DIAGNOSIS — D6859 Other primary thrombophilia: Secondary | ICD-10-CM

## 2023-05-07 DIAGNOSIS — I482 Chronic atrial fibrillation, unspecified: Secondary | ICD-10-CM

## 2023-05-07 DIAGNOSIS — Z86718 Personal history of other venous thrombosis and embolism: Secondary | ICD-10-CM

## 2023-05-07 LAB — POCT INR: INR: 1.7 — AB (ref 2.0–3.0)

## 2023-05-07 NOTE — Telephone Encounter (Signed)
 Copied from CRM [id number]. Topic: General - Other >> May 07, 2023  1:18 PM Emylou G wrote: Reason for CRM: Patient called.. want to advise doctor he has had diabetic test

## 2023-05-07 NOTE — Telephone Encounter (Signed)
 Lmtcb

## 2023-05-07 NOTE — Progress Notes (Signed)
 INR 1.7, not at goal, slightly thick, take an extra half a tablet tonight and then resume current regimen. Repeat INR in 1 week.

## 2023-05-07 NOTE — Telephone Encounter (Signed)
 Refer to other phone call

## 2023-05-07 NOTE — Telephone Encounter (Signed)
 Fax received mdINR PT/INR self testing service Test date/time 05/07/23 1233 pm INR 1.7

## 2023-05-08 ENCOUNTER — Encounter

## 2023-05-08 NOTE — Progress Notes (Signed)
 This encounter was created in error - please disregard.

## 2023-05-11 NOTE — Telephone Encounter (Signed)
 Reviewed providers result notes with patient. Patient voiced understanding.

## 2023-05-11 NOTE — Telephone Encounter (Signed)
 lmtcb

## 2023-05-14 ENCOUNTER — Ambulatory Visit (INDEPENDENT_AMBULATORY_CARE_PROVIDER_SITE_OTHER): Admitting: Family Medicine

## 2023-05-14 ENCOUNTER — Telehealth: Payer: Self-pay | Admitting: *Deleted

## 2023-05-14 DIAGNOSIS — I482 Chronic atrial fibrillation, unspecified: Secondary | ICD-10-CM

## 2023-05-14 DIAGNOSIS — Z86718 Personal history of other venous thrombosis and embolism: Secondary | ICD-10-CM

## 2023-05-14 DIAGNOSIS — D6859 Other primary thrombophilia: Secondary | ICD-10-CM

## 2023-05-14 LAB — POCT INR: INR: 1.7 — AB (ref 2.0–3.0)

## 2023-05-14 NOTE — Telephone Encounter (Signed)
 Patient aware and verbalizes understanding.

## 2023-05-14 NOTE — Progress Notes (Signed)
 INR 1.7, not at goal, slightly thick. Take 7.5 mg today and then repeat INR in 1 week.

## 2023-05-14 NOTE — Telephone Encounter (Signed)
 Fax received mdINR PT/INR self testing service Test date/time 05/14/23 853 am INR 1.7

## 2023-05-20 ENCOUNTER — Ambulatory Visit (INDEPENDENT_AMBULATORY_CARE_PROVIDER_SITE_OTHER): Admitting: Family Medicine

## 2023-05-20 ENCOUNTER — Telehealth: Payer: Self-pay | Admitting: *Deleted

## 2023-05-20 DIAGNOSIS — Z86718 Personal history of other venous thrombosis and embolism: Secondary | ICD-10-CM

## 2023-05-20 DIAGNOSIS — D6859 Other primary thrombophilia: Secondary | ICD-10-CM

## 2023-05-20 DIAGNOSIS — I482 Chronic atrial fibrillation, unspecified: Secondary | ICD-10-CM

## 2023-05-20 LAB — POCT INR: INR: 2.1 (ref 2.0–3.0)

## 2023-05-20 NOTE — Telephone Encounter (Signed)
 Fax received mdINR PT/INR self testing service Test date/time 05/20/23 809 am INR 2.1

## 2023-05-20 NOTE — Telephone Encounter (Signed)
 Lmtcb 4/16 LS

## 2023-05-20 NOTE — Progress Notes (Signed)
 INR 2.1, at goal.  Continue 7.5 mg on Thursday, 5 mg on Tuesday, Wednesday, Saturday, and 2.5 mg on Sunday, Monday, Friday

## 2023-05-25 ENCOUNTER — Telehealth: Payer: Self-pay | Admitting: Family Medicine

## 2023-05-25 NOTE — Telephone Encounter (Signed)
 Copied from CRM [id number]. Topic: General - Other >> May 22, 2023  3:21 PM Felizardo Hotter wrote: Reason for CRM: Pt returning Leah's call, provided pt lab results, but pt had bad reception on phone. Pt will call back

## 2023-05-25 NOTE — Telephone Encounter (Signed)
Called pt , no answer left vm for cb

## 2023-05-27 ENCOUNTER — Ambulatory Visit (INDEPENDENT_AMBULATORY_CARE_PROVIDER_SITE_OTHER): Admitting: Family Medicine

## 2023-05-27 ENCOUNTER — Telehealth: Payer: Self-pay | Admitting: *Deleted

## 2023-05-27 DIAGNOSIS — Z86718 Personal history of other venous thrombosis and embolism: Secondary | ICD-10-CM

## 2023-05-27 DIAGNOSIS — Z7901 Long term (current) use of anticoagulants: Secondary | ICD-10-CM | POA: Diagnosis not present

## 2023-05-27 DIAGNOSIS — D6859 Other primary thrombophilia: Secondary | ICD-10-CM

## 2023-05-27 DIAGNOSIS — I482 Chronic atrial fibrillation, unspecified: Secondary | ICD-10-CM | POA: Diagnosis not present

## 2023-05-27 DIAGNOSIS — I4821 Permanent atrial fibrillation: Secondary | ICD-10-CM | POA: Diagnosis not present

## 2023-05-27 LAB — POCT INR: INR: 2.1 (ref 2.0–3.0)

## 2023-05-27 NOTE — Progress Notes (Addendum)
 INR 2.1, at goal.  Continue 7.5 mg on Thursday, 5 mg on Tuesday, Wednesday, Saturday, and 2.5 mg on Sunday, Monday, Friday  Pt states he is taking the below regimen: Monday patient takes 2.5mg  Tuesday 5mg  Wednesday 2.5mg  Thursday 3.75mg  (1 and 1/2 tab) Friday 2.5mg  Saturday 3.75mg  Sunday 2.5mg   Calendar updated today.  Kattie Parrot, FNP-C [facility] [address] [place], [place] [address] [phone]

## 2023-05-27 NOTE — Telephone Encounter (Signed)
Aware of results and recommendations  

## 2023-05-27 NOTE — Telephone Encounter (Signed)
 Fax received mdINR PT/INR self testing service Test date/time 05/27/23 1126 am INR 2.1

## 2023-05-28 NOTE — Telephone Encounter (Signed)
 Refer to phone call from 4/23

## 2023-06-01 NOTE — Telephone Encounter (Signed)
 Refer to telephone call from 4/23

## 2023-06-03 ENCOUNTER — Ambulatory Visit (INDEPENDENT_AMBULATORY_CARE_PROVIDER_SITE_OTHER): Admitting: Family Medicine

## 2023-06-03 ENCOUNTER — Telehealth: Payer: Self-pay | Admitting: *Deleted

## 2023-06-03 DIAGNOSIS — D6859 Other primary thrombophilia: Secondary | ICD-10-CM

## 2023-06-03 DIAGNOSIS — Z86718 Personal history of other venous thrombosis and embolism: Secondary | ICD-10-CM

## 2023-06-03 DIAGNOSIS — I482 Chronic atrial fibrillation, unspecified: Secondary | ICD-10-CM | POA: Diagnosis not present

## 2023-06-03 LAB — POCT INR: INR: 2.8 (ref 2.0–3.0)

## 2023-06-03 NOTE — Telephone Encounter (Signed)
 Patient aware and verbalized understanding. Wife states they have their on routine and they never go by 7.5 they will just do what they do fyi

## 2023-06-03 NOTE — Progress Notes (Signed)
 INR 2.8, at goal.  Continue 7.5 mg on Thursday, 5 mg on Tuesday, Wednesday, Saturday, and 2.5 mg on Sunday, Monday, Friday

## 2023-06-03 NOTE — Telephone Encounter (Signed)
 Fax received mdINR PT/INR self testing service Test date/time 06/03/23 941 am INR 2.8

## 2023-06-03 NOTE — Telephone Encounter (Signed)
 Monday patient takes 2.5mg  Tuesday 5mg  Wednesday 2.5mg  Thursday 3.75mg  (1 and 1/2 tab) Friday 2.5mg  Saturday 3.75mg  Sunday 2.5mg 

## 2023-06-05 ENCOUNTER — Other Ambulatory Visit: Payer: Self-pay | Admitting: Cardiovascular Disease

## 2023-06-09 ENCOUNTER — Ambulatory Visit: Payer: Medicare PPO | Admitting: Family Medicine

## 2023-06-09 ENCOUNTER — Encounter: Payer: Self-pay | Admitting: Family Medicine

## 2023-06-09 VITALS — BP 128/78 | HR 62 | Temp 97.3°F | Ht 71.0 in | Wt 163.4 lb

## 2023-06-09 DIAGNOSIS — Z0001 Encounter for general adult medical examination with abnormal findings: Secondary | ICD-10-CM

## 2023-06-09 DIAGNOSIS — Z86718 Personal history of other venous thrombosis and embolism: Secondary | ICD-10-CM

## 2023-06-09 DIAGNOSIS — Z0000 Encounter for general adult medical examination without abnormal findings: Secondary | ICD-10-CM

## 2023-06-09 DIAGNOSIS — I482 Chronic atrial fibrillation, unspecified: Secondary | ICD-10-CM | POA: Diagnosis not present

## 2023-06-09 DIAGNOSIS — J61 Pneumoconiosis due to asbestos and other mineral fibers: Secondary | ICD-10-CM

## 2023-06-09 DIAGNOSIS — Z794 Long term (current) use of insulin: Secondary | ICD-10-CM | POA: Diagnosis not present

## 2023-06-09 DIAGNOSIS — E119 Type 2 diabetes mellitus without complications: Secondary | ICD-10-CM | POA: Diagnosis not present

## 2023-06-09 DIAGNOSIS — E1159 Type 2 diabetes mellitus with other circulatory complications: Secondary | ICD-10-CM | POA: Diagnosis not present

## 2023-06-09 DIAGNOSIS — E1169 Type 2 diabetes mellitus with other specified complication: Secondary | ICD-10-CM

## 2023-06-09 DIAGNOSIS — C439 Malignant melanoma of skin, unspecified: Secondary | ICD-10-CM | POA: Diagnosis not present

## 2023-06-09 DIAGNOSIS — I152 Hypertension secondary to endocrine disorders: Secondary | ICD-10-CM | POA: Diagnosis not present

## 2023-06-09 DIAGNOSIS — E1165 Type 2 diabetes mellitus with hyperglycemia: Secondary | ICD-10-CM

## 2023-06-09 DIAGNOSIS — D6859 Other primary thrombophilia: Secondary | ICD-10-CM

## 2023-06-09 DIAGNOSIS — E559 Vitamin D deficiency, unspecified: Secondary | ICD-10-CM | POA: Diagnosis not present

## 2023-06-09 DIAGNOSIS — E538 Deficiency of other specified B group vitamins: Secondary | ICD-10-CM

## 2023-06-09 DIAGNOSIS — R351 Nocturia: Secondary | ICD-10-CM

## 2023-06-09 DIAGNOSIS — E1141 Type 2 diabetes mellitus with diabetic mononeuropathy: Secondary | ICD-10-CM

## 2023-06-09 LAB — BAYER DCA HB A1C WAIVED: HB A1C (BAYER DCA - WAIVED): 7 % — ABNORMAL HIGH (ref 4.8–5.6)

## 2023-06-09 MED ORDER — WARFARIN SODIUM 2.5 MG PO TABS: 2.5000 mg | ORAL_TABLET | Freq: Once | ORAL | 1 refills | Status: DC

## 2023-06-09 MED ORDER — ATORVASTATIN CALCIUM 20 MG PO TABS: 20.0000 mg | ORAL_TABLET | Freq: Every day | ORAL | 1 refills | Status: DC

## 2023-06-09 NOTE — Addendum Note (Signed)
 Addended by: Galvin Jules on: 06/09/2023 10:19 AM   Modules accepted: Orders
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
## 2023-06-10 LAB — THYROID PANEL WITH TSH
Free Thyroxine Index: 1.6 (ref 1.2–4.9)
T3 Uptake Ratio: 26 % (ref 24–39)
T4, Total: 6.3 ug/dL (ref 4.5–12.0)
TSH: 1.74 u[IU]/mL (ref 0.450–4.500)

## 2023-06-10 LAB — CBC WITH DIFFERENTIAL/PLATELET
Basophils Absolute: 0 10*3/uL (ref 0.0–0.2)
Basos: 1 %
EOS (ABSOLUTE): 0.1 10*3/uL (ref 0.0–0.4)
Eos: 2 %
Hematocrit: 40.5 % (ref 37.5–51.0)
Hemoglobin: 13.6 g/dL (ref 13.0–17.7)
Immature Grans (Abs): 0 10*3/uL (ref 0.0–0.1)
Immature Granulocytes: 0 %
Lymphocytes Absolute: 1.1 10*3/uL (ref 0.7–3.1)
Lymphs: 23 %
MCH: 32 pg (ref 26.6–33.0)
MCHC: 33.6 g/dL (ref 31.5–35.7)
MCV: 95 fL (ref 79–97)
Monocytes Absolute: 0.3 10*3/uL (ref 0.1–0.9)
Monocytes: 7 %
Neutrophils Absolute: 3.4 10*3/uL (ref 1.4–7.0)
Neutrophils: 67 %
Platelets: 166 10*3/uL (ref 150–450)
RBC: 4.25 x10E6/uL (ref 4.14–5.80)
RDW: 12.4 % (ref 11.6–15.4)
WBC: 5 10*3/uL (ref 3.4–10.8)

## 2023-06-10 LAB — LIPID PANEL
Chol/HDL Ratio: 2.3 ratio (ref 0.0–5.0)
Cholesterol, Total: 145 mg/dL (ref 100–199)
HDL: 62 mg/dL (ref >39)
LDL Chol Calc (NIH): 73 mg/dL (ref 0–99)
Triglycerides: 43 mg/dL (ref 0–149)
VLDL Cholesterol Cal: 10 mg/dL (ref 5–40)

## 2023-06-10 LAB — CMP14+EGFR
ALT: 24 IU/L (ref 0–44)
AST: 26 IU/L (ref 0–40)
Albumin: 4.4 g/dL (ref 3.8–4.8)
Alkaline Phosphatase: 75 IU/L (ref 44–121)
BUN/Creatinine Ratio: 26 — ABNORMAL HIGH (ref 10–24)
BUN: 25 mg/dL (ref 8–27)
Bilirubin Total: 0.7 mg/dL (ref 0.0–1.2)
CO2: 24 mmol/L (ref 20–29)
Calcium: 9.7 mg/dL (ref 8.6–10.2)
Chloride: 102 mmol/L (ref 96–106)
Creatinine, Ser: 0.95 mg/dL (ref 0.76–1.27)
Globulin, Total: 2.3 g/dL (ref 1.5–4.5)
Glucose: 205 mg/dL — ABNORMAL HIGH (ref 70–99)
Potassium: 4.5 mmol/L (ref 3.5–5.2)
Sodium: 138 mmol/L (ref 134–144)
Total Protein: 6.7 g/dL (ref 6.0–8.5)
eGFR: 85 mL/min/{1.73_m2} (ref >59)

## 2023-06-10 LAB — PSA, TOTAL AND FREE
PSA, Free Pct: 18 %
PSA, Free: 0.18 ng/mL
Prostate Specific Ag, Serum: 1 ng/mL (ref 0.0–4.0)

## 2023-06-10 LAB — MICROALBUMIN / CREATININE URINE RATIO
Creatinine, Urine: 78.7 mg/dL
Microalb/Creat Ratio: 23 mg/g{creat} (ref 0–29)
Microalbumin, Urine: 17.9 ug/mL

## 2023-06-10 LAB — VITAMIN B12: Vitamin B-12: 493 pg/mL (ref 232–1245)

## 2023-06-10 LAB — VITAMIN D 25 HYDROXY (VIT D DEFICIENCY, FRACTURES): Vit D, 25-Hydroxy: 54.8 ng/mL (ref 30.0–100.0)

## 2023-06-11 ENCOUNTER — Ambulatory Visit (INDEPENDENT_AMBULATORY_CARE_PROVIDER_SITE_OTHER): Admitting: Family Medicine

## 2023-06-11 ENCOUNTER — Telehealth: Payer: Self-pay | Admitting: *Deleted

## 2023-06-11 DIAGNOSIS — D6859 Other primary thrombophilia: Secondary | ICD-10-CM

## 2023-06-11 DIAGNOSIS — I482 Chronic atrial fibrillation, unspecified: Secondary | ICD-10-CM

## 2023-06-11 DIAGNOSIS — Z86718 Personal history of other venous thrombosis and embolism: Secondary | ICD-10-CM

## 2023-06-11 LAB — POCT INR: INR: 1.7 — AB (ref 2.0–3.0)

## 2023-06-11 NOTE — Telephone Encounter (Signed)
 Spoke with pt ,voiced understanding.

## 2023-06-11 NOTE — Telephone Encounter (Signed)
 Fax received [facility] PT/INR self testing service Test date/time 06/11/23 801 INR 1.7

## 2023-06-11 NOTE — Telephone Encounter (Signed)
 Increase tonight's dose to 5 mg and then resume normal regimen. Repeat INR In 1 week

## 2023-06-15 ENCOUNTER — Ambulatory Visit: Attending: Cardiovascular Disease | Admitting: Cardiovascular Disease

## 2023-06-15 ENCOUNTER — Encounter: Payer: Self-pay | Admitting: Cardiovascular Disease

## 2023-06-15 VITALS — BP 160/82 | HR 69 | Ht 72.0 in | Wt 161.0 lb

## 2023-06-15 DIAGNOSIS — I482 Chronic atrial fibrillation, unspecified: Secondary | ICD-10-CM | POA: Diagnosis not present

## 2023-06-15 DIAGNOSIS — E785 Hyperlipidemia, unspecified: Secondary | ICD-10-CM

## 2023-06-15 DIAGNOSIS — E7800 Pure hypercholesterolemia, unspecified: Secondary | ICD-10-CM

## 2023-06-15 DIAGNOSIS — E1169 Type 2 diabetes mellitus with other specified complication: Secondary | ICD-10-CM

## 2023-06-15 DIAGNOSIS — I10 Essential (primary) hypertension: Secondary | ICD-10-CM

## 2023-06-15 NOTE — Progress Notes (Signed)
 06/15/2023 Katherne Pander   [birth year]  [id number]  Primary Physician Rakes, Georgeann Kindred, FNP Primary Cardiologist: Avanell Leigh MD Lathan Poke, [place], [place]  HPI:  Christopher Avery is a 73 y.o.     thin and fit-appearing married Caucasian male, father of 2, grandfather of 4 grandchildren, who I saw 06/18/2022... His mother-in-law, Crista Domino, was a patient of mine for a long time and died 06/29/2016.  He is accompanied by his wife Lenon Radar today, daughter of Crista Domino. He has a history of paroxysmal atrial fibrillation, maintaining sinus rhythm on Coumadin  anticoagulation  His other problems include hypertension, hyperlipidemia, and family history of heart disease.    Since I saw him a year ago he continues to do well.  He remains in chronic A-fib rate controlled on Coumadin  anticoagulation.  He checks his INR at home.  He denies chest pain or shortness of breath.  They were growing grapes for wine production but they have since given this.  Current Meds  Medication Sig   atorvastatin  (LIPITOR) 20 MG tablet Take 1 tablet (20 mg total) by mouth daily.   cetirizine  (ZYRTEC  ALLERGY) 10 MG tablet Take 1 tablet (10 mg total) by mouth at bedtime.   Cholecalciferol (VITAMIN D3) 125 MCG (5000 UT) CAPS Take 1 capsule by mouth daily. Takes 6 days a week   co-enzyme Q-10 30 MG capsule Take 100 mg by mouth daily.   Continuous Blood Gluc Sensor (FREESTYLE LIBRE 2 SENSOR) MISC by Does not apply route.   Continuous Glucose Sensor (GUARDIAN 4 GLUCOSE SENSOR) MISC by Does not apply route.   diltiazem  (CARDIZEM  CD) 240 MG 24 hr capsule TAKE 1 CAPSULE BY MOUTH EVERY DAY   fluticasone  (FLONASE ) 50 MCG/ACT nasal spray Place 2 sprays into both nostrils daily.   Insulin  Glargine (BASAGLAR  KWIKPEN) 100 UNIT/ML Inject 19 Units into the skin daily.   Insulin  Pen Needle (BD PEN NEEDLE NANO 2ND GEN) 32G X 4 MM MISC USE WITH INSULIN  4 TIMES A DAY DX E11.9   NOVOLOG  FLEXPEN 100 UNIT/ML FlexPen     sildenafil  (VIAGRA ) 100 MG tablet Take 0.5-1 tablets (50-100 mg total) by mouth daily as needed for erectile dysfunction.   valACYclovir  (VALTREX ) 500 MG tablet TAKE 4 TABLETS BY MOUTH TWICE DAILY AS DIRECTED AND AS NEEDED     Allergies  Allergen Reactions   Januvia [Sitagliptin] Hives    Social History   Socioeconomic History   Marital status: Married    Spouse name: Lenon Radar   Number of children: 2   Years of education: 14 years   Highest education level: Some college, no degree  Occupational History   Occupation: Retired    Associate Professor: [employer]  Tobacco Use   Smoking status: Never   Smokeless tobacco: Never  Vaping Use   Vaping status: Never Used  Substance and Sexual Activity   Alcohol use: Never    Alcohol/week: 0.0 standard drinks of alcohol   Drug use: No   Sexual activity: Yes  Other Topics Concern   Not on file  Social History Narrative   Lives with wife.   Social Drivers of Corporate investment banker Strain: Low Risk  (05/19/2022)   Overall Financial Resource Strain (CARDIA)    Difficulty of Paying Living Expenses: Not hard at all  Food Insecurity: Low Risk  (03/31/2023)   Received from [facility]   Hunger Vital Sign    Worried About Running Out of Food in the Last Year:  Never true    Ran Out of Food in the Last Year: Never true  Transportation Needs: No Transportation Needs (03/31/2023)   Received from [facility]   Transportation    In the past 12 months, has lack of reliable transportation kept you from medical appointments, meetings, work or from getting things needed for daily living? : No  Physical Activity: Sufficiently Active (05/19/2022)   Exercise Vital Sign    Days of Exercise per Week: 5 days    Minutes of Exercise per Session: 30 min  Stress: No Stress Concern Present (05/19/2022)   [employer] of Occupational Health - Occupational Stress Questionnaire    Feeling of Stress : Not at all  Social Connections: Socially Integrated  (05/19/2022)   Social Connection and Isolation Panel [NHANES]    Frequency of Communication with Friends and Family: More than three times a week    Frequency of Social Gatherings with Friends and Family: More than three times a week    Attends Religious Services: More than 4 times per year    Active Member of [employer] or Organizations: Yes    Attends Banker Meetings: More than 4 times per year    Marital Status: Married  Catering manager Violence: Not At Risk (05/19/2022)   Humiliation, Afraid, Rape, and Kick questionnaire    Fear of Current or Ex-Partner: No    Emotionally Abused: No    Physically Abused: No    Sexually Abused: No     Review of Systems: General: negative for chills, fever, night sweats or weight changes.  Cardiovascular: negative for chest pain, dyspnea on exertion, edema, orthopnea, palpitations, paroxysmal nocturnal dyspnea or shortness of breath Dermatological: negative for rash Respiratory: negative for cough or wheezing Urologic: negative for hematuria Abdominal: negative for nausea, vomiting, diarrhea, bright red blood per rectum, melena, or hematemesis Neurologic: negative for visual changes, syncope, or dizziness All other systems reviewed and are otherwise negative except as noted above.    Blood pressure (!) 160/82, pulse 69, height 6' (1.829 m), weight 161 lb (73 kg), SpO2 98%.  General appearance: alert and no distress Neck: no adenopathy, no JVD, supple, symmetrical, trachea midline, thyroid  not enlarged, symmetric, no tenderness/mass/nodules, and soft high pitched right carotid bruit. Lungs: clear to auscultation bilaterally Heart: irregularly irregular rhythm Extremities: extremities normal, atraumatic, no cyanosis or edema Pulses: 2+ and symmetric Skin: Skin color, texture, turgor normal. No rashes or lesions Neurologic: Grossly normal  EKG EKG Interpretation Date/Time:  Monday Jun 15 2023 14:31:38 EDT Ventricular Rate:  69 PR  Interval:    QRS Duration:  78 QT Interval:  382 QTC Calculation: 409 R Axis:   -75  Text Interpretation: Atrial fibrillation with premature ventricular or aberrantly conducted complexes Left axis deviation Septal infarct (cited on or before 16-Oct-2006) When compared with ECG of 16-Feb-2014 10:41, Atrial fibrillation has replaced Sinus rhythm Questionable change in initial forces of Anteroseptal leads Confirmed by Lauro Portal [phone]) on 06/15/2023 2:46:45 PM    ASSESSMENT AND PLAN:   Hyperlipidemia associated with type 2 diabetes mellitus (HCC) History of hyperlipidemia on statin therapy with lipid profile performed 06/07/23 revealing total cholesterol 145, LDL 73 and HDL 62.  Benign essential HTN History of essential hypertension with blood pressure measured today at 160/82.  He states his blood pressure usually is much lower than this.  He is on diltiazem .  Chronic atrial fibrillation (HCC) History of chronic A-fib rate controlled on warfarin oral anticoagulation.  They checked her own INRs at  home.  Pure hypercholesterolemia History of hyperlipidemia on atorvastatin  with lipid profile performed 06/09/2023 revealing total cholesterol 145, LDL 73 and HDL 62.     Avanell Leigh MD [facility], [facility] 06/15/2023 2:56 PM

## 2023-06-15 NOTE — Assessment & Plan Note (Signed)
 History of hyperlipidemia on statin therapy with lipid profile performed 06/07/23 revealing total cholesterol 145, LDL 73 and HDL 62.

## 2023-06-15 NOTE — Patient Instructions (Signed)

## 2023-06-15 NOTE — Assessment & Plan Note (Signed)
 History of hyperlipidemia on atorvastatin  with lipid profile performed 06/09/2023 revealing total cholesterol 145, LDL 73 and HDL 62.

## 2023-06-15 NOTE — Assessment & Plan Note (Signed)
 History of essential hypertension with blood pressure measured today at 160/82.  He states his blood pressure usually is much lower than this.  He is on diltiazem .

## 2023-06-15 NOTE — Assessment & Plan Note (Signed)
 History of chronic A-fib rate controlled on warfarin oral anticoagulation.  They checked her own INRs at home.

## 2023-06-16 ENCOUNTER — Ambulatory Visit: Payer: Self-pay

## 2023-06-17 ENCOUNTER — Telehealth (INDEPENDENT_AMBULATORY_CARE_PROVIDER_SITE_OTHER): Payer: Self-pay | Admitting: *Deleted

## 2023-06-17 DIAGNOSIS — Z7901 Long term (current) use of anticoagulants: Secondary | ICD-10-CM

## 2023-06-17 DIAGNOSIS — D6859 Other primary thrombophilia: Secondary | ICD-10-CM

## 2023-06-17 LAB — POCT INR: INR: 2.4 (ref 2.0–3.0)

## 2023-06-17 NOTE — Telephone Encounter (Signed)
 Fax received [facility] PT/INR self testing service Test date/time 06/17/2023 8:40 AM INR 2.4

## 2023-06-17 NOTE — Telephone Encounter (Signed)
Continue coumadin as is °

## 2023-06-17 NOTE — Telephone Encounter (Signed)
 lmtcb

## 2023-06-18 DIAGNOSIS — Z85828 Personal history of other malignant neoplasm of skin: Secondary | ICD-10-CM | POA: Diagnosis not present

## 2023-06-18 DIAGNOSIS — Z85820 Personal history of malignant melanoma of skin: Secondary | ICD-10-CM | POA: Diagnosis not present

## 2023-06-18 DIAGNOSIS — Z08 Encounter for follow-up examination after completed treatment for malignant neoplasm: Secondary | ICD-10-CM | POA: Diagnosis not present

## 2023-06-18 NOTE — Telephone Encounter (Signed)
 Lmtcb

## 2023-06-18 NOTE — Telephone Encounter (Signed)
Contacted patient. Patient aware ?

## 2023-06-24 ENCOUNTER — Telehealth: Payer: Self-pay | Admitting: *Deleted

## 2023-06-24 ENCOUNTER — Ambulatory Visit (INDEPENDENT_AMBULATORY_CARE_PROVIDER_SITE_OTHER): Admitting: Family Medicine

## 2023-06-24 DIAGNOSIS — I4821 Permanent atrial fibrillation: Secondary | ICD-10-CM | POA: Diagnosis not present

## 2023-06-24 DIAGNOSIS — Z86718 Personal history of other venous thrombosis and embolism: Secondary | ICD-10-CM

## 2023-06-24 DIAGNOSIS — Z7901 Long term (current) use of anticoagulants: Secondary | ICD-10-CM | POA: Diagnosis not present

## 2023-06-24 DIAGNOSIS — D6859 Other primary thrombophilia: Secondary | ICD-10-CM

## 2023-06-24 DIAGNOSIS — I482 Chronic atrial fibrillation, unspecified: Secondary | ICD-10-CM | POA: Diagnosis not present

## 2023-06-24 LAB — POCT INR: INR: 2.7 (ref 2.0–3.0)

## 2023-06-24 NOTE — Progress Notes (Signed)
 INR 2.7, continue current regimen.

## 2023-06-24 NOTE — Telephone Encounter (Signed)
 Fax received [facility] PT/INR self testing service Test date/time 06/24/23 633 am INR 2.7

## 2023-06-24 NOTE — Telephone Encounter (Signed)
 Patient aware and verbalizes understanding.

## 2023-07-01 ENCOUNTER — Ambulatory Visit (INDEPENDENT_AMBULATORY_CARE_PROVIDER_SITE_OTHER): Admitting: Family Medicine

## 2023-07-01 ENCOUNTER — Telehealth: Payer: Self-pay | Admitting: *Deleted

## 2023-07-01 DIAGNOSIS — I482 Chronic atrial fibrillation, unspecified: Secondary | ICD-10-CM | POA: Diagnosis not present

## 2023-07-01 DIAGNOSIS — D6859 Other primary thrombophilia: Secondary | ICD-10-CM

## 2023-07-01 DIAGNOSIS — Z86718 Personal history of other venous thrombosis and embolism: Secondary | ICD-10-CM

## 2023-07-01 LAB — POCT INR: INR: 2.2 (ref 2.0–3.0)

## 2023-07-01 NOTE — Telephone Encounter (Signed)
 Patient aware and verbalizes understanding.

## 2023-07-01 NOTE — Telephone Encounter (Signed)
 Fax received [facility] PT/INR self testing service Test date/time 07/01/23 915 am INR 2.2

## 2023-07-01 NOTE — Progress Notes (Signed)
 INR 2.2, at goal, continue current regimen.

## 2023-07-08 ENCOUNTER — Ambulatory Visit (INDEPENDENT_AMBULATORY_CARE_PROVIDER_SITE_OTHER): Admitting: Family Medicine

## 2023-07-08 ENCOUNTER — Telehealth: Payer: Self-pay | Admitting: *Deleted

## 2023-07-08 DIAGNOSIS — Z86718 Personal history of other venous thrombosis and embolism: Secondary | ICD-10-CM

## 2023-07-08 DIAGNOSIS — I482 Chronic atrial fibrillation, unspecified: Secondary | ICD-10-CM

## 2023-07-08 DIAGNOSIS — D6859 Other primary thrombophilia: Secondary | ICD-10-CM

## 2023-07-08 LAB — POCT INR: INR: 2.3 (ref 2.0–3.0)

## 2023-07-08 NOTE — Telephone Encounter (Signed)
 Fax received [facility] PT/INR self testing service Test date/time 07/08/23 737 am INR 2.3

## 2023-07-08 NOTE — Progress Notes (Signed)
 INR 2.3, at goal, continue current regimen.

## 2023-07-08 NOTE — Telephone Encounter (Signed)
 lmtcb

## 2023-07-15 ENCOUNTER — Telehealth: Payer: Self-pay | Admitting: *Deleted

## 2023-07-15 ENCOUNTER — Ambulatory Visit (INDEPENDENT_AMBULATORY_CARE_PROVIDER_SITE_OTHER): Admitting: Family Medicine

## 2023-07-15 DIAGNOSIS — D6859 Other primary thrombophilia: Secondary | ICD-10-CM

## 2023-07-15 DIAGNOSIS — I482 Chronic atrial fibrillation, unspecified: Secondary | ICD-10-CM | POA: Diagnosis not present

## 2023-07-15 DIAGNOSIS — Z86718 Personal history of other venous thrombosis and embolism: Secondary | ICD-10-CM

## 2023-07-15 LAB — POCT INR: INR: 2.1 (ref 2.0–3.0)

## 2023-07-15 NOTE — Telephone Encounter (Signed)
 Fax received [facility] PT/INR self testing service Test date/time 07/15/23 823 am INR 2.1

## 2023-07-15 NOTE — Telephone Encounter (Signed)
 Patient aware and verbalizes understanding.

## 2023-07-15 NOTE — Progress Notes (Signed)
 INR 2.1, at goal, continue current regimen.

## 2023-07-22 ENCOUNTER — Telehealth: Payer: Self-pay | Admitting: *Deleted

## 2023-07-22 ENCOUNTER — Ambulatory Visit (INDEPENDENT_AMBULATORY_CARE_PROVIDER_SITE_OTHER): Admitting: Family Medicine

## 2023-07-22 DIAGNOSIS — I482 Chronic atrial fibrillation, unspecified: Secondary | ICD-10-CM

## 2023-07-22 DIAGNOSIS — Z86718 Personal history of other venous thrombosis and embolism: Secondary | ICD-10-CM

## 2023-07-22 DIAGNOSIS — D6859 Other primary thrombophilia: Secondary | ICD-10-CM

## 2023-07-22 DIAGNOSIS — Z7901 Long term (current) use of anticoagulants: Secondary | ICD-10-CM | POA: Diagnosis not present

## 2023-07-22 DIAGNOSIS — I4821 Permanent atrial fibrillation: Secondary | ICD-10-CM | POA: Diagnosis not present

## 2023-07-22 LAB — POCT INR: INR: 2.7 (ref 2.0–3.0)

## 2023-07-22 NOTE — Telephone Encounter (Signed)
 Fax received [facility] PT/INR self testing service Test date/time 07/22/23 806am INR 2.7

## 2023-07-22 NOTE — Telephone Encounter (Signed)
 Patient aware and verbalizes understanding.

## 2023-07-22 NOTE — Progress Notes (Signed)
 INR 2.7, at goal, continue current regimen.

## 2023-07-27 ENCOUNTER — Other Ambulatory Visit: Payer: Self-pay

## 2023-07-30 ENCOUNTER — Ambulatory Visit: Admitting: Cardiology

## 2023-07-30 ENCOUNTER — Telehealth: Payer: Self-pay

## 2023-07-30 ENCOUNTER — Telehealth: Payer: Self-pay | Admitting: *Deleted

## 2023-07-30 DIAGNOSIS — Z86718 Personal history of other venous thrombosis and embolism: Secondary | ICD-10-CM

## 2023-07-30 DIAGNOSIS — I482 Chronic atrial fibrillation, unspecified: Secondary | ICD-10-CM

## 2023-07-30 DIAGNOSIS — D6859 Other primary thrombophilia: Secondary | ICD-10-CM

## 2023-07-30 NOTE — Telephone Encounter (Signed)
 Copied from CRM [id number]. Topic: General - Other >> Jul 30, 2023  2:33 PM [name] wrote: Reason for CRM: patient calling to inform the office the blood work was good and he is happy  Pt num [phone] (M)

## 2023-07-30 NOTE — Telephone Encounter (Signed)
 lmtcb

## 2023-07-30 NOTE — Telephone Encounter (Signed)
 Fax received [facility] PT/INR self testing service Test date/time 07/30/23 933 am INR 2.7

## 2023-07-30 NOTE — Telephone Encounter (Signed)
 INR at goal. Continue current regimen.

## 2023-07-31 DIAGNOSIS — E162 Hypoglycemia, unspecified: Secondary | ICD-10-CM | POA: Diagnosis not present

## 2023-07-31 DIAGNOSIS — I10 Essential (primary) hypertension: Secondary | ICD-10-CM | POA: Diagnosis not present

## 2023-07-31 DIAGNOSIS — E7800 Pure hypercholesterolemia, unspecified: Secondary | ICD-10-CM | POA: Diagnosis not present

## 2023-07-31 DIAGNOSIS — G629 Polyneuropathy, unspecified: Secondary | ICD-10-CM | POA: Diagnosis not present

## 2023-07-31 DIAGNOSIS — E1065 Type 1 diabetes mellitus with hyperglycemia: Secondary | ICD-10-CM | POA: Diagnosis not present

## 2023-08-02 ENCOUNTER — Emergency Department (HOSPITAL_BASED_OUTPATIENT_CLINIC_OR_DEPARTMENT_OTHER)
Admission: EM | Admit: 2023-08-02 | Discharge: 2023-08-02 | Disposition: A | Discharge status: Home / Self Care | Attending: Emergency Medicine | Admitting: Emergency Medicine

## 2023-08-02 ENCOUNTER — Other Ambulatory Visit: Payer: Self-pay

## 2023-08-02 ENCOUNTER — Encounter (HOSPITAL_BASED_OUTPATIENT_CLINIC_OR_DEPARTMENT_OTHER): Payer: Self-pay | Admitting: Emergency Medicine

## 2023-08-02 DIAGNOSIS — Z7901 Long term (current) use of anticoagulants: Secondary | ICD-10-CM | POA: Diagnosis not present

## 2023-08-02 DIAGNOSIS — S81811A Laceration without foreign body, right lower leg, initial encounter: Secondary | ICD-10-CM | POA: Diagnosis not present

## 2023-08-02 DIAGNOSIS — I83891 Varicose veins of right lower extremities with other complications: Secondary | ICD-10-CM | POA: Insufficient documentation

## 2023-08-02 DIAGNOSIS — Z23 Encounter for immunization: Secondary | ICD-10-CM | POA: Diagnosis not present

## 2023-08-02 DIAGNOSIS — I83899 Varicose veins of unspecified lower extremities with other complications: Secondary | ICD-10-CM

## 2023-08-02 MED ORDER — TETANUS-DIPHTH-ACELL PERTUSSIS 5-2.5-18.5 LF-MCG/0.5 IM SUSY
0.5000 mL | PREFILLED_SYRINGE | Freq: Once | INTRAMUSCULAR | Status: AC
  Administered 2023-08-02: 0.5 mL via INTRAMUSCULAR
  Filled 2023-08-02: qty 0.5

## 2023-08-02 MED ORDER — LIDOCAINE-EPINEPHRINE (PF) 2 %-1:200000 IJ SOLN
10.0000 mL | Freq: Once | INTRAMUSCULAR | Status: AC
  Administered 2023-08-02: 10 mL
  Filled 2023-08-02: qty 20

## 2023-08-02 NOTE — Discharge Instructions (Signed)
 It was a pleasure taking care of you here today.  You had 1 suture placed over the bleeding of the varicose vein as well as a pressure dressing  Leave on for the next 24 hours.  If starts to bleed hold pressure.  Suture removed in 1 week  We discussed checking your INR here which you declined.  Make sure you check at home.  Follow-up outpatient, return for any worsening symptoms

## 2023-08-02 NOTE — ED Provider Notes (Signed)
 [place] EMERGENCY DEPARTMENT AT [facility] Provider Note   CSN: [id number] Arrival date & time: 08/02/23  2054     Patient presents with: Laceration   Christopher Avery is a 73 y.o. male here for evaluation of injury to right lower extremity.  He is on Coumadin  for A-fib.  Last INR check 2.7 on Wednesday.  His tetanus 2016.  States he bumped his leg on bicycle equipment earlier today.  Initially wife placed a pressure dressing which resolved the bleeding.  He remove the dressing earlier today to take a shower when he began to have persistent bleeding.  Wife could not get this to stop bleeding here to the emergency department.  No lightheadedness or dizziness.  He is ambulatory here.  No bony pain.   HPI     Prior to Admission medications   Medication Sig Start Date End Date Taking? Authorizing Provider  atorvastatin  (LIPITOR) 20 MG tablet Take 1 tablet (20 mg total) by mouth daily. 06/09/23   Severa Rock HERO, FNP  cetirizine  (ZYRTEC  ALLERGY) 10 MG tablet Take 1 tablet (10 mg total) by mouth at bedtime. 12/05/22   Severa Rock HERO, FNP  Cholecalciferol (VITAMIN D3) 125 MCG (5000 UT) CAPS Take 1 capsule by mouth daily. Takes 6 days a week    [provider]  co-enzyme Q-10 30 MG capsule Take 100 mg by mouth daily.    [provider]  Continuous Blood Gluc Sensor (FREESTYLE LIBRE 2 SENSOR) MISC by Does not apply route.    [provider]  Continuous Glucose Sensor (GUARDIAN 4 GLUCOSE SENSOR) MISC by Does not apply route.    [provider]  diltiazem  (CARDIZEM  CD) 240 MG 24 hr capsule TAKE 1 CAPSULE BY MOUTH EVERY DAY 06/05/23   Court Dorn PARAS, MD  fluticasone  (FLONASE ) 50 MCG/ACT nasal spray Place 2 sprays into both nostrils daily. 12/05/22   Severa Rock HERO, FNP  Insulin  Glargine (BASAGLAR  KWIKPEN) 100 UNIT/ML Inject 19 Units into the skin daily. 07/04/21   Severa Rock HERO, FNP  insulin  lispro (HUMALOG  KWIKPEN) 200 UNIT/ML KwikPen Inject 16 Units into  the skin 3 (three) times daily with meals. Patient not taking: Reported on 06/15/2023 07/18/21   Severa Rock HERO, FNP  Insulin  Pen Needle (BD PEN NEEDLE NANO 2ND GEN) 32G X 4 MM MISC USE WITH INSULIN  4 TIMES A DAY DX E11.9 02/04/22   Dettinger, Fonda LABOR, MD  NOVOLOG  FLEXPEN 100 UNIT/ML FlexPen     [provider]  sildenafil  (VIAGRA ) 100 MG tablet Take 0.5-1 tablets (50-100 mg total) by mouth daily as needed for erectile dysfunction. 12/05/22   Severa Rock HERO, FNP  valACYclovir  (VALTREX ) 500 MG tablet TAKE 4 TABLETS BY MOUTH TWICE DAILY AS DIRECTED AND AS NEEDED 08/25/18   Jolinda Potter M, DO  warfarin (COUMADIN ) 2.5 MG tablet Take 1 tablet (2.5 mg total) by mouth one time only at 4 PM for 1 dose. 06/09/23 06/10/23  Severa Rock HERO, FNP    Allergies: Januvia [sitagliptin]    Review of Systems  Musculoskeletal: Negative.   Skin:  Positive for wound.  Neurological: Negative.   All other systems reviewed and are negative.   Updated Vital Signs BP (!) 159/91   Pulse 76   Temp 97.9 F (36.6 C) (Oral)   Resp 18   SpO2 99%   Physical Exam Vitals and nursing note reviewed.  Constitutional:      General: He is not in acute distress.    Appearance:  He is well-developed. He is not ill-appearing, toxic-appearing or diaphoretic.  HENT:     Head: Atraumatic.   Eyes:     Pupils: Pupils are equal, round, and reactive to light.    Cardiovascular:     Rate and Rhythm: Normal rate and regular rhythm.     Pulses:          Dorsalis pedis pulses are 2+ on the right side and 2+ on the left side.  Pulmonary:     Effort: Pulmonary effort is normal. No respiratory distress.  Abdominal:     General: There is no distension.     Palpations: Abdomen is soft.   Musculoskeletal:        General: Normal range of motion.     Cervical back: Normal range of motion and neck supple.     Comments: No bony tenderness, full range of motion, compartment soft.   Skin:    General: Skin is warm and dry.      Comments: Persistent nonpulsatile bleeding to right lower extremity just proximal to the lateral malleolus.   Neurological:     General: No focal deficit present.     Mental Status: He is alert and oriented to person, place, and time.     (all labs ordered are listed, but only abnormal results are displayed) Labs Reviewed - No data to display   EKG: None  Radiology: No results found.   .Laceration Repair  Date/Time: 08/02/2023 9:45 PM  Performed by: Edie Einstein A, PA-C Authorized by: Edie Einstein LABOR, PA-C   Consent:    Consent obtained:  Verbal   Consent given by:  Patient   Risks, benefits, and alternatives were discussed: yes     Risks discussed:  Infection, pain, retained foreign body, tendon damage, vascular damage, poor wound healing, poor cosmetic result, need for additional repair and nerve damage   Alternatives discussed:  No treatment, delayed treatment, observation and referral Universal protocol:    Procedure explained and questions answered to patient or proxy's satisfaction: yes     Relevant documents present and verified: yes     Test results available: yes     Imaging studies available: yes     Required blood products, implants, devices, and special equipment available: yes     Site/side marked: yes     Immediately prior to procedure, a time out was called: yes     Patient identity confirmed:  Verbally with patient Anesthesia:    Anesthesia method:  Local infiltration   Local anesthetic:  Lidocaine  1% WITH epi Laceration details:    Location:  Leg   Leg location:  R lower leg   Length (cm):  0.2   Depth (mm):  2 Pre-procedure details:    Preparation:  Patient was prepped and draped in usual sterile fashion Exploration:    Hemostasis achieved with:  Tied off vessels   Imaging outcome: foreign body not noted     Wound extent: vascular damage     Wound extent: no foreign body, no signs of injury, no nerve damage, no tendon damage and no  underlying fracture     Contaminated: no   Treatment:    Area cleansed with:  Povidone-iodine   Amount of cleaning:  Extensive   Irrigation solution:  Sterile saline Skin repair:    Repair method:  Sutures   Suture size:  4-0   Suture material:  Prolene   Suture technique:  Figure eight   Number of sutures:  1  Approximation:    Approximation:  Close Repair type:    Repair type:  Simple Post-procedure details:    Dressing:  Bulky dressing   Procedure completion:  Tolerated well, no immediate complications Comments:     Bleeding varicose vein    Medications Ordered in the ED  lidocaine -EPINEPHrine  (XYLOCAINE  W/EPI) 2 %-1:200000 (PF) injection 10 mL (has no administration in time range)  Tdap (BOOSTRIX ) injection 0.5 mL (has no administration in time range)    Clinical Course as of 08/02/23 2148  Sun Aug 02, 2023  2127 On Coumadin  for A-fib, varicose vein right lower leg bleeding [BH]    Clinical Course User Index [BH] Somaly Marteney A, PA-C   73 year old on chronic anticoagulation for A-fib here for evaluation of wound to right lower leg.  Earlier today brushed up against a bike rack.  Noted some bleeding which initially stopped with pressure at home, reoccurred today.  Here he has a persistent bleeding varicose vein.  Will update his tetanus.  Do not feel needs imaging.   See procedure note.  Patient tolerated well.  1 figure-of-eight suture placed with cessation of bleeding.  Wound wrapped.  Discussed checking INR here however patient and family declined.  He had this checked 2 days ago was 2.7.  Wife states she will check at home as she has the machine.  I feel this is reasonable.  No lightheadedness, dizziness to suggest significant hemoglobin drop requesting CBC check  Will have follow-up outpatient, return for new worsening symptoms.  The patient has been appropriately medically screened and/or stabilized in the ED. I have low suspicion for any other emergent medical  condition which would require further screening, evaluation or treatment in the ED or require inpatient management.  Patient is hemodynamically stable and in no acute distress.  Patient able to ambulate in department prior to ED.  Evaluation does not show acute pathology that would require ongoing or additional emergent interventions while in the emergency department or further inpatient treatment.  I have discussed the diagnosis with the patient and answered all questions.  Pain is been managed while in the emergency department and patient has no further complaints prior to discharge.  Patient is comfortable with plan discussed in room and is stable for discharge at this time.  I have discussed strict return precautions for returning to the emergency department.  Patient was encouraged to follow-up with PCP/specialist refer to at discharge.                                 Medical Decision Making Amount and/or Complexity of Data Reviewed Independent Historian: spouse External Data Reviewed: labs, radiology and notes. Labs: ordered.  Risk Prescription drug management. Decision regarding hospitalization. Diagnosis or treatment significantly limited by social determinants of health.        Final diagnoses:  Bleeding from varicose vein  Chronic anticoagulation    ED Discharge Orders     None          Wynnie Pacetti A, PA-C 08/02/23 2148    Patsey Lot, MD 08/02/23 2316

## 2023-08-02 NOTE — ED Notes (Signed)
Reviewed discharge instructions and home care with pt. Pt verbalized understanding and had no further questions. Pt exited ED without complications.

## 2023-08-02 NOTE — ED Triage Notes (Addendum)
 Laceration to right lower leg/ ankle Hit leg on bike rack this afternoon On warfarin, INR 2.7 on Wednesday  Last tetanus 07/12/2014

## 2023-08-12 ENCOUNTER — Ambulatory Visit (INDEPENDENT_AMBULATORY_CARE_PROVIDER_SITE_OTHER): Admitting: Family Medicine

## 2023-08-12 ENCOUNTER — Telehealth: Payer: Self-pay | Admitting: *Deleted

## 2023-08-12 DIAGNOSIS — I482 Chronic atrial fibrillation, unspecified: Secondary | ICD-10-CM

## 2023-08-12 DIAGNOSIS — Z86718 Personal history of other venous thrombosis and embolism: Secondary | ICD-10-CM

## 2023-08-12 DIAGNOSIS — D6859 Other primary thrombophilia: Secondary | ICD-10-CM

## 2023-08-12 LAB — POCT INR: INR: 2 (ref 2.0–3.0)

## 2023-08-12 NOTE — Progress Notes (Signed)
INR 2.0, at goal, continue current regimen.

## 2023-08-12 NOTE — Telephone Encounter (Signed)
 Fax received [facility] PT/INR self testing service Test date/time 08/12/23 739 am INR 2.0

## 2023-08-12 NOTE — Telephone Encounter (Signed)
 Patient aware and verbalizes understanding.

## 2023-08-17 NOTE — Telephone Encounter (Signed)
 Patient is aware of these results. Has been seen since for another check.

## 2023-08-20 ENCOUNTER — Ambulatory Visit (INDEPENDENT_AMBULATORY_CARE_PROVIDER_SITE_OTHER): Admitting: Family Medicine

## 2023-08-20 ENCOUNTER — Telehealth: Payer: Self-pay | Admitting: *Deleted

## 2023-08-20 DIAGNOSIS — I482 Chronic atrial fibrillation, unspecified: Secondary | ICD-10-CM

## 2023-08-20 DIAGNOSIS — D6859 Other primary thrombophilia: Secondary | ICD-10-CM

## 2023-08-20 DIAGNOSIS — Z86718 Personal history of other venous thrombosis and embolism: Secondary | ICD-10-CM

## 2023-08-20 LAB — POCT INR: INR: 1.9 — AB (ref 2.0–3.0)

## 2023-08-20 NOTE — Telephone Encounter (Signed)
 Fax received [facility] PT/INR self testing service Test date/time 08/20/23 1240 pm INR 1.9

## 2023-08-20 NOTE — Telephone Encounter (Signed)
 Left detailed message per signed DPR. Encouraged call back or send us  a Mychart message if there are any questions.

## 2023-08-20 NOTE — Progress Notes (Signed)
 INR 1.9, slightly below goal, continue current regimen.

## 2023-08-26 ENCOUNTER — Telehealth: Payer: Self-pay

## 2023-08-26 ENCOUNTER — Telehealth: Payer: Self-pay | Admitting: *Deleted

## 2023-08-26 ENCOUNTER — Ambulatory Visit (INDEPENDENT_AMBULATORY_CARE_PROVIDER_SITE_OTHER): Payer: Self-pay | Admitting: Family Medicine

## 2023-08-26 DIAGNOSIS — Z86718 Personal history of other venous thrombosis and embolism: Secondary | ICD-10-CM

## 2023-08-26 DIAGNOSIS — I482 Chronic atrial fibrillation, unspecified: Secondary | ICD-10-CM

## 2023-08-26 DIAGNOSIS — D6859 Other primary thrombophilia: Secondary | ICD-10-CM

## 2023-08-26 DIAGNOSIS — I4821 Permanent atrial fibrillation: Secondary | ICD-10-CM | POA: Diagnosis not present

## 2023-08-26 DIAGNOSIS — Z7901 Long term (current) use of anticoagulants: Secondary | ICD-10-CM | POA: Diagnosis not present

## 2023-08-26 LAB — POCT INR: INR: 1.1 — AB (ref 2.0–3.0)

## 2023-08-26 NOTE — Progress Notes (Signed)
 INR 1.1. Currently holding medications as he is having surgery today. Repeat INR in one week.

## 2023-08-26 NOTE — Telephone Encounter (Signed)
Wife aware and verbalizes understanding per dpr.  °

## 2023-08-26 NOTE — Telephone Encounter (Signed)
 Fax received [facility] PT/INR self testing service Test date/time 08/26/23 1004 INR 1.1

## 2023-08-26 NOTE — Telephone Encounter (Unsigned)
 Copied from CRM [id number]. Topic: General - Other >> Aug 26, 2023  3:38 PM Edsel HERO wrote: Christopher Avery with [facility] called to report patients INR taken 7/23 was 1.1. Please advise.

## 2023-08-26 NOTE — Telephone Encounter (Signed)
 Already taken care of

## 2023-09-01 ENCOUNTER — Ambulatory Visit: Payer: Worker's Compensation | Attending: Physician Assistant

## 2023-09-01 DIAGNOSIS — M25561 Pain in right knee: Secondary | ICD-10-CM | POA: Diagnosis present

## 2023-09-01 DIAGNOSIS — R600 Localized edema: Secondary | ICD-10-CM | POA: Diagnosis present

## 2023-09-01 DIAGNOSIS — M25661 Stiffness of right knee, not elsewhere classified: Secondary | ICD-10-CM | POA: Diagnosis present
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
## 2023-09-02 ENCOUNTER — Telehealth: Payer: Self-pay | Admitting: *Deleted

## 2023-09-02 ENCOUNTER — Ambulatory Visit (INDEPENDENT_AMBULATORY_CARE_PROVIDER_SITE_OTHER): Admitting: Family Medicine

## 2023-09-02 DIAGNOSIS — I482 Chronic atrial fibrillation, unspecified: Secondary | ICD-10-CM

## 2023-09-02 DIAGNOSIS — Z86718 Personal history of other venous thrombosis and embolism: Secondary | ICD-10-CM

## 2023-09-02 DIAGNOSIS — D6859 Other primary thrombophilia: Secondary | ICD-10-CM

## 2023-09-02 LAB — POCT INR: INR: 2.8 (ref 2.0–3.0)

## 2023-09-02 NOTE — Telephone Encounter (Signed)
 Lmtcb When patient calls back review results

## 2023-09-02 NOTE — Progress Notes (Signed)
 INR 2.8, at goal since resuming medications. Continue current regimen.

## 2023-09-02 NOTE — Telephone Encounter (Signed)
 Fax received mdINR PT/INR self testing service Test date/time 09/02/23 750 am INR 2.8

## 2023-09-03 ENCOUNTER — Encounter: Payer: Self-pay | Admitting: Physical Therapy

## 2023-09-03 ENCOUNTER — Ambulatory Visit: Payer: Worker's Compensation | Admitting: Physical Therapy

## 2023-09-03 DIAGNOSIS — M25561 Pain in right knee: Secondary | ICD-10-CM | POA: Diagnosis not present

## 2023-09-03 DIAGNOSIS — R600 Localized edema: Secondary | ICD-10-CM

## 2023-09-03 DIAGNOSIS — M25661 Stiffness of right knee, not elsewhere classified: Secondary | ICD-10-CM
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
## 2023-09-04 NOTE — Telephone Encounter (Signed)
 Patient aware.

## 2023-09-06 ENCOUNTER — Other Ambulatory Visit: Payer: Self-pay | Admitting: Cardiovascular Disease

## 2023-09-07 ENCOUNTER — Ambulatory Visit: Payer: Worker's Compensation | Attending: Physician Assistant

## 2023-09-07 ENCOUNTER — Ambulatory Visit: Payer: Worker's Compensation

## 2023-09-07 DIAGNOSIS — M25661 Stiffness of right knee, not elsewhere classified: Secondary | ICD-10-CM | POA: Insufficient documentation

## 2023-09-07 DIAGNOSIS — M25561 Pain in right knee: Secondary | ICD-10-CM | POA: Insufficient documentation

## 2023-09-07 DIAGNOSIS — R600 Localized edema: Secondary | ICD-10-CM | POA: Insufficient documentation
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
## 2023-09-09 ENCOUNTER — Telehealth: Payer: Self-pay | Admitting: *Deleted

## 2023-09-09 ENCOUNTER — Ambulatory Visit (INDEPENDENT_AMBULATORY_CARE_PROVIDER_SITE_OTHER): Admitting: Family Medicine

## 2023-09-09 DIAGNOSIS — Z86718 Personal history of other venous thrombosis and embolism: Secondary | ICD-10-CM

## 2023-09-09 DIAGNOSIS — I482 Chronic atrial fibrillation, unspecified: Secondary | ICD-10-CM | POA: Diagnosis not present

## 2023-09-09 DIAGNOSIS — D6859 Other primary thrombophilia: Secondary | ICD-10-CM

## 2023-09-09 LAB — POCT INR: INR: 3.3 — AB (ref 2.0–3.0)

## 2023-09-09 NOTE — Telephone Encounter (Signed)
 Fax received [facility]INR PT/INR self testing service Test date/time 09/09/23 906 am INR 3.3

## 2023-09-09 NOTE — Telephone Encounter (Signed)
 Patient aware and verbalizes understanding.

## 2023-09-09 NOTE — Progress Notes (Signed)
 INR 3.3, slightly above goal. Need to take 5 mg tonight instead of 7.5 mg, then continue current regimen.

## 2023-09-10 ENCOUNTER — Ambulatory Visit

## 2023-09-10 ENCOUNTER — Ambulatory Visit: Payer: Worker's Compensation

## 2023-09-10 VITALS — BP 160/82 | HR 69 | Ht 73.0 in | Wt 161.0 lb

## 2023-09-10 DIAGNOSIS — M25561 Pain in right knee: Secondary | ICD-10-CM

## 2023-09-10 DIAGNOSIS — R600 Localized edema: Secondary | ICD-10-CM

## 2023-09-10 DIAGNOSIS — Z0000 Encounter for general adult medical examination without abnormal findings: Secondary | ICD-10-CM

## 2023-09-10 DIAGNOSIS — M25661 Stiffness of right knee, not elsewhere classified: Secondary | ICD-10-CM
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
## 2023-09-10 NOTE — Patient Instructions (Addendum)
 Mr. Kunesh , Thank you for taking time out of your busy schedule to complete your Annual Wellness Visit with me. I enjoyed our conversation and look forward to speaking with you again next year. I, as well as your care team,  appreciate your ongoing commitment to your health goals. Please review the following plan we discussed and let me know if I can assist you in the future. Your Game plan/ To Do List    Referrals: If you haven't heard from the office you've been referred to, please reach out to them at the phone provided.   Follow up Visits: We will see or speak with you next year for your Next Medicare AWV with our clinical staff on 09/12/24 at 3:50p.m. Have you seen your provider in the last 6 months (3 months if uncontrolled diabetes)? Yes  Clinician Recommendations:  Aim for 30 minutes of exercise or brisk walking, 6-8 glasses of water, and 5 servings of fruits and vegetables each day.       This is a list of the screenings recommended for you:  Health Maintenance  Topic Date Due   Medicare Annual Wellness Visit  05/19/2023   Flu Shot  09/04/2023   Pneumococcal Vaccine for age over 59 (3 of 3 - PCV20 or PCV21) 12/05/2023*   Yearly kidney function blood test for diabetes  06/08/2024   Yearly kidney health urinalysis for diabetes  06/08/2024   Colon Cancer Screening  08/18/2029   DTaP/Tdap/Td vaccine (4 - Td or Tdap) 08/01/2033   Hepatitis C Screening  Completed   Zoster (Shingles) Vaccine  Completed   Hepatitis B Vaccine  Aged Out   HPV Vaccine  Aged Out   Meningitis B Vaccine  Aged Out   Complete foot exam   Discontinued   Hemoglobin A1C  Discontinued   Eye exam for diabetics  Discontinued   COVID-19 Vaccine  Discontinued  *Topic was postponed. The date shown is not the original due date.    Advanced directives: (Copy Requested) Please bring a copy of your health care power of attorney and living will to the office to be added to your chart at your convenience. You can mail  to [facility] [address] [place], [place] [address] or email to [email or web address] Advance Care Planning is important because it:  [x]  Makes sure you receive the medical care that is consistent with your values, goals, and preferences  [x]  It provides guidance to your family and loved ones and reduces their decisional burden about whether or not they are making the right decisions based on your wishes.  Follow the link provided in your after visit summary or read over the paperwork we have mailed to you to help you started getting your Advance Directives in place. If you need assistance in completing these, please reach out to us  so that we can help you!  See attachments for Preventive Care and Fall Prevention Tips.
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
## 2023-09-14 ENCOUNTER — Ambulatory Visit: Payer: Worker's Compensation | Admitting: *Deleted

## 2023-09-14 ENCOUNTER — Encounter: Payer: Self-pay | Admitting: *Deleted

## 2023-09-14 DIAGNOSIS — M25561 Pain in right knee: Secondary | ICD-10-CM

## 2023-09-14 DIAGNOSIS — R600 Localized edema: Secondary | ICD-10-CM

## 2023-09-14 DIAGNOSIS — M25661 Stiffness of right knee, not elsewhere classified: Secondary | ICD-10-CM
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
## 2023-09-17 ENCOUNTER — Ambulatory Visit: Payer: Worker's Compensation

## 2023-09-17 ENCOUNTER — Telehealth: Payer: Self-pay | Admitting: *Deleted

## 2023-09-17 ENCOUNTER — Ambulatory Visit (INDEPENDENT_AMBULATORY_CARE_PROVIDER_SITE_OTHER): Admitting: Family Medicine

## 2023-09-17 DIAGNOSIS — I482 Chronic atrial fibrillation, unspecified: Secondary | ICD-10-CM | POA: Diagnosis not present

## 2023-09-17 DIAGNOSIS — M25561 Pain in right knee: Secondary | ICD-10-CM

## 2023-09-17 DIAGNOSIS — M25661 Stiffness of right knee, not elsewhere classified: Secondary | ICD-10-CM

## 2023-09-17 DIAGNOSIS — D6859 Other primary thrombophilia: Secondary | ICD-10-CM

## 2023-09-17 DIAGNOSIS — Z86718 Personal history of other venous thrombosis and embolism: Secondary | ICD-10-CM

## 2023-09-17 LAB — POCT INR: INR: 2.1 (ref 2.0–3.0)

## 2023-09-17 NOTE — Telephone Encounter (Signed)
 Patient aware and verbalizes understanding.

## 2023-09-17 NOTE — Progress Notes (Signed)
 INR 2.1, at goal, continue current regimen.

## 2023-09-17 NOTE — Telephone Encounter (Signed)
 Fax received [facility] PT/INR self testing service Test date/time 09/16/23 513 pm INR 2.1
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
## 2023-09-21 ENCOUNTER — Ambulatory Visit: Payer: Worker's Compensation

## 2023-09-21 DIAGNOSIS — M25561 Pain in right knee: Secondary | ICD-10-CM

## 2023-09-21 DIAGNOSIS — M25661 Stiffness of right knee, not elsewhere classified: Secondary | ICD-10-CM
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
## 2023-09-23 ENCOUNTER — Ambulatory Visit

## 2023-09-23 ENCOUNTER — Ambulatory Visit (INDEPENDENT_AMBULATORY_CARE_PROVIDER_SITE_OTHER): Payer: Self-pay | Admitting: Family Medicine

## 2023-09-23 ENCOUNTER — Telehealth: Payer: Self-pay | Admitting: *Deleted

## 2023-09-23 DIAGNOSIS — D6859 Other primary thrombophilia: Secondary | ICD-10-CM

## 2023-09-23 DIAGNOSIS — M25661 Stiffness of right knee, not elsewhere classified: Secondary | ICD-10-CM

## 2023-09-23 DIAGNOSIS — Z86718 Personal history of other venous thrombosis and embolism: Secondary | ICD-10-CM

## 2023-09-23 DIAGNOSIS — Z7901 Long term (current) use of anticoagulants: Secondary | ICD-10-CM | POA: Diagnosis not present

## 2023-09-23 DIAGNOSIS — M25561 Pain in right knee: Secondary | ICD-10-CM

## 2023-09-23 DIAGNOSIS — I482 Chronic atrial fibrillation, unspecified: Secondary | ICD-10-CM

## 2023-09-23 DIAGNOSIS — I4821 Permanent atrial fibrillation: Secondary | ICD-10-CM | POA: Diagnosis not present

## 2023-09-23 LAB — POCT INR: INR: 2.7 (ref 2.0–3.0)

## 2023-09-23 NOTE — Telephone Encounter (Signed)
 Fax received [facility]IN[facility] PT/INR self testing service Test date/time 09/23/23 905 am INR 2.7

## 2023-09-23 NOTE — Telephone Encounter (Signed)
 Patient aware and verbalizes understanding.
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
## 2023-09-23 NOTE — Progress Notes (Signed)
INR 2.7 at goal, continue current regimen.

## 2023-09-29 ENCOUNTER — Ambulatory Visit: Payer: Worker's Compensation

## 2023-09-29 DIAGNOSIS — M25661 Stiffness of right knee, not elsewhere classified: Secondary | ICD-10-CM

## 2023-09-29 DIAGNOSIS — M25561 Pain in right knee: Secondary | ICD-10-CM | POA: Diagnosis not present

## 2023-09-29 DIAGNOSIS — R600 Localized edema: Secondary | ICD-10-CM
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
## 2023-09-30 ENCOUNTER — Telehealth (INDEPENDENT_AMBULATORY_CARE_PROVIDER_SITE_OTHER): Payer: Self-pay | Admitting: *Deleted

## 2023-09-30 DIAGNOSIS — D6859 Other primary thrombophilia: Secondary | ICD-10-CM

## 2023-09-30 DIAGNOSIS — Z86718 Personal history of other venous thrombosis and embolism: Secondary | ICD-10-CM

## 2023-09-30 DIAGNOSIS — I482 Chronic atrial fibrillation, unspecified: Secondary | ICD-10-CM

## 2023-09-30 LAB — POCT INR
INR: 3 (ref 2.0–3.0)
INR: 3 (ref 2.0–3.0)

## 2023-09-30 NOTE — Telephone Encounter (Signed)
 Fax received [employer] PT/INR self testing service Test date/time 09/30/2023 12:20 INR 3

## 2023-10-01 ENCOUNTER — Ambulatory Visit (INDEPENDENT_AMBULATORY_CARE_PROVIDER_SITE_OTHER): Payer: Self-pay | Admitting: Family Medicine

## 2023-10-01 DIAGNOSIS — Z86718 Personal history of other venous thrombosis and embolism: Secondary | ICD-10-CM

## 2023-10-01 DIAGNOSIS — I482 Chronic atrial fibrillation, unspecified: Secondary | ICD-10-CM

## 2023-10-01 DIAGNOSIS — D6859 Other primary thrombophilia: Secondary | ICD-10-CM

## 2023-10-01 NOTE — Telephone Encounter (Signed)
 Patient aware and verbalizes understanding.

## 2023-10-01 NOTE — Progress Notes (Signed)
 INR 3.0 at goal, continue current regimen.

## 2023-10-02 ENCOUNTER — Ambulatory Visit: Payer: Worker's Compensation

## 2023-10-02 DIAGNOSIS — R600 Localized edema: Secondary | ICD-10-CM

## 2023-10-02 DIAGNOSIS — M25561 Pain in right knee: Secondary | ICD-10-CM

## 2023-10-02 DIAGNOSIS — M25661 Stiffness of right knee, not elsewhere classified: Secondary | ICD-10-CM
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
## 2023-10-06 ENCOUNTER — Ambulatory Visit: Payer: Worker's Compensation | Attending: Physician Assistant

## 2023-10-06 DIAGNOSIS — M25661 Stiffness of right knee, not elsewhere classified: Secondary | ICD-10-CM | POA: Diagnosis present

## 2023-10-06 DIAGNOSIS — M25561 Pain in right knee: Secondary | ICD-10-CM | POA: Diagnosis present

## 2023-10-06 DIAGNOSIS — R600 Localized edema: Secondary | ICD-10-CM | POA: Diagnosis present
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
## 2023-10-08 ENCOUNTER — Telehealth (INDEPENDENT_AMBULATORY_CARE_PROVIDER_SITE_OTHER): Admitting: *Deleted

## 2023-10-08 DIAGNOSIS — I482 Chronic atrial fibrillation, unspecified: Secondary | ICD-10-CM | POA: Diagnosis not present

## 2023-10-08 DIAGNOSIS — D6859 Other primary thrombophilia: Secondary | ICD-10-CM

## 2023-10-08 DIAGNOSIS — Z86718 Personal history of other venous thrombosis and embolism: Secondary | ICD-10-CM

## 2023-10-08 LAB — POCT INR: INR: 2.6 (ref 2.0–3.0)

## 2023-10-08 NOTE — Telephone Encounter (Signed)
 Fax received [facility] PT/INR self testing service Test date/time 10/07/23 610 pm INR 2.6

## 2023-10-08 NOTE — Telephone Encounter (Signed)
 Indication: chronic afib Goal INR: 2-3 Current regimen: 5 mg (2.5 mg x 2) every Tue, Wed, Sat; 7.5 mg (2.5 mg x 3) every Thu; 2.5 mg (2.5 mg x 1) all other days   Recommendations:  No changes

## 2023-10-12 ENCOUNTER — Encounter: Admitting: *Deleted

## 2023-10-15 ENCOUNTER — Ambulatory Visit: Payer: Worker's Compensation | Admitting: Physical Therapy

## 2023-10-15 DIAGNOSIS — M25661 Stiffness of right knee, not elsewhere classified: Secondary | ICD-10-CM

## 2023-10-15 DIAGNOSIS — M25561 Pain in right knee: Secondary | ICD-10-CM

## 2023-10-15 DIAGNOSIS — R600 Localized edema: Secondary | ICD-10-CM
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
## 2023-10-16 ENCOUNTER — Ambulatory Visit (INDEPENDENT_AMBULATORY_CARE_PROVIDER_SITE_OTHER): Admitting: Family Medicine

## 2023-10-16 ENCOUNTER — Telehealth: Payer: Self-pay | Admitting: *Deleted

## 2023-10-16 DIAGNOSIS — D6859 Other primary thrombophilia: Secondary | ICD-10-CM

## 2023-10-16 DIAGNOSIS — Z86718 Personal history of other venous thrombosis and embolism: Secondary | ICD-10-CM

## 2023-10-16 DIAGNOSIS — I482 Chronic atrial fibrillation, unspecified: Secondary | ICD-10-CM

## 2023-10-16 LAB — POCT INR: INR: 1.7 — AB (ref 2.0–3.0)

## 2023-10-16 NOTE — Telephone Encounter (Signed)
 Fax received [facility] PT/INR self testing service Test date/time 10/16/23 800 am INR 1.7

## 2023-10-16 NOTE — Telephone Encounter (Signed)
 Patient aware and verbalizes understanding.

## 2023-10-16 NOTE — Progress Notes (Signed)
 INR 1.7, not at goal. Can take 5 mg tonight and then continue current regimen.

## 2023-10-21 ENCOUNTER — Telehealth (INDEPENDENT_AMBULATORY_CARE_PROVIDER_SITE_OTHER): Payer: Self-pay

## 2023-10-21 ENCOUNTER — Ambulatory Visit (INDEPENDENT_AMBULATORY_CARE_PROVIDER_SITE_OTHER): Payer: Self-pay

## 2023-10-21 ENCOUNTER — Telehealth: Payer: Self-pay

## 2023-10-21 DIAGNOSIS — Z86718 Personal history of other venous thrombosis and embolism: Secondary | ICD-10-CM

## 2023-10-21 DIAGNOSIS — I482 Chronic atrial fibrillation, unspecified: Secondary | ICD-10-CM | POA: Diagnosis not present

## 2023-10-21 DIAGNOSIS — I4821 Permanent atrial fibrillation: Secondary | ICD-10-CM | POA: Diagnosis not present

## 2023-10-21 DIAGNOSIS — D6859 Other primary thrombophilia: Secondary | ICD-10-CM

## 2023-10-21 DIAGNOSIS — Z7901 Long term (current) use of anticoagulants: Secondary | ICD-10-CM | POA: Diagnosis not present

## 2023-10-21 LAB — POCT INR
INR: 2.4 (ref 2.0–3.0)
INR: 2.4 (ref 2.0–3.0)

## 2023-10-21 NOTE — Progress Notes (Signed)
 INR test result- 2.4 Test date/time- 10/21/23 12:42pm  INR range- 2-3  Covering PCP - please advise

## 2023-10-21 NOTE — Telephone Encounter (Signed)
 Copied from CRM [id number]. Topic: Clinical - Lab/Test Results >> Oct 21, 2023  3:57 PM Jasmin G wrote: Reason for CRM: Pt called regarding recent missed phone call, please call pt back at [phone].

## 2023-10-21 NOTE — Telephone Encounter (Signed)
 Lmtcb

## 2023-10-21 NOTE — Telephone Encounter (Signed)
Refer to phone encounter. 

## 2023-10-21 NOTE — Telephone Encounter (Signed)
 Indication: Afib Goal INR: 2-3 INR therapeutic now at 2.4.  Recommendations:  Continue current regimen as outlined by PCP.

## 2023-10-21 NOTE — Telephone Encounter (Signed)
 lmtcb

## 2023-10-21 NOTE — Telephone Encounter (Signed)
 Fax received mdINR PT/INR self testing service Test date/time 10/21/23 12:42pm INR 2.4

## 2023-10-27 DIAGNOSIS — H2513 Age-related nuclear cataract, bilateral: Secondary | ICD-10-CM | POA: Diagnosis not present

## 2023-10-27 DIAGNOSIS — H353132 Nonexudative age-related macular degeneration, bilateral, intermediate dry stage: Secondary | ICD-10-CM | POA: Diagnosis not present

## 2023-10-27 DIAGNOSIS — E119 Type 2 diabetes mellitus without complications: Secondary | ICD-10-CM | POA: Diagnosis not present

## 2023-10-27 DIAGNOSIS — H11153 Pinguecula, bilateral: Secondary | ICD-10-CM | POA: Diagnosis not present

## 2023-10-27 LAB — HM DIABETES EYE EXAM

## 2023-10-28 ENCOUNTER — Telehealth: Payer: Self-pay | Admitting: *Deleted

## 2023-10-28 ENCOUNTER — Ambulatory Visit (INDEPENDENT_AMBULATORY_CARE_PROVIDER_SITE_OTHER): Admitting: Family Medicine

## 2023-10-28 DIAGNOSIS — Z86718 Personal history of other venous thrombosis and embolism: Secondary | ICD-10-CM

## 2023-10-28 DIAGNOSIS — D6859 Other primary thrombophilia: Secondary | ICD-10-CM

## 2023-10-28 DIAGNOSIS — I482 Chronic atrial fibrillation, unspecified: Secondary | ICD-10-CM | POA: Diagnosis not present

## 2023-10-28 LAB — POCT INR: INR: 3 (ref 2.0–3.0)

## 2023-10-28 NOTE — Progress Notes (Signed)
 INR 3.0, at goal, no changes to current regimen   Repeat INR in one week.

## 2023-10-28 NOTE — Telephone Encounter (Signed)
 Patient aware and verbalizes understanding.

## 2023-10-28 NOTE — Telephone Encounter (Signed)
 Fax received mdINR PT/INR self testing service Test date/time 10/28/23 734 am INR 3.0

## 2023-11-02 NOTE — Telephone Encounter (Signed)
 Me    10/28/23  1:51 PM Note Patient aware and verbalizes understanding.

## 2023-11-03 ENCOUNTER — Ambulatory Visit: Attending: Cardiology | Admitting: Cardiology

## 2023-11-03 ENCOUNTER — Encounter: Payer: Self-pay | Admitting: Cardiology

## 2023-11-03 VITALS — BP 124/78 | HR 83 | Ht 73.0 in | Wt 166.0 lb

## 2023-11-03 DIAGNOSIS — I4821 Permanent atrial fibrillation: Secondary | ICD-10-CM | POA: Diagnosis not present

## 2023-11-03 DIAGNOSIS — D6869 Other thrombophilia: Secondary | ICD-10-CM | POA: Diagnosis not present

## 2023-11-03 NOTE — Patient Instructions (Signed)
 Medication Instructions:  Your physician recommends that you continue on your current medications as directed. Please refer to the Current Medication list given to you today.  *If you need a refill on your cardiac medications before your next appointment, please call your pharmacy*  Testing/Procedures: Watchman Your physician has requested that you have Left atrial appendage (LAA) closure device implantation is a procedure to put a small device in the LAA of the heart. The LAA is a small sac in the wall of the heart's left upper chamber. Blood clots can form in this area. The device, Watchman closes the LAA to help prevent a blood clot and stroke.   Follow-Up: At [facility], you and your health needs are our priority.  As part of our continuing mission to provide you with exceptional heart care, our providers are all part of one team.  This team includes your primary Cardiologist (physician) and Advanced Practice Providers or APPs (Physician Assistants and Nurse Practitioners) who all work together to provide you with the care you need, when you need it.  Your next appointment:   Please give us  a call if you would like to proceed with scheduling

## 2023-11-03 NOTE — Progress Notes (Signed)
 Electrophysiology Office Note:   Date:  11/03/2023  ID:  MEMPHIS DECOTEAU, DOB Jul 07, 1950, MRN [id number]  Primary Cardiologist: Dorn Lesches, MD Electrophysiologist: Fonda Kitty, MD      History of Present Illness:   Christopher Avery is a 73 y.o. male with h/o hyperlipidemia, hypertension, diabetes, permanent atrial fibrillation seen today for evaluation for Watchman device at the request of Dr. Lesches.  Discussed the use of AI scribe software for clinical note transcription with the patient, who gave verbal consent to proceed.  History of Present Illness Christopher Avery is a 73 year old male with atrial fibrillation who presents for evaluation of the Watchman device. He is accompanied by his wife. He was referred by Dr. Lesches for evaluation of the Watchman device.  He has a long-standing history of atrial fibrillation for over fifteen years. Initially, he was aware of the episodes, but over time, he became asymptomatic despite being in atrial fibrillation continuously. He has been on warfarin for stroke prevention due to the risk associated with atrial fibrillation.  He reports no significant issues with bleeding while on warfarin, except for an incident where he cut a varicose vein, which required a trip to the ED and stitch to stop the bleeding. He monitors his INR weekly at home and generally maintains it within the therapeutic range, although there have been instances of subtherapeutic levels and supratherapeutic levels.   He is interested in learning more about the Watchman device, as his wife's brother had the procedure done successfully.  No new or acute complaints today.  Review of systems complete and found to be negative unless listed in HPI.   EP Information / Studies Reviewed:    EKG is not ordered today. EKG from 06/15/23 reviewed which showed atrial fibrillation.      Risk Assessment/Calculations:    CHA2DS2-VASc Score = 3   This indicates a 3.2% annual risk  of stroke. The patient's score is based upon: CHF History: 0 HTN History: 1 Diabetes History: 1 Stroke History: 0 Vascular Disease History: 0 Age Score: 1 Gender Score: 0             Physical Exam:   VS:  BP 124/78   Pulse 83   Ht 6' 1 (1.854 m)   Wt 166 lb (75.3 kg)   SpO2 98%   BMI 21.90 kg/m    Wt Readings from Last 3 Encounters:  11/03/23 166 lb (75.3 kg)  09/10/23 161 lb (73 kg)  06/15/23 161 lb (73 kg)     GEN: Well nourished, well developed in no acute distress NECK: No JVD CARDIAC: Normal rate, regular rhythm RESPIRATORY:  Clear to auscultation without rales, wheezing or rhonchi  ABDOMEN: Soft, non-distended EXTREMITIES:  No edema; No deformity   ASSESSMENT AND PLAN:   I have seen Christopher Avery in the office today who is being considered for a Watchman left atrial appendage closure device. I believe they will benefit from this procedure given their history of atrial fibrillation, CHA2DS2-VASc score of 3 and unadjusted ischemic stroke rate of 3.2% per year. Unfortunately, the patient is not felt to be a long term anticoagulation candidate secondary to bleeding requiring intervention and subtherapeutic INR's on warfarin. The patient's chart has been reviewed and I feel that they would be a candidate for short term oral anticoagulation after Watchman implant.   It is my belief that after undergoing a LAA closure procedure, [name] will not need long term anticoagulation which eliminates  anticoagulation side effects and major bleeding risk.   Procedural risks for the Watchman implant have been reviewed with the patient including a 0.5% risk of stroke, <1% risk of perforation and <1% risk of device embolization. Other risks include bleeding, vascular damage, tamponade, worsening renal function, and death. The patient understands these risk and wishes to proceed.     The published clinical data on the safety and effectiveness of WATCHMAN include but are not  limited to the following: - Holmes DR, Jess BEARD, Sick P et al. for the PROTECT AF Investigators. Percutaneous closure of the left atrial appendage versus warfarin therapy for prevention of stroke in patients with atrial fibrillation: a randomised non-inferiority trial. Lancet 2009; 374: 534-42. [place] Jess BEARD, Doshi SK, Jonita VEAR Satchel D et al. on behalf of the PROTECT AF Investigators. Percutaneous Left Atrial Appendage Closure for Stroke Prophylaxis in Patients With Atrial Fibrillation 2.3-Year Follow-up of the PROTECT AF (Watchman Left Atrial Appendage System for Embolic Protection in Patients With Atrial Fibrillation) Trial. Circulation 2013; 127:720-729. - Alli O, Doshi S,  Kar S, Reddy VY, Sievert H et al. Quality of Life Assessment in the Randomized PROTECT AF (Percutaneous Closure of the Left Atrial Appendage Versus Warfarin Therapy for Prevention of Stroke in Patients With Atrial Fibrillation) Trial of Patients at Risk for Stroke With Nonvalvular Atrial Fibrillation. J Am Coll Cardiol 2013; 61:1790-8. GLENWOOD Satchel DR, Archer RAMAN, Price M, Whisenant B, Sievert H, Doshi S, Huber K, Reddy V. Prospective randomized evaluation of the Watchman left atrial appendage Device in patients with atrial fibrillation versus long-term warfarin therapy; the PREVAIL trial. Journal of the [employer] of Cardiology, Vol. 4, No. 1, 2014, 1-11. - Kar S, Doshi SK, Sadhu A, Horton R, Osorio J et al. Primary outcome evaluation of a next-generation left atrial appendage closure device: results from the PINNACLE FLX trial. Circulation 2021;143(18)1754-1762.    After today's visit with the patient which was dedicated solely for shared decision making visit regarding LAA closure device, the patient would like to think about his options.  If he decides to proceed with watchman implant, then I would like to obtain a gated CT scan of the chest with contrast timed for PV/LA visualization. Additionally, the patient would need an updated  echocardiogram.  HAS-BLED score 4 Hypertension Yes  Abnormal renal and liver function (Dialysis, transplant, Cr >2.26 mg/dL /Cirrhosis or Bilirubin >2x Normal or AST/ALT/AP >3x Normal) No  Stroke No  Bleeding Yes  Labile INR (Unstable/high INR) Yes  Elderly (>65) Yes  Drugs or alcohol (>= 8 drinks/week, anti-plt or NSAID) No   CHA2DS2-VASc Score = 3  The patient's score is based upon: CHF History: 0 HTN History: 1 Diabetes History: 1 Stroke History: 0 Vascular Disease History: 0 Age Score: 1 Gender Score: 0       ASSESSMENT AND PLAN: Permanent Atrial Fibrillation (ICD10:  I48.11) The patient's CHA2DS2-VASc score is 3, indicating a 3.2% annual risk of stroke.   Continue diltiazem  240 mg once daily  Secondary Hypercoagulable State (ICD10:  D68.69) The patient is at significant risk for stroke/thromboembolism based upon his CHA2DS2-VASc Score of 3.  Continue Warfarin (Coumadin ).    Follow up with Dr. Kennyth if Watchman device is pursued.   Signed, Fonda Kennyth, MD

## 2023-11-04 ENCOUNTER — Ambulatory Visit (INDEPENDENT_AMBULATORY_CARE_PROVIDER_SITE_OTHER): Admitting: Family Medicine

## 2023-11-04 ENCOUNTER — Telehealth: Payer: Self-pay | Admitting: *Deleted

## 2023-11-04 DIAGNOSIS — I482 Chronic atrial fibrillation, unspecified: Secondary | ICD-10-CM | POA: Diagnosis not present

## 2023-11-04 DIAGNOSIS — D6859 Other primary thrombophilia: Secondary | ICD-10-CM

## 2023-11-04 DIAGNOSIS — Z86718 Personal history of other venous thrombosis and embolism: Secondary | ICD-10-CM

## 2023-11-04 LAB — POCT INR: INR: 2.4 (ref 2.0–3.0)

## 2023-11-04 NOTE — Telephone Encounter (Signed)
 Reviewed result with patient and patient voiced understanding. Patient is scheduled to have 1 wk INR check on 11/10/23.

## 2023-11-04 NOTE — Progress Notes (Signed)
 INR 2.4, at goal, no changes to current regimen   Repeat INR in one week.

## 2023-11-04 NOTE — Telephone Encounter (Signed)
 Fax received mdINR PT/INR self testing service Test date/time 11/04/23 750 am INR 2.4

## 2023-11-06 ENCOUNTER — Telehealth: Payer: Self-pay

## 2023-11-06 NOTE — Telephone Encounter (Signed)
 The patient saw Dr. Kennyth 11/02/3033 to discuss Watchman procedure and left unsure if proceeding. Called to check in to see if he thought about it.  Left message to call back if he wishes to proceed. If he does, will arrange CT and echo per Dr. Shaune note.

## 2023-11-10 ENCOUNTER — Other Ambulatory Visit

## 2023-11-10 DIAGNOSIS — D6859 Other primary thrombophilia: Secondary | ICD-10-CM

## 2023-11-11 ENCOUNTER — Telehealth: Payer: Self-pay | Admitting: *Deleted

## 2023-11-11 ENCOUNTER — Ambulatory Visit (INDEPENDENT_AMBULATORY_CARE_PROVIDER_SITE_OTHER): Admitting: Family Medicine

## 2023-11-11 DIAGNOSIS — I482 Chronic atrial fibrillation, unspecified: Secondary | ICD-10-CM | POA: Diagnosis not present

## 2023-11-11 DIAGNOSIS — Z86718 Personal history of other venous thrombosis and embolism: Secondary | ICD-10-CM

## 2023-11-11 DIAGNOSIS — D6859 Other primary thrombophilia: Secondary | ICD-10-CM

## 2023-11-11 LAB — POCT INR: INR: 2.5 (ref 2.0–3.0)

## 2023-11-11 NOTE — Telephone Encounter (Signed)
 Fax received mdINR PT/INR self testing service Test date/time 11/11/23 744 am INR 2.5

## 2023-11-11 NOTE — Progress Notes (Signed)
 INR 2.5, at goal, no changes to current regimen   Repeat INR in one week.

## 2023-11-11 NOTE — Telephone Encounter (Signed)
 Reviewed result with patient and he voiced understanding.

## 2023-11-18 ENCOUNTER — Ambulatory Visit (INDEPENDENT_AMBULATORY_CARE_PROVIDER_SITE_OTHER): Admitting: Family Medicine

## 2023-11-18 ENCOUNTER — Telehealth: Payer: Self-pay | Admitting: *Deleted

## 2023-11-18 DIAGNOSIS — I482 Chronic atrial fibrillation, unspecified: Secondary | ICD-10-CM

## 2023-11-18 DIAGNOSIS — D6859 Other primary thrombophilia: Secondary | ICD-10-CM

## 2023-11-18 DIAGNOSIS — Z7901 Long term (current) use of anticoagulants: Secondary | ICD-10-CM | POA: Diagnosis not present

## 2023-11-18 DIAGNOSIS — Z86718 Personal history of other venous thrombosis and embolism: Secondary | ICD-10-CM

## 2023-11-18 DIAGNOSIS — I4821 Permanent atrial fibrillation: Secondary | ICD-10-CM | POA: Diagnosis not present

## 2023-11-18 LAB — POCT INR: INR: 2.3 (ref 2.0–3.0)

## 2023-11-18 NOTE — Progress Notes (Signed)
 INR 2.3, at goal, no changes to current regimen   Repeat INR in one week.

## 2023-11-18 NOTE — Telephone Encounter (Signed)
 Patient aware and verbalizes understanding.

## 2023-11-18 NOTE — Telephone Encounter (Signed)
 Fax received mdINR PT/INR self testing service Test date/time 11/18/23 742 am INR 2.3

## 2023-11-25 ENCOUNTER — Ambulatory Visit (INDEPENDENT_AMBULATORY_CARE_PROVIDER_SITE_OTHER): Admitting: Family Medicine

## 2023-11-25 ENCOUNTER — Telehealth: Payer: Self-pay | Admitting: *Deleted

## 2023-11-25 DIAGNOSIS — Z86718 Personal history of other venous thrombosis and embolism: Secondary | ICD-10-CM

## 2023-11-25 DIAGNOSIS — I482 Chronic atrial fibrillation, unspecified: Secondary | ICD-10-CM

## 2023-11-25 DIAGNOSIS — D6859 Other primary thrombophilia: Secondary | ICD-10-CM

## 2023-11-25 LAB — POCT INR: INR: 2.4 (ref 2.0–3.0)

## 2023-11-25 NOTE — Telephone Encounter (Signed)
 Lmtcb  Review results when patient calls back

## 2023-11-25 NOTE — Progress Notes (Signed)
 INR 2.4, at goal, no changes to current regimen   Repeat INR in one week.

## 2023-11-25 NOTE — Telephone Encounter (Signed)
 Fax received mdINR PT/INR self testing service Test date/time 11/25/23 805 am INR 2.4

## 2023-11-26 NOTE — Telephone Encounter (Signed)
 Contacted patient. Notified patient. Patient verbalized understanding.

## 2023-12-02 ENCOUNTER — Telehealth: Payer: Self-pay | Admitting: *Deleted

## 2023-12-02 ENCOUNTER — Ambulatory Visit (INDEPENDENT_AMBULATORY_CARE_PROVIDER_SITE_OTHER): Admitting: Family Medicine

## 2023-12-02 DIAGNOSIS — D6859 Other primary thrombophilia: Secondary | ICD-10-CM

## 2023-12-02 DIAGNOSIS — Z86718 Personal history of other venous thrombosis and embolism: Secondary | ICD-10-CM

## 2023-12-02 DIAGNOSIS — I482 Chronic atrial fibrillation, unspecified: Secondary | ICD-10-CM

## 2023-12-02 LAB — POCT INR: INR: 1.9 — AB (ref 2.0–3.0)

## 2023-12-02 NOTE — Progress Notes (Signed)
 INR 1.9, not at goal. What is current dosing? Need to adjust as he is not at goal.   Repeat INR in one week.

## 2023-12-02 NOTE — Telephone Encounter (Signed)
 Fax received mdINR PT/INR self testing service Test date/time 12/02/23 808 am INR 1.9

## 2023-12-02 NOTE — Telephone Encounter (Signed)
 Spoke to wife and she states last week he took 1 tablet every night except two nights a week he took 1 and a half tablet.  States he did have some greens.   She has already fixed his pills for the week and she plans to give him - 1 1/2 tablet tonight.  Saturday 2 tablets  Tuesday 11/4- 1 1/2 tablets   I advised her I would call her if you do not agree with her dosage for he week.

## 2023-12-02 NOTE — Telephone Encounter (Signed)
 Patient aware and verbalizes understanding.  States that he does not want to the watchaman device at this time

## 2023-12-10 ENCOUNTER — Telehealth: Payer: Self-pay | Admitting: *Deleted

## 2023-12-10 ENCOUNTER — Ambulatory Visit (INDEPENDENT_AMBULATORY_CARE_PROVIDER_SITE_OTHER): Admitting: Family Medicine

## 2023-12-10 DIAGNOSIS — I482 Chronic atrial fibrillation, unspecified: Secondary | ICD-10-CM

## 2023-12-10 DIAGNOSIS — Z86718 Personal history of other venous thrombosis and embolism: Secondary | ICD-10-CM

## 2023-12-10 DIAGNOSIS — D6859 Other primary thrombophilia: Secondary | ICD-10-CM

## 2023-12-10 LAB — POCT INR: INR: 3.2 — AB (ref 2.0–3.0)

## 2023-12-10 NOTE — Telephone Encounter (Signed)
Patient aware of INR results

## 2023-12-10 NOTE — Telephone Encounter (Signed)
 Fax received mdINR PT/INR self testing service Test date/time 12/09/23 426 pm INR 3.2

## 2023-12-10 NOTE — Progress Notes (Signed)
 INR 3.2 not at goal. Too thin, need to cut dosing to 5 mg tonight and then resume regular dosing.   Repeat INR in one week.

## 2023-12-11 ENCOUNTER — Other Ambulatory Visit: Payer: Self-pay | Admitting: *Deleted

## 2023-12-11 DIAGNOSIS — E1169 Type 2 diabetes mellitus with other specified complication: Secondary | ICD-10-CM

## 2023-12-16 ENCOUNTER — Ambulatory Visit (INDEPENDENT_AMBULATORY_CARE_PROVIDER_SITE_OTHER): Admitting: Family Medicine

## 2023-12-16 ENCOUNTER — Telehealth: Payer: Self-pay | Admitting: *Deleted

## 2023-12-16 DIAGNOSIS — I482 Chronic atrial fibrillation, unspecified: Secondary | ICD-10-CM | POA: Diagnosis not present

## 2023-12-16 DIAGNOSIS — Z86718 Personal history of other venous thrombosis and embolism: Secondary | ICD-10-CM

## 2023-12-16 DIAGNOSIS — D6859 Other primary thrombophilia: Secondary | ICD-10-CM

## 2023-12-16 DIAGNOSIS — I4821 Permanent atrial fibrillation: Secondary | ICD-10-CM | POA: Diagnosis not present

## 2023-12-16 DIAGNOSIS — Z7901 Long term (current) use of anticoagulants: Secondary | ICD-10-CM | POA: Diagnosis not present

## 2023-12-16 LAB — POCT INR: INR: 2.8 (ref 2.0–3.0)

## 2023-12-16 NOTE — Telephone Encounter (Signed)
 Fax received mdINR PT/INR self testing service Test date/time 12/16/23 742 am INR 2.8

## 2023-12-16 NOTE — Telephone Encounter (Signed)
 Patient aware and verbalizes understanding.

## 2023-12-16 NOTE — Progress Notes (Signed)
 INR 2.8, at goal, continue current regimen.   Repeat INR in one week.

## 2023-12-22 ENCOUNTER — Telehealth: Payer: Self-pay | Admitting: Pharmacist

## 2023-12-22 DIAGNOSIS — E1169 Type 2 diabetes mellitus with other specified complication: Secondary | ICD-10-CM

## 2023-12-22 MED ORDER — ATORVASTATIN CALCIUM 20 MG PO TABS: 20.0000 mg | ORAL_TABLET | ORAL | 1 refills | Status: AC

## 2023-12-22 NOTE — Telephone Encounter (Signed)
   This patient is appearing on a report for being at risk of failing the adherence measure for cholesterol (statin) medications this calendar year.   Medication: atorvastatin  20mg  Last fill date: 01/2023 Patient is only taking every other day Updated RX sent in for every other day dosing  Contacted pharmacy to facilitate refills. and Will collaborate with provider to facilitate refill needs.   Christopher Avery Christopher Avery, PharmD, BCACP, CPP Clinical Pharmacist, [facility]

## 2023-12-23 ENCOUNTER — Telehealth: Payer: Self-pay | Admitting: *Deleted

## 2023-12-23 ENCOUNTER — Ambulatory Visit (INDEPENDENT_AMBULATORY_CARE_PROVIDER_SITE_OTHER): Admitting: Family Medicine

## 2023-12-23 DIAGNOSIS — Z86718 Personal history of other venous thrombosis and embolism: Secondary | ICD-10-CM

## 2023-12-23 DIAGNOSIS — D6859 Other primary thrombophilia: Secondary | ICD-10-CM

## 2023-12-23 DIAGNOSIS — I482 Chronic atrial fibrillation, unspecified: Secondary | ICD-10-CM | POA: Diagnosis not present

## 2023-12-23 LAB — POCT INR: INR: 2.5 (ref 2.0–3.0)

## 2023-12-23 NOTE — Telephone Encounter (Signed)
 Patient aware

## 2023-12-23 NOTE — Telephone Encounter (Signed)
 Fax received mdINR PT/INR self testing service Test date/time 12/23/23 658 am INR 2.5

## 2023-12-23 NOTE — Progress Notes (Signed)
 INR 2.5, at goal, continue current regimen.   Repeat INR in one week.

## 2023-12-30 ENCOUNTER — Ambulatory Visit (INDEPENDENT_AMBULATORY_CARE_PROVIDER_SITE_OTHER): Payer: Self-pay | Admitting: Family Medicine

## 2023-12-30 ENCOUNTER — Telehealth: Payer: Self-pay | Admitting: *Deleted

## 2023-12-30 DIAGNOSIS — D6859 Other primary thrombophilia: Secondary | ICD-10-CM

## 2023-12-30 DIAGNOSIS — I482 Chronic atrial fibrillation, unspecified: Secondary | ICD-10-CM | POA: Diagnosis not present

## 2023-12-30 DIAGNOSIS — Z86718 Personal history of other venous thrombosis and embolism: Secondary | ICD-10-CM

## 2023-12-30 LAB — POCT INR: INR: 2.3 (ref 2.0–3.0)

## 2023-12-30 NOTE — Telephone Encounter (Signed)
 Lmtcb  Review results when patient calls back

## 2023-12-30 NOTE — Progress Notes (Signed)
 INR 2.3, at goal, continue current regimen.   Repeat INR in one week.

## 2023-12-30 NOTE — Telephone Encounter (Signed)
 Called patient and told him his INR was good and no changes.

## 2023-12-30 NOTE — Telephone Encounter (Signed)
 Fax received mdINR PT/INR self testing service Test date/time 12/30/23 809 am INR 2.3

## 2024-01-06 ENCOUNTER — Ambulatory Visit (INDEPENDENT_AMBULATORY_CARE_PROVIDER_SITE_OTHER): Admitting: Family Medicine

## 2024-01-06 ENCOUNTER — Telehealth: Payer: Self-pay | Admitting: *Deleted

## 2024-01-06 DIAGNOSIS — D6859 Other primary thrombophilia: Secondary | ICD-10-CM

## 2024-01-06 DIAGNOSIS — Z86718 Personal history of other venous thrombosis and embolism: Secondary | ICD-10-CM

## 2024-01-06 DIAGNOSIS — I482 Chronic atrial fibrillation, unspecified: Secondary | ICD-10-CM

## 2024-01-06 LAB — POCT INR: INR: 2.2 (ref 2.0–3.0)

## 2024-01-06 NOTE — Telephone Encounter (Signed)
 Patient aware and verbalizes understanding.

## 2024-01-06 NOTE — Telephone Encounter (Signed)
 Fax received mdINR PT/INR self testing service Test date/time 01/05/22 719 am INR 2.2

## 2024-01-06 NOTE — Progress Notes (Signed)
 INR 2.2, at goal, continue current regimen.   Repeat INR in one week.

## 2024-01-13 ENCOUNTER — Telehealth: Payer: Self-pay | Admitting: *Deleted

## 2024-01-13 ENCOUNTER — Ambulatory Visit (INDEPENDENT_AMBULATORY_CARE_PROVIDER_SITE_OTHER): Admitting: Family Medicine

## 2024-01-13 DIAGNOSIS — Z7901 Long term (current) use of anticoagulants: Secondary | ICD-10-CM | POA: Diagnosis not present

## 2024-01-13 DIAGNOSIS — I482 Chronic atrial fibrillation, unspecified: Secondary | ICD-10-CM

## 2024-01-13 DIAGNOSIS — I4821 Permanent atrial fibrillation: Secondary | ICD-10-CM | POA: Diagnosis not present

## 2024-01-13 DIAGNOSIS — D6859 Other primary thrombophilia: Secondary | ICD-10-CM

## 2024-01-13 DIAGNOSIS — Z86718 Personal history of other venous thrombosis and embolism: Secondary | ICD-10-CM

## 2024-01-13 LAB — POCT INR: INR: 1.6 — AB (ref 2.0–3.0)

## 2024-01-13 NOTE — Progress Notes (Signed)
 INR 1.6, not at goal, a little thick. Take an extra 2.5 mg tonight then resume normal regimen.   Repeat INR in one week.

## 2024-01-13 NOTE — Telephone Encounter (Signed)
 Patient aware and verbalizes understanding.

## 2024-01-13 NOTE — Telephone Encounter (Signed)
 Fax received mdINR PT/INR self testing service Test date/time 01/13/24 724 am INR 1.6

## 2024-01-20 ENCOUNTER — Telehealth: Payer: Self-pay | Admitting: *Deleted

## 2024-01-20 ENCOUNTER — Ambulatory Visit (INDEPENDENT_AMBULATORY_CARE_PROVIDER_SITE_OTHER): Admitting: Family Medicine

## 2024-01-20 DIAGNOSIS — Z86718 Personal history of other venous thrombosis and embolism: Secondary | ICD-10-CM

## 2024-01-20 DIAGNOSIS — D6859 Other primary thrombophilia: Secondary | ICD-10-CM

## 2024-01-20 DIAGNOSIS — I482 Chronic atrial fibrillation, unspecified: Secondary | ICD-10-CM

## 2024-01-20 LAB — POCT INR: INR: 2.1 (ref 2.0–3.0)

## 2024-01-20 NOTE — Telephone Encounter (Signed)
 Fax received mdINR PT/INR self testing service Test date/time 01/20/24 747 am INR 2.1

## 2024-01-20 NOTE — Telephone Encounter (Signed)
 Patient aware and verbalizes understanding.

## 2024-01-20 NOTE — Progress Notes (Signed)
 INR 2.1, back to goal, continue current regimen.   Repeat INR in one week.

## 2024-01-27 ENCOUNTER — Telehealth: Payer: Self-pay

## 2024-01-27 NOTE — Telephone Encounter (Signed)
 Pts home INR was 2.0 today  INR Range is 2-3  INR Notification Range Below 1.4 or above 5  Pt has been on blood thinner since 02/24/2022

## 2024-01-27 NOTE — Telephone Encounter (Signed)
 Left message of voicemail.LS

## 2024-01-27 NOTE — Telephone Encounter (Signed)
Continue coumadin as is °

## 2024-02-04 ENCOUNTER — Other Ambulatory Visit: Payer: Self-pay | Admitting: Family Medicine

## 2024-02-05 ENCOUNTER — Ambulatory Visit (INDEPENDENT_AMBULATORY_CARE_PROVIDER_SITE_OTHER): Admitting: Family Medicine

## 2024-02-05 ENCOUNTER — Telehealth: Payer: Self-pay | Admitting: Family Medicine

## 2024-02-05 DIAGNOSIS — Z86718 Personal history of other venous thrombosis and embolism: Secondary | ICD-10-CM

## 2024-02-05 DIAGNOSIS — I482 Chronic atrial fibrillation, unspecified: Secondary | ICD-10-CM

## 2024-02-05 DIAGNOSIS — D6859 Other primary thrombophilia: Secondary | ICD-10-CM

## 2024-02-05 LAB — POCT INR: INR: 2.2 (ref 2.0–3.0)

## 2024-02-05 NOTE — Telephone Encounter (Signed)
 Patient seen in clinic 02/05/2024.

## 2024-02-05 NOTE — Telephone Encounter (Signed)
 Contacted patient. Notified patient. Patient verbalized understanding.

## 2024-02-05 NOTE — Telephone Encounter (Signed)
 Fax received mdINR PT/INR self testing service Test date/time 02/04/2024 8:07 INR 2.2

## 2024-02-05 NOTE — Progress Notes (Signed)
 INR 2.2, at goal, continue current regimen.   Repeat INR in one week.

## 2024-02-10 ENCOUNTER — Ambulatory Visit (INDEPENDENT_AMBULATORY_CARE_PROVIDER_SITE_OTHER): Admitting: Family Medicine

## 2024-02-10 ENCOUNTER — Telehealth: Payer: Self-pay | Admitting: *Deleted

## 2024-02-10 DIAGNOSIS — I482 Chronic atrial fibrillation, unspecified: Secondary | ICD-10-CM | POA: Diagnosis not present

## 2024-02-10 DIAGNOSIS — Z86718 Personal history of other venous thrombosis and embolism: Secondary | ICD-10-CM

## 2024-02-10 DIAGNOSIS — D6859 Other primary thrombophilia: Secondary | ICD-10-CM

## 2024-02-10 LAB — POCT INR: INR: 2.3 (ref 2.0–3.0)

## 2024-02-10 NOTE — Telephone Encounter (Signed)
 Patient aware and verbalizes understanding.

## 2024-02-10 NOTE — Telephone Encounter (Signed)
 Fax received mdINR PT/INR self testing service Test date/time 02/10/24 858 am INR 2.3

## 2024-02-10 NOTE — Progress Notes (Signed)
 INR 2.3, at goal, continue current regimen.   Repeat INR in one week.

## 2024-02-17 ENCOUNTER — Ambulatory Visit (INDEPENDENT_AMBULATORY_CARE_PROVIDER_SITE_OTHER): Admitting: Family Medicine

## 2024-02-17 ENCOUNTER — Telehealth: Payer: Self-pay | Admitting: *Deleted

## 2024-02-17 DIAGNOSIS — Z86718 Personal history of other venous thrombosis and embolism: Secondary | ICD-10-CM

## 2024-02-17 DIAGNOSIS — I482 Chronic atrial fibrillation, unspecified: Secondary | ICD-10-CM

## 2024-02-17 DIAGNOSIS — D6859 Other primary thrombophilia: Secondary | ICD-10-CM

## 2024-02-17 LAB — POCT INR: INR: 2.2 (ref 2.0–3.0)

## 2024-02-17 NOTE — Progress Notes (Signed)
 INR 2.2, at goal, continue current regimen.   Repeat INR in one week.

## 2024-02-17 NOTE — Telephone Encounter (Signed)
 Reviewed results with patients wife and she voiced understanding.

## 2024-02-17 NOTE — Telephone Encounter (Signed)
 Fax received mdINR PT/INR self testing service Test date/time 02/17/24 909 am INR 2.2

## 2024-02-25 ENCOUNTER — Ambulatory Visit (INDEPENDENT_AMBULATORY_CARE_PROVIDER_SITE_OTHER): Admitting: Family Medicine

## 2024-02-25 ENCOUNTER — Telehealth: Payer: Self-pay | Admitting: *Deleted

## 2024-02-25 DIAGNOSIS — D6859 Other primary thrombophilia: Secondary | ICD-10-CM

## 2024-02-25 DIAGNOSIS — I482 Chronic atrial fibrillation, unspecified: Secondary | ICD-10-CM | POA: Diagnosis not present

## 2024-02-25 DIAGNOSIS — Z86718 Personal history of other venous thrombosis and embolism: Secondary | ICD-10-CM

## 2024-02-25 LAB — POCT INR: INR: 2.3 (ref 2.0–3.0)

## 2024-02-25 NOTE — Progress Notes (Signed)
 INR 2.3, at goal, continue current regimen.   Repeat INR in one week.

## 2024-02-25 NOTE — Telephone Encounter (Signed)
 Fax received mdINR PT/INR self testing service Test date/time 02/25/24 848 am INR 2.3

## 2024-03-02 ENCOUNTER — Telehealth: Payer: Self-pay | Admitting: *Deleted

## 2024-03-02 DIAGNOSIS — I482 Chronic atrial fibrillation, unspecified: Secondary | ICD-10-CM

## 2024-03-02 DIAGNOSIS — D6859 Other primary thrombophilia: Secondary | ICD-10-CM

## 2024-03-02 DIAGNOSIS — Z86718 Personal history of other venous thrombosis and embolism: Secondary | ICD-10-CM

## 2024-03-02 NOTE — Telephone Encounter (Signed)
 Fax received mdINR PT/INR self testing service Test date/time 03/02/24 244 pm INR 1.9

## 2024-03-03 ENCOUNTER — Ambulatory Visit (INDEPENDENT_AMBULATORY_CARE_PROVIDER_SITE_OTHER): Admitting: Family Medicine

## 2024-03-03 DIAGNOSIS — Z86718 Personal history of other venous thrombosis and embolism: Secondary | ICD-10-CM

## 2024-03-03 DIAGNOSIS — I482 Chronic atrial fibrillation, unspecified: Secondary | ICD-10-CM

## 2024-03-03 DIAGNOSIS — D6859 Other primary thrombophilia: Secondary | ICD-10-CM

## 2024-03-03 LAB — POCT INR: INR: 1.9 — AB (ref 2.0–3.0)

## 2024-03-03 NOTE — Telephone Encounter (Signed)
 Patient aware and verbalizes understanding.

## 2024-03-03 NOTE — Progress Notes (Signed)
 INR 1.9, not at goal, 0.1 off. Continue current regimen and repeat in one week.   Repeat INR in one week.

## 2024-03-09 ENCOUNTER — Ambulatory Visit (INDEPENDENT_AMBULATORY_CARE_PROVIDER_SITE_OTHER): Admitting: Family Medicine

## 2024-03-09 ENCOUNTER — Telehealth: Payer: Self-pay | Admitting: *Deleted

## 2024-03-09 DIAGNOSIS — Z86718 Personal history of other venous thrombosis and embolism: Secondary | ICD-10-CM

## 2024-03-09 DIAGNOSIS — D6859 Other primary thrombophilia: Secondary | ICD-10-CM

## 2024-03-09 DIAGNOSIS — I482 Chronic atrial fibrillation, unspecified: Secondary | ICD-10-CM | POA: Diagnosis not present

## 2024-03-09 LAB — POCT INR: INR: 1.7 — AB (ref 2.0–3.0)

## 2024-03-09 NOTE — Telephone Encounter (Signed)
"  Lmtcb.  "

## 2024-03-09 NOTE — Telephone Encounter (Signed)
 Fax received mdINR PT/INR self testing service Test date/time 03/09/24 900 am INR 1.7

## 2024-03-09 NOTE — Progress Notes (Addendum)
 INR 1.7, not at goal. Increase dosing by a 1/2 tablet tonight and then  continue current regimen and repeat in one week.   Repeat INR in one week.

## 2024-03-10 ENCOUNTER — Other Ambulatory Visit: Payer: Self-pay | Admitting: Family Medicine

## 2024-03-10 DIAGNOSIS — E1169 Type 2 diabetes mellitus with other specified complication: Secondary | ICD-10-CM

## 2024-03-10 MED ORDER — SILDENAFIL CITRATE 100 MG PO TABS: 50.0000 mg | ORAL_TABLET | ORAL | 0 refills | Status: AC | PRN

## 2024-03-10 NOTE — Telephone Encounter (Signed)
 Copied from CRM #[id number]. Topic: Clinical - Medication Refill >> Mar 10, 2024  8:56 AM Christopher Avery wrote: Medication:  sildenafil  (VIAGRA ) 100 MG tablet  Has the patient contacted their pharmacy? Yes (Agent: If no, request that the patient contact the pharmacy for the refill. If patient does not wish to contact the pharmacy document the reason why and proceed with request.) (Agent: If yes, when and what did the pharmacy advise?)  This is the patient's preferred pharmacy:   [facility], [place] - [address] [place] [address] Phone: [phone] Fax: [phone]  Is this the correct pharmacy for this prescription? Yes If no, delete pharmacy and type the correct one.   Has the prescription been filled recently? Yes  Is the patient out of the medication? Yes  Has the patient been seen for an appointment in the last year OR does the patient have an upcoming appointment? Yes  Can we respond through MyChart? Yes  Agent: Please be advised that Rx refills may take up to 3 business days. We ask that you follow-up with your pharmacy.

## 2024-03-10 NOTE — Telephone Encounter (Signed)
 Contacted patient. Notified patient. Patient verbalized understanding.

## 2024-09-12 ENCOUNTER — Ambulatory Visit

## 2024-09-13 ENCOUNTER — Ambulatory Visit: Payer: Self-pay
